# Patient Record
Sex: Female | Born: 1940 | Race: White | Hispanic: No | Marital: Married | Smoking: Never smoker
Health system: Southern US, Community
[De-identification: ages and names within clinical notes are randomized; demographics above are authoritative.]

## PROBLEM LIST (undated history)

## (undated) DIAGNOSIS — D649 Anemia, unspecified: Secondary | ICD-10-CM

## (undated) DIAGNOSIS — E785 Hyperlipidemia, unspecified: Secondary | ICD-10-CM

## (undated) DIAGNOSIS — I872 Venous insufficiency (chronic) (peripheral): Secondary | ICD-10-CM

## (undated) DIAGNOSIS — I10 Essential (primary) hypertension: Secondary | ICD-10-CM

## (undated) DIAGNOSIS — F329 Major depressive disorder, single episode, unspecified: Secondary | ICD-10-CM

## (undated) DIAGNOSIS — Z8619 Personal history of other infectious and parasitic diseases: Secondary | ICD-10-CM

## (undated) DIAGNOSIS — E039 Hypothyroidism, unspecified: Secondary | ICD-10-CM

## (undated) DIAGNOSIS — J449 Chronic obstructive pulmonary disease, unspecified: Secondary | ICD-10-CM

## (undated) DIAGNOSIS — M1990 Unspecified osteoarthritis, unspecified site: Secondary | ICD-10-CM

## (undated) DIAGNOSIS — M5430 Sciatica, unspecified side: Secondary | ICD-10-CM

## (undated) DIAGNOSIS — K219 Gastro-esophageal reflux disease without esophagitis: Secondary | ICD-10-CM

## (undated) DIAGNOSIS — G4733 Obstructive sleep apnea (adult) (pediatric): Secondary | ICD-10-CM

## (undated) DIAGNOSIS — I509 Heart failure, unspecified: Secondary | ICD-10-CM

## (undated) DIAGNOSIS — H409 Unspecified glaucoma: Secondary | ICD-10-CM

## (undated) DIAGNOSIS — E119 Type 2 diabetes mellitus without complications: Secondary | ICD-10-CM

## (undated) DIAGNOSIS — N189 Chronic kidney disease, unspecified: Secondary | ICD-10-CM

## (undated) DIAGNOSIS — J45909 Unspecified asthma, uncomplicated: Secondary | ICD-10-CM

## (undated) DIAGNOSIS — I499 Cardiac arrhythmia, unspecified: Secondary | ICD-10-CM

## (undated) DIAGNOSIS — F32A Depression, unspecified: Secondary | ICD-10-CM

## (undated) HISTORY — PX: BREAST BIOPSY: SHX20

---

## 2004-03-20 ENCOUNTER — Emergency Department: Payer: Self-pay | Admitting: Emergency Medicine

## 2006-12-31 ENCOUNTER — Emergency Department: Payer: Self-pay | Admitting: Emergency Medicine

## 2008-01-21 ENCOUNTER — Ambulatory Visit: Payer: Self-pay | Admitting: Surgery

## 2009-08-19 ENCOUNTER — Ambulatory Visit: Payer: Self-pay | Admitting: Ophthalmology

## 2011-08-01 ENCOUNTER — Ambulatory Visit: Payer: Self-pay | Admitting: Specialist

## 2013-01-16 ENCOUNTER — Ambulatory Visit: Payer: Self-pay | Admitting: Family

## 2014-02-19 ENCOUNTER — Ambulatory Visit: Payer: Self-pay | Admitting: Family

## 2014-02-24 ENCOUNTER — Ambulatory Visit: Payer: Self-pay | Admitting: Family

## 2014-06-24 DIAGNOSIS — Z8619 Personal history of other infectious and parasitic diseases: Secondary | ICD-10-CM

## 2014-06-24 HISTORY — DX: Personal history of other infectious and parasitic diseases: Z86.19

## 2014-06-30 ENCOUNTER — Ambulatory Visit: Payer: Self-pay | Admitting: Family Medicine

## 2014-07-15 ENCOUNTER — Inpatient Hospital Stay: Payer: Self-pay | Admitting: Internal Medicine

## 2014-07-18 LAB — CLOSTRIDIUM DIFFICILE(ARMC)

## 2014-07-19 LAB — STOOL CULTURE

## 2014-08-24 NOTE — Discharge Summary (Signed)
PATIENT NAME:  Laurie Ross, Laurie Ross MR#:  [id number] DATE OF BIRTH:  01-18-1941  DATE OF ADMISSION:  07/15/2014 DATE OF DISCHARGE:  07/17/2014  DISCHARGE DIAGNOSES:  1.  Clostridium difficile colitis. 2.  Intractable nausea and vomiting.   SECONDARY DIAGNOSES:   diabetes, hypertension, hyperlipidemia, history of chronic diastolic CHF, history of chronic kidney disease stage III, glaucoma, hypothyroidism, COPD, obesity, obstructive sleep apnea, hypothyroidism.   CONSULTATIONS: None.   PROCEDURES AND RADIOLOGY: None.   HISTORY AND SHORT HOSPITAL COURSE: The patient is a 74 year old female with the above-mentioned medical problems who was admitted for intractable nausea, vomiting and diarrhea.  Her diarrhea was known to be from C. difficile colitis for which she was being treated as an outpatient with oral Flagyl, but due to her nausea or vomiting she was not able to tolerate p.o. medicine and she was admitted. The patient was switched over to IV antibiotic and subsequently was switched to p.o. vancomycin as she was able to tolerate oral medications and diet. The patient has been tolerating diet. Her nausea and vomiting has been resolved and she is being discharged to rehab facility in stable condition. Her diarrhea seemed to be getting better. Her frequency is slowing down and so is her consistency.  The last consistency she reports is semiliquid to semisolid and she has had about 2 to 3 semiformed stools this morning. She is able to tolerate diet and is being discharged to rehab in stable condition.   VITAL SIGNS: On the date of discharge, her vital signs were as follows: Temperature 98.2, heart rate 86 per minute, respirations 20 per minute, blood pressure 124/64. She is saturating 91% on room air.   PERTINENT PHYSICAL EXAMINATION ON THE DATE OF DISCHARGE: CARDIOVASCULAR: S1, S2 normal. No murmurs, rubs, or gallop.  LUNGS: Clear to auscultation bilaterally. No wheezing, rales, rhonchi, crepitation.   ABDOMEN: Soft, benign.  NEUROLOGIC: Nonfocal examination.  All other physical examination remained at baseline.   DISCHARGE MEDICATIONS:   Medication Instructions  xalatan ophthalmic 0.005% solution  1 gtt drops to both eyes once a day (at bedtime)    cosopt ophthalmic solution  1 gtt drops to both eyes  2 times a day    aspirin 81 mg oral tablet ec  1 tab(s)  once a day    metformin 500 mg oral tablet  1 tab(s) orally 2 times a day    lasix 20 mg oral tablet  1  orally  every 2-3 days PRN     sertraline 100 mg oral tablet  1.5 tab(s) orally once a day (at bedtime)    lovastatin 20 mg oral tablet  1 tab(s) orally once a day (at bedtime)    hydrochlorothiazide 25 mg oral tablet  1 tab(s) orally once a day    hydrocortisone 1% cream  1 dose  2 times a day to ears and neck   nizoral 2% shampoo  1 app. twice weekly   novolin 70/30  Subcutaneously  every morning   novolin 70/30  Every evening   terconazole 0.4% vaginal cream  To belly button skin fold   vancomycin 250 mg/5 ml oral solution  10 milliliter(s) orally every 6 hours x 12 days    DISCHARGE DIET: Full liquid and advance as tolerated to a diabetic diet.   DISCHARGE INSTRUCTIONS AND FOLLOWUP:  The patient was instructed to follow up with her primary care physician Dr. Hinda Lenis in 1-2 weeks. She will need followup with [facility] in 2  to 3 weeks.   TOTAL TIME SPENT ON THIS PATIENT:  45 minutes.   Please note, the patient's blood pressure medication has been on hold including some other home medication as the patient's blood pressure has been running low to normal range, and she would not require this right now.  I have also held her Nexium considering her diarrhea which could be a side effect. She remains at high risk for readmission.   ____________________________ Ellamae SiaVipul S. Sherryll BurgerShah, MD vss:sp D: 07/17/2014 10:55:22 ET T: 07/17/2014 11:20:13 ET JOB#: [id number]  cc: Natajah Derderian S. Sherryll BurgerShah, MD, <Dictator> Dr. Hinda LenisSharon  [facility] Ellamae SiaVIPUL S [facility] MD ELECTRONICALLY SIGNED 07/18/2014 16:00

## 2014-08-24 NOTE — H&P (Signed)
PATIENT NAME:  Laurie Ross, Laurie Ross MR#:  [id number] DATE OF BIRTH:  27-Sep-1940  DATE OF ADMISSION:  07/15/2014  PRIMARY CARE PHYSICIAN: Dr. Lajuana Matte.   CHIEF COMPLAINT: Nausea, vomiting, and diarrhea.   HISTORY OF PRESENT ILLNESS: This is a 74 year old female who presents to the hospital due to nausea, vomiting, diarrhea getting worse over the past week. The patient was recently diagnosed with Clostridium difficile colitis and started on oral Flagyl about 2-3 days ago.  Despite taking the oral Flagyl, she continues to have significant nausea and vomiting and cannot keep anything down. She has been becoming incredibly weak and more dehydrated, and therefore is being admitted to the hospital for further treatment. The patient denies being on any recent antibiotics prior to being started on Flagyl recently. She denies having any recent urinary tract infection or bronchitis. She does complain of generalized abdominal pain, nausea, vomiting which is nonbloody and bilious in nature, and also diarrhea multiple times a day. She also admits to a low-grade fever of 100.6, but no other associated symptoms presently. Having failed oral Flagyl therapy, she is now being admitted for IV antibiotics and further treatment of her intractable nausea and vomiting.   REVIEW OF SYSTEMS: CONSTITUTIONAL: No documented fever. No weight gain, no weight loss.  EYES: No blurry or double vision.  ENT: No tinnitus. No postnasal drip. No redness of the oropharynx.  RESPIRATORY: No cough, no wheeze, no hemoptysis, no dyspnea.  CARDIOVASCULAR: No chest pain, no orthopnea, no palpitations, no syncope.  GASTROINTESTINAL: Positive nausea. Positive vomiting. Positive diarrhea.  Generalized abdominal pain. No melena or hematochezia.  GENITOURINARY: No dysuria or hematuria.  ENDOCRINE: No polyuria or nocturia.  No heat or cold intolerance.   HEMATOLOGY:  No anemia.  No bruising or bleeding.   INTEGUMENTARY: No rashes. No lesions.   MUSCULOSKELETAL: No arthritis, no swelling, no gout.  NEUROLOGIC: No numbness, tingling, or ataxia. No seizure activity.  PSYCHIATRIC: Positive depression. No anxiety. No ADD.   PAST MEDICAL HISTORY: Consistent with diabetes, hypertension, hyperlipidemia, history of chronic diastolic CHF, history of chronic kidney disease stage III, glaucoma, hypothyroidism, COPD, obesity, obstructive sleep apnea, hypothyroidism.   ALLERGIES: PENICILLIN WHICH CAUSES ANAPHYLAXIS.   SOCIAL HISTORY: No smoking, no alcohol abuse, no illicit drug abuse. [facility] in a [facility].   FAMILY HISTORY: Mother and father are both deceased. She cannot recall what her father died from. Mother died from complications of breast cancer.   CURRENT MEDICATIONS: Synthroid 50 mcg daily, Flonase 1 spray to each nostril daily, Nexium 40 mg daily, Enalapril 20 mg twice daily, atenolol 50 mg daily, Lasix 80 mg daily, aspirin 80 mg daily, Cosopt eye drops 1 drop to each eye b.i.d., Xalatan 0.005% ophthalmic solution to both eyes at bedtime, Tylenol Arthritis 2 tablets b.i.d. as needed, trazodone 100 mg at bedtime, Novolin 70/30 50 units in the morning and 30 units in the evening. Symbicort 160/4.5 two puffs twice daily, Boniva 150 mg monthly, calcium carbonate with vitamin D 1 tab b.i.d.,  Certrizine 5 mg at bedtime as needed, albuterol inhaler 2 puffs q. 4-6 hours as needed, albuterol nebulizer every 4 hours as needed, tramadol 50 mg q. 8 hours as needed, guaifenesin 5 mL q. 4-6 hours as needed, nystatin topical to be applied under the breasts and abdominal fold b.i.d. as needed, Zoloft 150 mg daily, Caduet 10/10 one tablet daily, oxygen 2 liters nasal cannula continuously, Aldactone 25 mg 2 tablets daily, iron sulfate 325 mg b.i.d., metronidazole 500 mg t.i.d., Zofran  4 mg q. 8 hours as needed.   PHYSICAL EXAMINATION PRESENTLY IS AS FOLLOWS: PATIENT'S VITAL SIGNS: Temperature 98.7, pulse 98, respirations 18, blood pressure 112/46, O2 sats  95% on room air.  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  HEENT EXAMINATION:  She is atraumatic, normocephalic. Extraocular muscles are intact. Pupils equal and reactive to light. Sclerae anicteric. No conjunctival injection. No pharyngeal erythema. Dry oral mucosa.  NECK: Supple. There is no jugular venous distention. No bruits, no lymphadenopathy, no thyromegaly.  HEART EXAMINATION: Regular rate and rhythm, tachycardic. No murmurs, no rubs, no clicks.  LUNGS: Clear to auscultation bilaterally. No rales or rhonchi. No wheezes. ABDOMEN: Soft, flat, nontender, nondistended. Has good bowel sounds. No hepatosplenomegaly appreciated.  EXTREMITIES: No evidence of any cyanosis, clubbing, or peripheral edema. Has +2 pedal and radial pulses bilaterally.  NEUROLOGICAL: The patient is alert, awake, and oriented x 3. No focal motor or sensory deficits appreciated bilaterally. SKIN EXAMINATION: Moist and warm with no rashes appreciated.  LYMPHATIC: There is no cervical or axillary lymphadenopathy.   LABORATORY EXAMINATION: Currently pending.   ASSESSMENT AND PLAN: This is a 74 year old female with history of chronic obstructive pulmonary disease, hypothyroidism, diabetes, glaucoma, chronic kidney disease stage III, chronic diastolic congestive heart failure, obstructive sleep apnea, hyperlipidemia, hypertension, obesity who presents to the hospital with nausea, vomiting, and diarrhea, and having failed oral Flagyl therapy for recently diagnosed Clostridium difficile colitis.   PROBLEMS: 1.  Clostridium difficile colitis. This was likely cause of patient's acute diarrhea, nausea, vomiting. The patient has failed outpatient oral Flagyl therapy. She continues to have intractable nausea and vomiting.  For now will admit the patient and start the patient on IV Flagyl.  Check stool for Clostridium difficile and comprehensive culture again just to confirm it.  Continue supportive  care, follow her clinically.  2.  Intractable nausea and vomiting.  This is secondary to  Clostridium difficile colitis. I will place the patient on IV antiemetics and place her on a clear liquid diet.  Give her some gentle IV fluids.  3.  Diabetes.  Since patient is having nausea, vomiting, is going to be a clear liquid diet. I will hold her scheduled insulin for now.  Place her on sliding scale insulin coverage. 4.  Chronic obstructive pulmonary disease, no acute exacerbation. Continue Symbicort and p.r.n. DuoNebs.  5.  Glaucoma.  Continue Xalatan and Cosopt eye drops.  6.  Hypertension. The patient's blood pressure is a little bit on the low side; therefore, I will hold her atenolol, Lasix, Enalapril for now.  7.  Hyperlipidemia. Continue atorvastatin. 8.  Continue Nexium.  9.  Depression.  Continue with Zoloft.   CODE STATUS: The patient is a DO NOT INTUBATE/DO NOT RESUSCITATE.   Time Spent on admission is 50 minutes.    ____________________________ Rolly PancakeVivek J. Cherlynn KaiserSainani, MD vjs:sp D: 07/15/2014 15:08:27 ET T: 07/15/2014 15:30:02 ET JOB#: [id number]  cc: Rolly PancakeVivek J. Cherlynn KaiserSainani, MD, <Dictator> [place] SirenVIVEK J Hamlin Devine MD ELECTRONICALLY SIGNED 07/15/2014 17:32

## 2014-09-24 ENCOUNTER — Encounter: Payer: Self-pay | Admitting: *Deleted

## 2014-09-24 ENCOUNTER — Inpatient Hospital Stay
Admission: EM | Admit: 2014-09-24 | Discharge: 2014-09-28 | DRG: 372 | Disposition: A | Discharge status: Home / Self Care | Payer: Medicare (Managed Care) | Attending: Internal Medicine | Admitting: Internal Medicine

## 2014-09-24 DIAGNOSIS — E860 Dehydration: Secondary | ICD-10-CM | POA: Diagnosis present

## 2014-09-24 DIAGNOSIS — K219 Gastro-esophageal reflux disease without esophagitis: Secondary | ICD-10-CM | POA: Diagnosis present

## 2014-09-24 DIAGNOSIS — E119 Type 2 diabetes mellitus without complications: Secondary | ICD-10-CM | POA: Diagnosis present

## 2014-09-24 DIAGNOSIS — G4733 Obstructive sleep apnea (adult) (pediatric): Secondary | ICD-10-CM | POA: Diagnosis present

## 2014-09-24 DIAGNOSIS — E875 Hyperkalemia: Secondary | ICD-10-CM

## 2014-09-24 DIAGNOSIS — I129 Hypertensive chronic kidney disease with stage 1 through stage 4 chronic kidney disease, or unspecified chronic kidney disease: Secondary | ICD-10-CM | POA: Diagnosis present

## 2014-09-24 DIAGNOSIS — A047 Enterocolitis due to Clostridium difficile: Secondary | ICD-10-CM | POA: Diagnosis not present

## 2014-09-24 DIAGNOSIS — Z79899 Other long term (current) drug therapy: Secondary | ICD-10-CM

## 2014-09-24 DIAGNOSIS — N183 Chronic kidney disease, stage 3 (moderate): Secondary | ICD-10-CM | POA: Diagnosis present

## 2014-09-24 DIAGNOSIS — N179 Acute kidney failure, unspecified: Secondary | ICD-10-CM | POA: Diagnosis present

## 2014-09-24 DIAGNOSIS — N189 Chronic kidney disease, unspecified: Secondary | ICD-10-CM

## 2014-09-24 DIAGNOSIS — R112 Nausea with vomiting, unspecified: Secondary | ICD-10-CM

## 2014-09-24 DIAGNOSIS — Z7982 Long term (current) use of aspirin: Secondary | ICD-10-CM | POA: Diagnosis not present

## 2014-09-24 DIAGNOSIS — R197 Diarrhea, unspecified: Secondary | ICD-10-CM | POA: Diagnosis present

## 2014-09-24 DIAGNOSIS — I5032 Chronic diastolic (congestive) heart failure: Secondary | ICD-10-CM | POA: Diagnosis present

## 2014-09-24 DIAGNOSIS — J449 Chronic obstructive pulmonary disease, unspecified: Secondary | ICD-10-CM | POA: Diagnosis present

## 2014-09-24 HISTORY — DX: Essential (primary) hypertension: I10

## 2014-09-24 HISTORY — DX: Personal history of other infectious and parasitic diseases: Z86.19

## 2014-09-24 HISTORY — DX: Obstructive sleep apnea (adult) (pediatric): G47.33

## 2014-09-24 HISTORY — DX: Heart failure, unspecified: I50.9

## 2014-09-24 HISTORY — DX: Chronic obstructive pulmonary disease, unspecified: J44.9

## 2014-09-24 HISTORY — DX: Gastro-esophageal reflux disease without esophagitis: K21.9

## 2014-09-24 HISTORY — DX: Type 2 diabetes mellitus without complications: E11.9

## 2014-09-24 LAB — CBC WITH DIFFERENTIAL/PLATELET
BASOS ABS: 0 10*3/uL (ref 0–0.1)
BASOS PCT: 0 %
Eosinophils Absolute: 0 10*3/uL (ref 0–0.7)
Eosinophils Relative: 0 %
HEMATOCRIT: 36.2 % (ref 35.0–47.0)
Hemoglobin: 11.8 g/dL — ABNORMAL LOW (ref 12.0–16.0)
LYMPHS ABS: 0.6 10*3/uL — AB (ref 1.0–3.6)
Lymphocytes Relative: 4 %
MCH: 31.5 pg (ref 26.0–34.0)
MCHC: 32.7 g/dL (ref 32.0–36.0)
MCV: 96.3 fL (ref 80.0–100.0)
MONO ABS: 0.5 10*3/uL (ref 0.2–0.9)
Monocytes Relative: 3 %
NEUTROS ABS: 12.5 10*3/uL — AB (ref 1.4–6.5)
Neutrophils Relative %: 93 %
PLATELETS: 160 10*3/uL (ref 150–440)
RBC: 3.76 MIL/uL — ABNORMAL LOW (ref 3.80–5.20)
RDW: 15.2 % — AB (ref 11.5–14.5)
WBC: 13.6 10*3/uL — ABNORMAL HIGH (ref 3.6–11.0)

## 2014-09-24 LAB — URINALYSIS COMPLETE WITH MICROSCOPIC (ARMC ONLY)
Bilirubin Urine: NEGATIVE
Glucose, UA: NEGATIVE mg/dL
KETONES UR: NEGATIVE mg/dL
NITRITE: POSITIVE — AB
PH: 5 (ref 5.0–8.0)
Protein, ur: NEGATIVE mg/dL
SPECIFIC GRAVITY, URINE: 1.011 (ref 1.005–1.030)

## 2014-09-24 LAB — TROPONIN I: Troponin I: <0.03 ng/mL (ref <0.031)

## 2014-09-24 LAB — MAGNESIUM: MAGNESIUM: 1.9 mg/dL (ref 1.7–2.4)

## 2014-09-24 LAB — BASIC METABOLIC PANEL
ANION GAP: 7 (ref 5–15)
BUN: 65 mg/dL — ABNORMAL HIGH (ref 6–20)
CALCIUM: 10.2 mg/dL (ref 8.9–10.3)
CHLORIDE: 114 mmol/L — AB (ref 101–111)
CO2: 16 mmol/L — ABNORMAL LOW (ref 22–32)
Creatinine, Ser: 2.12 mg/dL — ABNORMAL HIGH (ref 0.44–1.00)
GFR calc non Af Amer: 22 mL/min — ABNORMAL LOW (ref >60)
GFR, EST AFRICAN AMERICAN: 25 mL/min — AB (ref >60)
Glucose, Bld: 227 mg/dL — ABNORMAL HIGH (ref 65–99)
POTASSIUM: 6.1 mmol/L — AB (ref 3.5–5.1)
Sodium: 137 mmol/L (ref 135–145)

## 2014-09-24 LAB — LIPASE, BLOOD: Lipase: 42 U/L (ref 22–51)

## 2014-09-24 LAB — POTASSIUM: Potassium: 6.4 mmol/L — ABNORMAL HIGH (ref 3.5–5.1)

## 2014-09-24 MED ORDER — SODIUM CHLORIDE 0.9 % IV BOLUS (SEPSIS)
500.0000 mL | INTRAVENOUS | Status: AC
  Administered 2014-09-24: 500 mL via INTRAVENOUS

## 2014-09-24 MED ORDER — ONDANSETRON HCL 4 MG PO TABS: 4.0000 mg | ORAL_TABLET | Freq: Four times a day (QID) | ORAL | Status: DC | PRN

## 2014-09-24 MED ORDER — ENOXAPARIN SODIUM 30 MG/0.3ML ~~LOC~~ SOLN
30.0000 mg | SUBCUTANEOUS | Status: DC
  Administered 2014-09-24 – 2014-09-27 (×4): 30 mg via SUBCUTANEOUS
  Filled 2014-09-24 (×3): qty 0.3

## 2014-09-24 MED ORDER — SERTRALINE HCL 50 MG PO TABS
150.0000 mg | ORAL_TABLET | Freq: Every day | ORAL | Status: DC
  Administered 2014-09-25 – 2014-09-28 (×4): 150 mg via ORAL
  Filled 2014-09-24 (×4): qty 3

## 2014-09-24 MED ORDER — ACETAMINOPHEN 325 MG PO TABS: 625.0000 mg | ORAL_TABLET | Freq: Four times a day (QID) | ORAL | Status: DC | PRN

## 2014-09-24 MED ORDER — FAMOTIDINE 20 MG PO TABS
20.0000 mg | ORAL_TABLET | Freq: Two times a day (BID) | ORAL | Status: DC
  Administered 2014-09-24 – 2014-09-28 (×8): 20 mg via ORAL
  Filled 2014-09-24 (×8): qty 1

## 2014-09-24 MED ORDER — DEXTROSE 50 % IV SOLN
1.0000 | Freq: Once | INTRAVENOUS | Status: AC
  Administered 2014-09-24: 50 mL via INTRAVENOUS

## 2014-09-24 MED ORDER — SODIUM CHLORIDE 0.9 % IV SOLN
1.0000 g | Freq: Once | INTRAVENOUS | Status: AC
  Administered 2014-09-24: 1 g via INTRAVENOUS

## 2014-09-24 MED ORDER — SODIUM CHLORIDE 0.9 % IJ SOLN
3.0000 mL | Freq: Two times a day (BID) | INTRAMUSCULAR | Status: DC
  Administered 2014-09-27: 3 mL via INTRAVENOUS

## 2014-09-24 MED ORDER — LEVOTHYROXINE SODIUM 50 MCG PO TABS
50.0000 ug | ORAL_TABLET | Freq: Every day | ORAL | Status: DC
  Administered 2014-09-25 – 2014-09-28 (×4): 50 ug via ORAL
  Filled 2014-09-24 (×4): qty 1

## 2014-09-24 MED ORDER — ATORVASTATIN CALCIUM 20 MG PO TABS
10.0000 mg | ORAL_TABLET | Freq: Every day | ORAL | Status: DC
  Administered 2014-09-24 – 2014-09-27 (×4): 10 mg via ORAL
  Filled 2014-09-24: qty 1
  Filled 2014-09-24: qty 2
  Filled 2014-09-24 (×2): qty 1

## 2014-09-24 MED ORDER — ASPIRIN EC 81 MG PO TBEC
81.0000 mg | DELAYED_RELEASE_TABLET | Freq: Every day | ORAL | Status: DC
Start: 2014-09-24 — End: 2014-09-28
  Administered 2014-09-25 – 2014-09-28 (×4): 81 mg via ORAL
  Filled 2014-09-24 (×4): qty 1

## 2014-09-24 MED ORDER — DEXTROSE 50 % IV SOLN
INTRAVENOUS | Status: AC
  Filled 2014-09-24: qty 50

## 2014-09-24 MED ORDER — ACETAMINOPHEN 325 MG PO TABS: 650.0000 mg | ORAL_TABLET | Freq: Four times a day (QID) | ORAL | Status: DC | PRN

## 2014-09-24 MED ORDER — ENOXAPARIN SODIUM 100 MG/ML ~~LOC~~ SOLN
SUBCUTANEOUS | Status: AC
  Filled 2014-09-24: qty 1

## 2014-09-24 MED ORDER — FERROUS SULFATE 325 (65 FE) MG PO TABS
325.0000 mg | ORAL_TABLET | Freq: Every day | ORAL | Status: DC
  Administered 2014-09-24 – 2014-09-27 (×4): 325 mg via ORAL
  Filled 2014-09-24 (×4): qty 1

## 2014-09-24 MED ORDER — SODIUM CHLORIDE 0.9 % IV BOLUS (SEPSIS): 500.0000 mL | Freq: Once | INTRAVENOUS | Status: DC

## 2014-09-24 MED ORDER — TRAZODONE HCL 100 MG PO TABS
100.0000 mg | ORAL_TABLET | Freq: Every day | ORAL | Status: DC
  Administered 2014-09-24 – 2014-09-27 (×3): 100 mg via ORAL
  Filled 2014-09-24 (×3): qty 1

## 2014-09-24 MED ORDER — HYDROCODONE-ACETAMINOPHEN 5-325 MG PO TABS
1.0000 | ORAL_TABLET | ORAL | Status: DC | PRN
  Administered 2014-09-24: 1 via ORAL
  Filled 2014-09-24: qty 1

## 2014-09-24 MED ORDER — CALCIUM GLUCONATE 10 % IV SOLN
INTRAVENOUS | Status: AC
  Filled 2014-09-24: qty 10

## 2014-09-24 MED ORDER — METRONIDAZOLE 500 MG PO TABS
500.0000 mg | ORAL_TABLET | Freq: Three times a day (TID) | ORAL | Status: DC
  Administered 2014-09-24 – 2014-09-25 (×2): 500 mg via ORAL
  Filled 2014-09-24 (×2): qty 1

## 2014-09-24 MED ORDER — INSULIN ASPART 100 UNIT/ML ~~LOC~~ SOLN
SUBCUTANEOUS | Status: AC
  Filled 2014-09-24: qty 5

## 2014-09-24 MED ORDER — AMLODIPINE-ATORVASTATIN 5-10 MG PO TABS: 1.0000 | ORAL_TABLET | Freq: Every day | ORAL | Status: DC

## 2014-09-24 MED ORDER — SODIUM CHLORIDE 0.9 % IV SOLN
INTRAVENOUS | Status: DC
  Administered 2014-09-24 (×2): via INTRAVENOUS

## 2014-09-24 MED ORDER — ATENOLOL 50 MG PO TABS
50.0000 mg | ORAL_TABLET | Freq: Every day | ORAL | Status: DC
  Administered 2014-09-25 – 2014-09-28 (×3): 50 mg via ORAL
  Filled 2014-09-24 (×3): qty 1

## 2014-09-24 MED ORDER — AMLODIPINE BESYLATE 5 MG PO TABS
5.0000 mg | ORAL_TABLET | Freq: Every day | ORAL | Status: DC
  Administered 2014-09-24 – 2014-09-27 (×4): 5 mg via ORAL
  Filled 2014-09-24 (×4): qty 1

## 2014-09-24 MED ORDER — ACETAMINOPHEN 650 MG RE SUPP: 650.0000 mg | Freq: Four times a day (QID) | RECTAL | Status: DC | PRN

## 2014-09-24 MED ORDER — ONDANSETRON HCL 4 MG/2ML IJ SOLN
4.0000 mg | Freq: Four times a day (QID) | INTRAMUSCULAR | Status: DC | PRN
  Administered 2014-09-24: 4 mg via INTRAVENOUS
  Filled 2014-09-24: qty 2

## 2014-09-24 MED ORDER — INSULIN ASPART 100 UNIT/ML ~~LOC~~ SOLN
5.0000 [IU] | Freq: Once | SUBCUTANEOUS | Status: AC
  Administered 2014-09-24: 5 [IU] via INTRAVENOUS

## 2014-09-24 NOTE — ED Notes (Signed)
Pt here with c/o diarrhea episodes x 2 months.  Pt denies any pain.  Pt advises she has taken several abx for same per her PCP.

## 2014-09-24 NOTE — ED Notes (Addendum)
Pt contact is [name] [phone] pt's daughter.

## 2014-09-24 NOTE — H&P (Signed)
[facility] [place] [facility]   PATIENT NAME: Laurie Ross Frappier    MR#:  [id number]  DATE OF BIRTH:  04/13/1941  DATE OF ADMISSION:  09/24/2014  PRIMARY CARE PHYSICIAN: No primary care provider on file.   REQUESTING/REFERRING PHYSICIAN: Dr. York CeriseForbach  CHIEF COMPLAINT:   Chief Complaint  Patient presents with  . Diarrhea    HISTORY OF PRESENT ILLNESS:  Laurie Ross Jasek  is a 74 y.o. female with a known history of DM,HTN, h/o C diff here with on going diarrhea. Found to have ARF and Hyperkalemia in ED. Has had on and off diarrhea for past 2 months, treated multiple times with PO abx. Patient was diarrhea free for 2 weeks and then started having diarrhea 5 days back. Had episodes of vomiting yesterday and today. No abdominal pain. No blood in vomiting or stool. No other antibiotics used except Flagyl and vancomycin as prescribed by her physician. Patient's primary care physician Dr. Victory Dakiniley was available in patient's room and provided history in addition to history from patient.  PAST MEDICAL HISTORY:   Past Medical History  Diagnosis Date  . CHF (congestive heart failure)   . Diabetes mellitus without complication   . GERD (gastroesophageal reflux disease)   . Hypertension   . COPD (chronic obstructive pulmonary disease)     occasional home O2 use  . Obstructive sleep apnea   . Hx of Clostridium difficile infection March 2016    PAST SURGICAL HISTORY:  History reviewed. No pertinent past surgical history.  SOCIAL HISTORY:   History  Substance Use Topics  . Smoking status: Never Smoker   . Smokeless tobacco: Never Used  . Alcohol Use: No    FAMILY HISTORY:   Family History  Problem Relation Age of Onset  . Family history unknown: Yes    DRUG ALLERGIES:   Allergies  Allergen Reactions  . Penicillins Itching    REVIEW OF SYSTEMS:   Review of Systems  Constitutional: Positive for malaise/fatigue. Negative for fever, chills and weight  loss.  HENT: Negative for hearing loss and nosebleeds.   Eyes: Negative for blurred vision, double vision and pain.  Respiratory: Negative for cough, hemoptysis, sputum production, shortness of breath and wheezing.   Cardiovascular: Negative for chest pain, palpitations, orthopnea and leg swelling.  Gastrointestinal: Positive for diarrhea. Negative for nausea, vomiting, abdominal pain and constipation.  Genitourinary: Negative for dysuria and hematuria.  Musculoskeletal: Negative for myalgias, back pain and falls.  Skin: Negative for rash.  Neurological: Positive for weakness. Negative for dizziness, tremors, sensory change, speech change, focal weakness, seizures and headaches.  Endo/Heme/Allergies: Does not bruise/bleed easily.  Psychiatric/Behavioral: Negative for depression and memory loss. The patient is not nervous/anxious.     MEDICATIONS AT HOME:   Prior to Admission medications   Medication Sig Start Date End Date Taking? Authorizing Provider  acetaminophen (TYLENOL) 650 MG CR tablet Take 1,300 mg by mouth 2 (two) times daily.   Yes Historical Provider, MD  amLODipine-atorvastatin (CADUET) 5-10 MG per tablet Take 1 tablet by mouth at bedtime.   Yes Historical Provider, MD  aspirin EC 81 MG tablet Take 81 mg by mouth daily.   Yes Historical Provider, MD  atenolol (TENORMIN) 50 MG tablet Take 50 mg by mouth daily.   Yes Historical Provider, MD  CALCIUM-VITAMIN D PO Take 1 tablet by mouth 2 (two) times daily. osteoporosis   Yes Historical Provider, MD  enalapril (VASOTEC) 20 MG tablet Take 20 mg by mouth 2 (two)  times daily.   Yes Historical Provider, MD  ferrous sulfate 325 (65 FE) MG tablet Take 325 mg by mouth at bedtime.   Yes Historical Provider, MD  furosemide (LASIX) 80 MG tablet Take 80 mg by mouth daily.   Yes Historical Provider, MD  levothyroxine (SYNTHROID, LEVOTHROID) 50 MCG tablet Take 50 mcg by mouth daily.   Yes Historical Provider, MD  ranitidine (ZANTAC) 150 MG  tablet Take 150 mg by mouth 2 (two) times daily.   Yes Historical Provider, MD  sertraline (ZOLOFT) 100 MG tablet Take 150 mg by mouth daily.   Yes Historical Provider, MD  spironolactone (ALDACTONE) 25 MG tablet Take 50 mg by mouth daily. For fluid   Yes Historical Provider, MD  traZODone (DESYREL) 100 MG tablet Take 100 mg by mouth at bedtime.   Yes Historical Provider, MD      VITAL SIGNS:  Blood pressure 144/64, pulse 85, temperature 97.9 F (36.6 C), temperature source Oral, resp. rate 16, height  (1.549 m), weight 89.359 kg (197 lb), SpO2 97 %.  PHYSICAL EXAMINATION:  Physical Exam  GENERAL:  74 y.o.-year-old patient lying in the bed with no acute distress.  EYES: Pupils equal, round, reactive to light and accommodation. No scleral icterus. Extraocular muscles intact.  HEENT: Head atraumatic, normocephalic. Oropharynx and nasopharynx clear. No oropharyngeal erythema, moist oral mucosa  NECK:  Supple, no jugular venous distention. No thyroid enlargement, no tenderness.  LUNGS: Normal breath sounds bilaterally, no wheezing, rales, rhonchi. No use of accessory muscles of respiration.  CARDIOVASCULAR: S1, S2 normal. No murmurs, rubs, or gallops.  ABDOMEN: Soft, nontender, nondistended. Bowel sounds present. No organomegaly or mass.  EXTREMITIES: No pedal edema, cyanosis, or clubbing. + 2 pedal & radial pulses b/l.   NEUROLOGIC: Cranial nerves II through XII are intact. No focal Motor or sensory deficits appreciated b/l PSYCHIATRIC: The patient is alert and oriented x 3. Good affect.  SKIN: No obvious rash, lesion, or ulcer.   LABORATORY PANEL:   CBC  Recent Labs Lab 09/24/14 1348  WBC 13.6*  HGB 11.8*  HCT 36.2  PLT 160   ------------------------------------------------------------------------------------------------------------------  Chemistries   Recent Labs Lab 09/24/14 1348 09/24/14 1505  NA 134*  --   K 6.8* 6.4*  CL 112*  --   CO2 15*  --   GLUCOSE 239*   --   BUN 66*  --   CREATININE 2.36*  --   CALCIUM 9.8  --   MG 1.9  --   AST 15  --   ALT 14  --   ALKPHOS 55  --   BILITOT 0.6  --    ------------------------------------------------------------------------------------------------------------------  Cardiac Enzymes  Recent Labs Lab 09/24/14 1348  TROPONINI <0.03   ------------------------------------------------------------------------------------------------------------------  RADIOLOGY:  No results found.   IMPRESSION AND PLAN:   * Acute renal failure over CKD3 Likely prerenal from dehydration due to chronic diarrhea. Patient has continued to take her Lasix. Hold Lasix. We will bolus fluids and put her on continuous normal saline. This should also help her hyperkalemia. Hold spironolactone and enalapril.  * Hyperkalemia Due to acute renal failure and being on spinal lactone and enalapril. Medications held. Bicarbonate push, insulin, glucose, calcium chloride.  * Diarrhea - C diff? Patient has history of Clostridium difficile. Has been treated in the past with her last hospital admission being in March 2016. Has gone through multiple rounds of antibiotics through her primary care physician according to the patient. At this time Clostridium difficile labs  will be sent. Start on oral Flagyl while we wait for the results. She does have leukocytosis with left shift. Afebrile.  * HTN - Aldosterone and enalapril held. Continue beta blocker and amlodipine.  * DM2 Sliding scale insulin and diabetic diet  * Chronic diastolic chf No signs of fluid overload. Hold lasix Due to acute renal failure.    All the records are reviewed and case discussed with ED provider. Management plans discussed with the patient, family and they are in agreement.  CODE STATUS: FULL CODE  TOTAL TIME TAKING CARE OF THIS PATIENT: 45 minutes.    Milagros Loll R M.D on 09/24/2014 at 5:33 PM  Between 7am to 6pm - Pager - [phone]  After 6pm  go to [email or web address] - password [email or web address] [email or web address]  [facility]  Office  [phone]  CC: Primary care physician; No primary care provider on file.

## 2014-09-24 NOTE — ED Notes (Signed)
Pt here with c/o episodes of diarrhea x 2 months.  Pt advises she seen her PCP who sent her to ED.  Pt advises her PCP has been giving her abx for her diarrhea but it keeps coming back.

## 2014-09-24 NOTE — ED Provider Notes (Signed)
[facility] Emergency Department Provider Note  ____________________________________________  Time seen: Approximately 12:44 PM  I have reviewed the triage vital signs and the nursing notes.   HISTORY  Chief Complaint Diarrhea    HPI Laurie Ross is a 74 y.o. female with a history of C. difficile colitis diagnosed about 2-1/2 months ago requiring hospitalization who presents with about 5 days of recurrent vomiting and diarrhea.  Reportedly she got better after her hospitalization but over the last 5 days has had 3-4 episodes of diarrhea a day (normal for her is once a day) when at times the stool "just falls out ".  It is a little bit better today but she did have some vomiting this morning as well.  She called her primary care doctor this morning who recommended that she come to the emergency department.  She is in no acute distress and denies abdominal pain, chest pain, shortness of breath, and dysuria.  The symptoms, particularly the diarrhea, were severe several days ago, but are actually better now.  Past Medical History  Diagnosis Date  . CHF (congestive heart failure)   . Diabetes mellitus without complication   . GERD (gastroesophageal reflux disease)   . Hypertension   . COPD (chronic obstructive pulmonary disease)     occasional home O2 use  . Obstructive sleep apnea   . Hx of Clostridium difficile infection March 2016    There are no active problems to display for this patient.   History reviewed. No pertinent past surgical history.  No current outpatient prescriptions on file.  Allergies Review of patient's allergies indicates not on file.  History reviewed. No pertinent family history.  Social History History  Substance Use Topics  . Smoking status: Never Smoker   . Smokeless tobacco: Never Used  . Alcohol Use: No    Review of Systems Constitutional: No fever/chills Eyes: No visual changes. ENT: No sore  throat. Cardiovascular: Denies chest pain. Respiratory: Denies shortness of breath. Gastrointestinal: No abdominal pain.  Occasional nausea and vomiting, frequent episodes of diarrhea but less today.  No constipation. Genitourinary: Negative for dysuria. Musculoskeletal: Negative for back pain. Skin: Negative for rash. Neurological: Negative for headaches, focal weakness or numbness.  10-point ROS otherwise negative.  ____________________________________________   PHYSICAL EXAM:  VITAL SIGNS: ED Triage Vitals  Enc Vitals Group     BP 09/24/14 1224 124/40 mmHg     Pulse --      Resp 09/24/14 1224 18     Temp 09/24/14 1224 97.9 F (36.6 C)     Temp Source 09/24/14 1224 Oral     SpO2 09/24/14 1224 96 %     Weight 09/24/14 1224 197 lb (89.359 kg)     Height 09/24/14 1224  (1.549 m)     Head Cir --      Peak Flow --      Pain Score 09/24/14 1224 0     Pain Loc --      Pain Edu? --      Excl. in GC? --     Constitutional: Alert and oriented. Well appearing and in no acute distress. Eyes: Conjunctivae are normal. PERRL. EOMI. Head: Atraumatic. Nose: No congestion/rhinnorhea. Mouth/Throat: Mucous membranes are moist.  Oropharynx non-erythematous. Neck: No stridor.   Cardiovascular: Normal rate, regular rhythm. Grossly normal heart sounds.  Good peripheral circulation. Respiratory: Normal respiratory effort.  No retractions. Lungs CTAB. Gastrointestinal: Soft and nontender. No distention. No abdominal bruits. No CVA tenderness. Musculoskeletal:  No lower extremity tenderness nor edema.  No joint effusions. Neurologic:  Normal speech and language. No gross focal neurologic deficits are appreciated. Speech is normal. No gait instability. Skin:  Skin is warm, dry and intact. No rash noted. Psychiatric: Mood and affect are normal. Speech and behavior are normal.  ____________________________________________   LABS (all labs ordered are listed, but only abnormal results are  displayed)  Labs Reviewed  C DIFFICILE QUICK SCAN W PCR REFLEX ([facility] ONLY)  COMPREHENSIVE METABOLIC PANEL  LIPASE, BLOOD  CBC WITH DIFFERENTIAL/PLATELET  TROPONIN I  MAGNESIUM  URINALYSIS COMPLETEWITH MICROSCOPIC ([facility] ONLY)   ____________________________________________  EKG  ED ECG REPORT I, Kathlyn Leachman, the attending physician, personally viewed and interpreted this ECG.  Date: 09/24/2014 EKG Time: 13:51 Rate: 84 Rhythm: normal sinus rhythm QRS Axis: normal Intervals: normal ST/T Wave abnormalities: normal Conduction Disutrbances: none Narrative Interpretation: unremarkable  ____________________________________________  RADIOLOGY  Not indicated  ____________________________________________  INITIAL IMPRESSION / ASSESSMENT AND PLAN / ED COURSE  Pertinent labs & imaging results that were available during my care of the patient were reviewed by me and considered in my medical decision making (see chart for details).  Given the patient's history of recent community-acquired C. difficile colitis, I will place the patient on enteric precautions and check C. difficile test here in the ED.  We will obtain standard lab work and a catheterized urine sample.  However, the patient states herself that she feels better than she has lost several days and has normal vital signs.  She has no abdominal pain or tenderness to palpation.  If her C. difficile is negative and she is able to take by mouth, she will likely be able to follow-up as an outpatient with her PCP.  There is no indication for emergent imaging at this time.  ----------------------------------------- 2:59 PM on 09/24/2014 -----------------------------------------  Patient's potassium was 6.8, reportedly with no hemolysis.  Given how unexpected this is, and with no EKG changes, I will repeat the potassium to make sure it is not an erroneous lab value.  We are sending a new sample now.  The patient is sleeping  comfortably in no acute distress.   (Note that documentation was delayed due to multiple ED patients requiring immediate care.)   The patient's repeat potassium was still 6.4 and based on prior labs &'s that [facility], she also has acute on chronic renal failure with a creatinine of approximately 2.4 today where her baseline prior was 1.4.  Her BUN is 66.  I am treating her with 500 mL of normal saline, regular insulin 5 units IV, 1 amp of D50, and will admit for further management.  She has not yet been able to provide a stool sample.  ____________________________________________  FINAL CLINICAL IMPRESSION(S) / ED DIAGNOSES  Final diagnoses:  Non-intractable vomiting with nausea, vomiting of unspecified type  Diarrhea  Acute hyperkalemia  Acute on chronic renal failure      NEW MEDICATIONS STARTED DURING THIS VISIT:  New Prescriptions   No medications on file     Loleta Roseory Eriel Doyon, MD 09/24/14 2005

## 2014-09-25 DIAGNOSIS — A047 Enterocolitis due to Clostridium difficile: Secondary | ICD-10-CM

## 2014-09-25 LAB — CBC
HCT: 34.1 % — ABNORMAL LOW (ref 35.0–47.0)
HEMOGLOBIN: 11.2 g/dL — AB (ref 12.0–16.0)
MCH: 31.5 pg (ref 26.0–34.0)
MCHC: 32.9 g/dL (ref 32.0–36.0)
MCV: 95.9 fL (ref 80.0–100.0)
Platelets: 141 10*3/uL — ABNORMAL LOW (ref 150–440)
RBC: 3.56 MIL/uL — AB (ref 3.80–5.20)
RDW: 15.2 % — ABNORMAL HIGH (ref 11.5–14.5)
WBC: 8.9 10*3/uL (ref 3.6–11.0)

## 2014-09-25 LAB — COMPREHENSIVE METABOLIC PANEL
ALT: 14 U/L (ref 14–54)
AST: 15 U/L (ref 15–41)
Albumin: 4 g/dL (ref 3.5–5.0)
Alkaline Phosphatase: 55 U/L (ref 38–126)
Anion gap: 7 (ref 5–15)
BUN: 66 mg/dL — AB (ref 6–20)
CHLORIDE: 112 mmol/L — AB (ref 101–111)
CO2: 15 mmol/L — ABNORMAL LOW (ref 22–32)
Calcium: 9.8 mg/dL (ref 8.9–10.3)
Creatinine, Ser: 2.36 mg/dL — ABNORMAL HIGH (ref 0.44–1.00)
GFR calc Af Amer: 22 mL/min — ABNORMAL LOW (ref >60)
GFR, EST NON AFRICAN AMERICAN: 19 mL/min — AB (ref >60)
Glucose, Bld: 239 mg/dL — ABNORMAL HIGH (ref 65–99)
Potassium: 6.8 mmol/L (ref 3.5–5.1)
Sodium: 134 mmol/L — ABNORMAL LOW (ref 135–145)
Total Bilirubin: 0.6 mg/dL (ref 0.3–1.2)
Total Protein: 7.5 g/dL (ref 6.5–8.1)

## 2014-09-25 LAB — BASIC METABOLIC PANEL
ANION GAP: 5 (ref 5–15)
Anion gap: 3 — ABNORMAL LOW (ref 5–15)
BUN: 61 mg/dL — AB (ref 6–20)
BUN: 57 mg/dL — AB (ref 6–20)
CALCIUM: 9.7 mg/dL (ref 8.9–10.3)
CHLORIDE: 118 mmol/L — AB (ref 101–111)
CO2: 18 mmol/L — ABNORMAL LOW (ref 22–32)
CO2: 15 mmol/L — AB (ref 22–32)
CREATININE: 1.64 mg/dL — AB (ref 0.44–1.00)
Calcium: 9.2 mg/dL (ref 8.9–10.3)
Chloride: 118 mmol/L — ABNORMAL HIGH (ref 101–111)
Creatinine, Ser: 1.57 mg/dL — ABNORMAL HIGH (ref 0.44–1.00)
GFR calc Af Amer: 34 mL/min — ABNORMAL LOW (ref >60)
GFR calc non Af Amer: 31 mL/min — ABNORMAL LOW (ref >60)
GFR, EST AFRICAN AMERICAN: 36 mL/min — AB (ref >60)
GFR, EST NON AFRICAN AMERICAN: 30 mL/min — AB (ref >60)
GLUCOSE: 164 mg/dL — AB (ref 65–99)
Glucose, Bld: 148 mg/dL — ABNORMAL HIGH (ref 65–99)
POTASSIUM: 5.4 mmol/L — AB (ref 3.5–5.1)
Potassium: 5.7 mmol/L — ABNORMAL HIGH (ref 3.5–5.1)
SODIUM: 139 mmol/L (ref 135–145)
SODIUM: 138 mmol/L (ref 135–145)

## 2014-09-25 LAB — C DIFFICILE QUICK SCREEN W PCR REFLEX
C DIFFICLE (CDIFF) ANTIGEN: POSITIVE
C Diff toxin: NEGATIVE

## 2014-09-25 LAB — GLUCOSE, CAPILLARY
Glucose-Capillary: 153 mg/dL — ABNORMAL HIGH (ref 65–99)
Glucose-Capillary: 122 mg/dL — ABNORMAL HIGH (ref 65–99)

## 2014-09-25 MED ORDER — SODIUM BICARBONATE 8.4 % IV SOLN
INTRAVENOUS | Status: DC
  Administered 2014-09-25 – 2014-09-27 (×4): via INTRAVENOUS
  Filled 2014-09-25 (×8): qty 850

## 2014-09-25 MED ORDER — FIDAXOMICIN 200 MG PO TABS
200.0000 mg | ORAL_TABLET | Freq: Two times a day (BID) | ORAL | Status: DC
  Administered 2014-09-25 – 2014-09-28 (×7): 200 mg via ORAL
  Filled 2014-09-25 (×9): qty 1

## 2014-09-25 MED ORDER — INSULIN ASPART 100 UNIT/ML ~~LOC~~ SOLN
0.0000 [IU] | Freq: Every day | SUBCUTANEOUS | Status: DC
  Administered 2014-09-27: 5 [IU] via SUBCUTANEOUS
  Filled 2014-09-25: qty 5
  Filled 2014-09-25: qty 3

## 2014-09-25 MED ORDER — INSULIN ASPART 100 UNIT/ML ~~LOC~~ SOLN
0.0000 [IU] | Freq: Three times a day (TID) | SUBCUTANEOUS | Status: DC
  Administered 2014-09-25 – 2014-09-26 (×2): 3 [IU] via SUBCUTANEOUS
  Administered 2014-09-26 – 2014-09-27 (×2): 2 [IU] via SUBCUTANEOUS
  Administered 2014-09-27 (×2): 3 [IU] via SUBCUTANEOUS
  Administered 2014-09-28: 2 [IU] via SUBCUTANEOUS
  Administered 2014-09-28: 3 [IU] via SUBCUTANEOUS
  Filled 2014-09-25 (×2): qty 2
  Filled 2014-09-25: qty 3
  Filled 2014-09-25: qty 2
  Filled 2014-09-25 (×3): qty 3
  Filled 2014-09-25: qty 2

## 2014-09-25 NOTE — Progress Notes (Addendum)
[facility] [place] at [facility]   PATIENT NAME: Laurie Ross    MR#:  [id number]  DATE OF BIRTH:  01/02/1941  SUBJECTIVE:  CHIEF COMPLAINT:   Chief Complaint  Patient presents with  . Diarrhea   - Patient comes in with recurrent diarrhea and weakness. Appears to be very dehydrated and noted to be in renal failure with hyperkalemia. - With IV fluids, holding her medications like Lasix and spironolactone, her potassium and kidney function are improving. -Stool for C. difficile is positive again. She is started on Flagyl. Her last history of C. difficile was in March 2016 and she was treated with Flagyl and oral vancomycin at that time.  REVIEW OF SYSTEMS:  Review of Systems  Constitutional: Positive for malaise/fatigue. Negative for fever and chills.  Respiratory: Negative for cough, shortness of breath and wheezing.   Cardiovascular: Negative for chest pain and palpitations.  Gastrointestinal: Positive for diarrhea. Negative for nausea, vomiting, abdominal pain and constipation.  Genitourinary: Negative for dysuria.  Musculoskeletal: Positive for joint pain.  Neurological: Positive for weakness. Negative for dizziness, seizures and headaches.    DRUG ALLERGIES:   Allergies  Allergen Reactions  . Penicillins Itching    VITALS:  Blood pressure 128/74, pulse 73, temperature 98.4 F (36.9 C), temperature source Oral, resp. rate 20, height 5\' 1"  (1.549 m), weight 90.81 kg (200 lb 3.2 oz), SpO2 100 %.  PHYSICAL EXAMINATION:  Physical Exam  GENERAL:  74 y.o.-year-old patient lying in the bed with no acute distress.  EYES: Pupils equal, round, reactive to light and accommodation. No scleral icterus. Extraocular muscles intact. Droopy right upper eyelid-chronic per patient HEENT: Head atraumatic, normocephalic. Oropharynx and nasopharynx clear.  NECK:  Supple, no jugular venous distention. No thyroid enlargement, no tenderness.  LUNGS: Normal breath  sounds bilaterally, no wheezing, rales,rhonchi or crepitation. No use of accessory muscles of respiration. Decreased bibasilar breath sounds CARDIOVASCULAR: S1, S2 normal. No rubs, or gallops. 3/6 systolic murmur is present ABDOMEN: Soft, nontender, nondistended. Bowel sounds present. No organomegaly or mass.  EXTREMITIES: No pedal edema, cyanosis, or clubbing.  NEUROLOGIC: Cranial nerves II through XII are intact. Muscle strength 5/5 in all extremities. Sensation intact. Gait not checked.  PSYCHIATRIC: The patient is alert and oriented x 3. Sleepy as well, but arousable and oriented.  says she lives at home with her daughter. SKIN: No obvious rash, lesion, or ulcer.    LABORATORY PANEL:   CBC  Recent Labs Lab 09/25/14 0504  WBC 8.9  HGB 11.2*  HCT 34.1*  PLT 141*   ------------------------------------------------------------------------------------------------------------------  Chemistries   Recent Labs Lab 09/24/14 1348  09/25/14 0504  NA 134*  < > 139  K 6.8*  < > 5.7*  CL 112*  < > 118*  CO2 15*  < > 18*  GLUCOSE 239*  < > 148*  BUN 66*  < > 61*  CREATININE 2.36*  < > 1.64*  CALCIUM 9.8  < > 9.7  MG 1.9  --   --   AST 15  --   --   ALT 14  --   --   ALKPHOS 55  --   --   BILITOT 0.6  --   --   < > = values in this interval not displayed. ------------------------------------------------------------------------------------------------------------------  Cardiac Enzymes  Recent Labs Lab 09/24/14 1348  TROPONINI <0.03   ------------------------------------------------------------------------------------------------------------------  RADIOLOGY:  No results found.  EKG:   Orders placed or performed during the hospital encounter  of 09/24/14  . ED EKG  . ED EKG  . EKG 12-Lead  . EKG 12-Lead    ASSESSMENT AND PLAN:   74 year old female with past medical history significant for history of Clostridium difficile in March 2016, hypertension diabetes and  congestive heart failure was brought in from home secondary to weakness and diarrhea.  #1 Acute renal failure-likely prerenal, free water losses from chronic diarrhea. Hold Lasix at this time. IV fluids gently for today. Recheck labs later today and tomorrow a.m. Hold nephrotoxins. If does not improve then we'll get a renal ultrasound. -Strictly monitor urine output.  #2 Hyperkalemia -Improving, fluids changed to bicarb  Recheck labs later today Cont to hold aldactone and vasotec  #3 recurrent Clostridium difficile colitis -on flagyl, monitor for diarrhea - GI consult for recurrent c.diff - see if dificid can be used or not  #4 HTN-currently on Norvasc, atenolol- Bloor pressure better controlled.  #5 DM-started on sliding scale insulin.  #6 Chronic diastolic CHF-well compensated  #7 DVT prophylaxis- on Lovenox  Physical therapy consult tomorrow. Possible discharge in 1-2 days.  All the records are reviewed and case discussed with Care Management/Social Workerr. Management plans discussed with the patient, family and they are in agreement.  CODE STATUS: Full code  TOTAL TIME TAKING CARE OF THIS PATIENT: 38 minutes.   POSSIBLE D/C IN 1-2 DAYS, DEPENDING ON CLINICAL CONDITION.   Enid Baas M.D on 09/25/2014 at 12:36 PM  Between 7am to 6pm - Pager - [phone]  After 6pm go to [email or web address] - password [email or web address] [facility]  [facility]  Office  [phone]  CC: Primary care physician; No primary care provider on file.

## 2014-09-25 NOTE — Clinical Social Work Note (Signed)
Clinical Social Work Assessment  Patient Details  Name: Laurie Ross MRN: [id number] Date of Birth: 11/27/1940  Date of referral:  09/25/14               Reason for consult:  Facility Placement                Permission sought to share information with:  Case Manager, Family Supports Permission granted to share information::  Yes, Verbal Permission Granted  Name::        Agency::     Relationship::     Contact Information:     Housing/Transportation Living arrangements for the past 2 months:   (home with daughter Health and safety inspector([name])) Source of Information:  Patient Patient Interpreter Needed:  None Criminal Activity/Legal Involvement Pertinent to Current Situation/Hospitalization:  No - Comment as needed Significant Relationships:  Adult Children Lives with:    Do you feel safe going back to the place where you live?  Yes Need for family participation in patient care:  Yes (Comment)  Care giving concerns:   Patient reports she resides with her daughter, Laurie Ross.  Social Worker assessment / plan:  Patient reports to CSW that she and her daughter, Laurie Ross live together and that Laurie Ross assists her with her meals and medications. Patient claims she has at baseline been able to ambulate with her walker in her home and is able to get up from a seated position without assistance. CSW informed patient that we are awaiting physical therapy to see her and make their recommendations. CSW asked patient if they recommended rehab, [facility]. Patient states [facility]. CSW has prepared an FL2 in the event patient changes her mind. CSW will continue to follow.     Employment status:  Retired Health and safety inspectornsurance information:    PT Recommendations:  Not assessed at this time Information / Referral to community resources:     Patient/Family's Response to care:  Pleasant and cooperative  Patient/Family's  Understanding of and Emotional Response to Diagnosis, Current Treatment, and Prognosis:  Patient was able to say that she realizes she is much weaker than she used to be.  Emotional Assessment Appearance:  Appears stated age Attitude/Demeanor/Rapport:   (pleasant and cooperative) Affect (typically observed):  Pleasant Orientation:  Oriented to Self, Oriented to Place, Oriented to Situation, Oriented to  Time Alcohol / Substance use:  Not Applicable Psych involvement (Current and /or in the community):  No (Comment)  Discharge Needs  Concerns to be addressed:   (mobility) Readmission within the last 30 days:  No Current discharge risk:  None Barriers to Discharge:  No Barriers Identified   York SpanielMonica Macdonald Rigor, LCSW 09/25/2014, 3:35 PM

## 2014-09-25 NOTE — Progress Notes (Signed)
Pt. Has own meds at bedside. Instructed pt not to taker her own meds and we would provide them. Pt agreeable and daughter is to take medications home today.

## 2014-09-25 NOTE — Progress Notes (Signed)
Initial Nutrition Assessment  DOCUMENTATION CODES:     INTERVENTION: Meals and Snacks: Cater to patient preferences Medical Food Supplement Therapy: will recommend on follow if po intake poor.    NUTRITION DIAGNOSIS:  Inadequate oral intake related to altered GI function as evidenced by per patient/family report.  GOAL:  Patient will meet greater than or equal to 90% of their needs  MONITOR:   (Energy Intake, Electrolyte and renal Profile, Digestive System, Anthropometrics)  REASON FOR ASSESSMENT:  Malnutrition Screening Tool    ASSESSMENT:  Pt admitted with diarrhea, secondary to c.diff and acute renal failure with dehydration. PMHx: Past Medical History  Diagnosis Date  . CHF (congestive heart failure)   . Diabetes mellitus without complication   . GERD (gastroesophageal reflux disease)   . Hypertension   . COPD (chronic obstructive pulmonary disease)     occasional home O2 use  . Obstructive sleep apnea   . Hx of Clostridium difficile infection March 2016   PO Intake: pt lunch tray recently delivered. RD assisted pt with setup. Pt reports poor appetite since GI upset. RD notes pt with c.diff a couple of weeks ago as well per MD note. Pt reports still eating meals but sometimes less.  Medications: Ferrous sulfate, flagyl, sodium bicarbonate in sterile water at 4875mL/hr  Labs:  Electrolyte and Renal Profile:  Recent Labs Lab 09/24/14 1348 09/24/14 1505 09/24/14 1942 09/25/14 0504  BUN 66*  --  65* 61*  CREATININE 2.36*  --  2.12* 1.64*  NA 134*  --  137 139  K 6.8* 6.4* 6.1* 5.7*  MG 1.9  --   --   --    Glucose Profile: No results for input(s): GLUCAP in the last 72 hours. Protein Profile:   Recent Labs Lab 09/24/14 1348  ALBUMIN 4.0   Pt reports weight of 268lbs UBW and reports weight of 186lbs now. Pt unclear on timeframe. Current weight 200lbs in CHL.  Height:  Ht Readings from Last 1 Encounters:  09/24/14 5\' 1"  (1.549 m)     Weight:  Wt Readings from Last 1 Encounters:  09/25/14 200 lb 3.2 oz (90.81 kg)    Ideal Body Weight:  47.7 kg  Wt Readings from Last 10 Encounters:  09/25/14 200 lb 3.2 oz (90.81 kg)   Nutrition-Focused physical exam completed. Findings are WDL for fat depletion, muscle depletion, and edema.   BMI:  Body mass index is 37.85 kg/(m^2).  Estimated Nutritional Needs:  Kcal:  1200-1408kcals, BEE: 909kcals, TEE: (IF 1.1-1.3)(AF 1.2) using IBW of 47.7kg  Protein:  48-58g protein (1.0-1.2g/kg) using IBW of 47.7kg  Fluid:  1193-143131mL of fluid (25-9630mL/kg) using IBW of 47.7kg  Skin:  Reviewed, no issues  Diet Order:  Diet Carb Modified Fluid consistency:: Thin; Room service appropriate?: Yes  EDUCATION NEEDS:  Education needs no appropriate at this time   Intake/Output Summary (Last 24 hours) at 09/25/14 1404 Last data filed at 09/25/14 1200  Gross per 24 hour  Intake 1731.88 ml  Output    700 ml  Net 1031.88 ml    Last BM:  6/1  MODERATE Care Level  Leda QuailAllyson Elvyn Krohn, RD, LDN Pager [phone]

## 2014-09-25 NOTE — Consult Note (Signed)
Gastroenterology Consultation  Referring Provider:    Dr Nemiah Commander Primary Care Physician:  Dr Hinda Lenis ([facility]) Primary Gastroenterologist:  Dr. Servando Snare (new)       Reason for Consultation:     Recurrent c diff colitis  Date of Admission:  09/24/2014 Date of Consultation:  09/25/2014        HPI:   Laurie Ross is a 74 y.o. female admitted with recurrent c diff.  Her c PCR is positive, with negative toxin screen. She reports history of C. difficile for the past 2 months. She has been treated with both Flagyl and Cipro she believes.  Over the last week, her diarrhea returned with to numerous to count episodes diarrhea daily. She denies any rectal bleeding or melena. She has not had a colonoscopy. She was started on Flagyl. She has some abdominal cramps that are mild. She was vomiting a couple of times yesterday. She denies any vomiting today. She denies any recent antibiotics except c diff treatment.  3/24 she was discharged on Vanc  QID for 12 days. She tells me she finished her last round of antibiotics last week but cannot remember what she was taking.   Past Medical History  Diagnosis Date  . CHF (congestive heart failure)   . Diabetes mellitus without complication   . GERD (gastroesophageal reflux disease)   . Hypertension   . COPD (chronic obstructive pulmonary disease)     occasional home O2 use  . Obstructive sleep apnea   . Hx of Clostridium difficile infection March 2016    History reviewed. No pertinent past surgical history.  Prior to Admission medications   Medication Sig Start Date End Date Taking? Authorizing Provider  acetaminophen (TYLENOL) 650 MG CR tablet Take 1,300 mg by mouth 2 (two) times daily.   Yes Historical Provider, MD  amLODipine-atorvastatin (CADUET) 5-10 MG per tablet Take 1 tablet by mouth at bedtime.   Yes Historical Provider, MD  aspirin EC 81 MG tablet Take 81 mg by mouth daily.   Yes Historical Provider, MD  atenolol (TENORMIN) 50 MG tablet  Take 50 mg by mouth daily.   Yes Historical Provider, MD  CALCIUM-VITAMIN D PO Take 1 tablet by mouth 2 (two) times daily. osteoporosis   Yes Historical Provider, MD  enalapril (VASOTEC) 20 MG tablet Take 20 mg by mouth 2 (two) times daily.   Yes Historical Provider, MD  ferrous sulfate 325 (65 FE) MG tablet Take 325 mg by mouth at bedtime.   Yes Historical Provider, MD  furosemide (LASIX) 80 MG tablet Take 80 mg by mouth daily.   Yes Historical Provider, MD  levothyroxine (SYNTHROID, LEVOTHROID) 50 MCG tablet Take 50 mcg by mouth daily.   Yes Historical Provider, MD  ranitidine (ZANTAC) 150 MG tablet Take 150 mg by mouth 2 (two) times daily.   Yes Historical Provider, MD  sertraline (ZOLOFT) 100 MG tablet Take 150 mg by mouth daily.   Yes Historical Provider, MD  spironolactone (ALDACTONE) 25 MG tablet Take 50 mg by mouth daily. For fluid   Yes Historical Provider, MD  traZODone (DESYREL) 100 MG tablet Take 100 mg by mouth at bedtime.   Yes Historical Provider, MD    Family History  Problem Relation Age of Onset  . Family history unknown: Yes  There is no known family history of colorectal carcinoma , liver disease, or inflammatory bowel disease.    History  Substance Use Topics  . Smoking status: Never Smoker   . Smokeless  tobacco: Never Used  . Alcohol Use: No    Allergies as of 09/24/2014 - Review Complete 09/24/2014  Allergen Reaction Noted  . Penicillins Itching 09/24/2014    Review of Systems:    All systems reviewed and negative except where noted in HPI.   Physical Exam:  Vital signs in last 24 hours: Temp:  [98.4 F (36.9 C)] 98.4 F (36.9 C) (06/02 0903) Pulse Rate:  [73-98] 73 (06/02 0903) Resp:  [16-20] 20 (06/02 0903) BP: (110-153)/(42-120) 128/74 mmHg (06/02 0930) SpO2:  [95 %-100 %] 100 % (06/02 0903) Weight:  [90.81 kg (200 lb 3.2 oz)] 90.81 kg (200 lb 3.2 oz) (06/02 0523)   Body mass index is 37.85 kg/(m^2). General:   Alert,  Well-developed, obese,  pleasant and cooperative in NAD Head:  Normocephalic and atraumatic. Eyes:  Sclera clear, no icterus.   Conjunctiva pink. Ears:  Normal auditory acuity. Nose:  No deformity, discharge, or lesions. Mouth:  No deformity or lesions,oropharynx pink & moist. Neck:  Supple; no masses or thyromegaly. Lungs:  Respirations even and unlabored.  Clear throughout to auscultation.   No wheezes, crackles, or rhonchi. No acute distress. Heart:  Regular rate and rhythm; no murmurs, clicks, rubs, or gallops. Abdomen:  Normal bowel sounds. Obese.   No bruits.  Soft, non-tender and non-distended without masses, hepatosplenomegaly or hernias noted.  No guarding or rebound tenderness.  Exam limited given body habitus. Rectal:  Deferred. Msk:  Symmetrical without gross deformities.  Good, equal movement & strength bilaterally. Pulses:  Normal pulses noted. Extremities:  No clubbing or edema.  No cyanosis. Neurologic:  Alert and oriented x3;  grossly normal neurologically. Skin:  Pale, Intact without significant lesions or rashes.  No jaundice. Lymph Nodes:  No significant cervical adenopathy. Psych:  Alert and cooperative. Normal mood and affect.  LAB RESULTS:  Recent Labs  09/24/14 1348 09/25/14 0504  WBC 13.6* 8.9  HGB 11.8* 11.2*  HCT 36.2 34.1*  PLT 160 141*   BMET  Recent Labs  09/24/14 1348 09/24/14 1505 09/24/14 1942 09/25/14 0504  NA 134*  --  137 139  K 6.8* 6.4* 6.1* 5.7*  CL 112*  --  114* 118*  CO2 15*  --  16* 18*  GLUCOSE 239*  --  227* 148*  BUN 66*  --  65* 61*  CREATININE 2.36*  --  2.12* 1.64*  CALCIUM 9.8  --  10.2 9.7   LFT  Recent Labs  09/24/14 1348  PROT 7.5  ALBUMIN 4.0  AST 15  ALT 14  ALKPHOS 55  BILITOT 0.6  LIPASE 42     Impression / Plan:   Laurie Ross is a 74 y.o. y/o female with recurrent/refractory C. difficile colitis. She has been treated with Flagyl and vancomycin 3 times over the past 2 months.  She has never had a colonoscopy. Options  include vancomycin taper or Dificid if she is a candidate for coverage.  Plan:  Begin Dificid 200mg  BID for 10 days Handwashing & bleach precautions given Advised to tell all providers history of c diff prior to any antibiotics due to risk of recurrence Avoid dairy except yogurt for next 2 weeks May begin VSL#3 1-2 daily probiotics after treatment, or probiotic of choice DC flagyl Colonoscopy once resolved as outpatient  Thank you for involving me in the care of this patient.     LOS: 1 day  Lorenza BurtonKandice Embree Brawley, NP  09/25/2014, 2:04 PM [facility]  [address]  [place], [place] [address] Phone: [phone] Fax : [phone]

## 2014-09-26 LAB — BASIC METABOLIC PANEL
ANION GAP: 9 (ref 5–15)
BUN: 50 mg/dL — ABNORMAL HIGH (ref 6–20)
CHLORIDE: 110 mmol/L (ref 101–111)
CO2: 19 mmol/L — ABNORMAL LOW (ref 22–32)
Calcium: 9.2 mg/dL (ref 8.9–10.3)
Creatinine, Ser: 1.46 mg/dL — ABNORMAL HIGH (ref 0.44–1.00)
GFR calc Af Amer: 39 mL/min — ABNORMAL LOW (ref >60)
GFR calc non Af Amer: 34 mL/min — ABNORMAL LOW (ref >60)
Glucose, Bld: 105 mg/dL — ABNORMAL HIGH (ref 65–99)
Potassium: 4.6 mmol/L (ref 3.5–5.1)
SODIUM: 138 mmol/L (ref 135–145)

## 2014-09-26 LAB — GLUCOSE, CAPILLARY
GLUCOSE-CAPILLARY: 149 mg/dL — AB (ref 65–99)
Glucose-Capillary: 111 mg/dL — ABNORMAL HIGH (ref 65–99)
Glucose-Capillary: 189 mg/dL — ABNORMAL HIGH (ref 65–99)
Glucose-Capillary: 213 mg/dL — ABNORMAL HIGH (ref 65–99)

## 2014-09-26 LAB — CBC
HCT: 32.9 % — ABNORMAL LOW (ref 35.0–47.0)
Hemoglobin: 11.1 g/dL — ABNORMAL LOW (ref 12.0–16.0)
MCH: 32 pg (ref 26.0–34.0)
MCHC: 33.6 g/dL (ref 32.0–36.0)
MCV: 95.2 fL (ref 80.0–100.0)
Platelets: 126 10*3/uL — ABNORMAL LOW (ref 150–440)
RBC: 3.46 MIL/uL — AB (ref 3.80–5.20)
RDW: 15.4 % — AB (ref 11.5–14.5)
WBC: 6.4 10*3/uL (ref 3.6–11.0)

## 2014-09-26 NOTE — Care Management (Signed)
[facility]. Anticipating discharge tomorrow if labs OK.  [facility]

## 2014-09-26 NOTE — Progress Notes (Signed)
Nurse spoke to Hospitalist Dr Clint GuyHower during the night concerning pt's hypotension. MD reported to nurse to continue to monitor. No new orders.

## 2014-09-26 NOTE — Progress Notes (Signed)
[facility] - [place] at [facility]   PATIENT NAME: Laurie Ross    MR#:  [id number]  DATE OF BIRTH:  11-04-1940  SUBJECTIVE:  CHIEF COMPLAINT:   Chief Complaint  Patient presents with  . Diarrhea   - Patient comes in with recurrent diarrhea and weakness. Appears to be very dehydrated and noted to be in renal failure with hyperkalemia. - With IV fluids, holding her medications like Lasix and spironolactone, her potassium and kidney function are improving. -Stool for C. difficile is positive again. She is started on Flagyl. Her last history of C. difficile was in March 2016 and she was treated with Flagyl and oral vancomycin at that time.  REVIEW OF SYSTEMS:  ROS  DRUG ALLERGIES:   Allergies  Allergen Reactions  . Penicillins Itching    VITALS:  Blood pressure 138/48, pulse 80, temperature 98.3 F (36.8 C), temperature source Oral, resp. rate 17, height  (1.549 m), weight 89.041 kg (196 lb 4.8 oz), SpO2 96 %.  PHYSICAL EXAMINATION:  Physical Exam  GENERAL:  74 y.o.-year-old patient lying in the bed with no acute distress.  EYES: Pupils equal, round, reactive to light and accommodation. No scleral icterus. Extraocular muscles intact. Droopy right upper eyelid-chronic per patient HEENT: Head atraumatic, normocephalic. Oropharynx and nasopharynx clear.  NECK:  Supple, no jugular venous distention. No thyroid enlargement, no tenderness.  LUNGS: Normal breath sounds bilaterally, no wheezing, rales,rhonchi or crepitation. No use of accessory muscles of respiration. Decreased bibasilar breath sounds CARDIOVASCULAR: S1, S2 normal. No rubs, or gallops. 3/6 systolic murmur is present ABDOMEN: Soft, nontender, nondistended. Bowel sounds present. No organomegaly or mass.  EXTREMITIES: No pedal edema, cyanosis, or clubbing.  NEUROLOGIC: Cranial nerves II through XII are intact. Muscle strength 5/5 in all extremities. Sensation intact. Gait not checked.   PSYCHIATRIC: The patient is alert and oriented x 3. Sleepy as well, but arousable and oriented.  says she lives at home with her daughter. SKIN: No obvious rash, lesion, or ulcer.    LABORATORY PANEL:   CBC  Recent Labs Lab 09/26/14 0641  WBC 6.4  HGB 11.1*  HCT 32.9*  PLT 126*   ------------------------------------------------------------------------------------------------------------------  Chemistries   Recent Labs Lab 09/24/14 1348  09/26/14 0554  NA 134*  < > 138  K 6.8*  < > 4.6  CL 112*  < > 110  CO2 15*  < > 19*  GLUCOSE 239*  < > 105*  BUN 66*  < > 50*  CREATININE 2.36*  < > 1.46*  CALCIUM 9.8  < > 9.2  MG 1.9  --   --   AST 15  --   --   ALT 14  --   --   ALKPHOS 55  --   --   BILITOT 0.6  --   --   < > = values in this interval not displayed. ------------------------------------------------------------------------------------------------------------------  Cardiac Enzymes  Recent Labs Lab 09/24/14 1348  TROPONINI <0.03   ------------------------------------------------------------------------------------------------------------------  RADIOLOGY:  No results found.  EKG:   Orders placed or performed during the hospital encounter of 09/24/14  . ED EKG  . ED EKG  . EKG 12-Lead  . EKG 12-Lead    ASSESSMENT AND PLAN:   74 year old female with past medical history significant for history of Clostridium difficile in March 2016, hypertension diabetes and congestive heart failure was brought in from home secondary to weakness and diarrhea.  #1 Acute renal failure-likely prerenal, free water losses from chronic  diarrhea. Hold Lasix at this time. Received fluids yesterday. BMP from today pending. - If improving cr, will discontinue fluids. Hold nephrotoxins.  -If does not improve then we'll get a renal ultrasound. -Strictly monitor urine output.  #2 Hyperkalemia -Improving, on bicarb drip Recheck labs pending from today Cont to hold  aldactone and vasotec  #3 recurrent Clostridium difficile colitis -appreciate GI consult - still having loose stools- but improving - started on dificid 09/25/14- will need 10 day treatment  #4 HTN-currently on Norvasc, atenolol- Blood pressure better controlled.  #5 DM-started on sliding scale insulin.  #6 Chronic diastolic CHF-lasix on hold, well compensated, check labs today and discontinue fluids if improving  #7 DVT prophylaxis- on Lovenox  Physical therapy consult today. Possible discharge tomorrow if stable.   All the records are reviewed and case discussed with Care Management/Social Workerr. Management plans discussed with the patient, family and they are in agreement.  CODE STATUS: Full code  TOTAL TIME TAKING CARE OF THIS PATIENT: 38 minutes.   POSSIBLE D/C IN 1-2 DAYS, DEPENDING ON CLINICAL CONDITION.   Amine Adelson M.D on 09/26/2014 at 12:57 PM  Between 7am to 6pm - Pager - [phone]  After 6pm go to [email or web address] - password [email or web address] [facility]  [facility]  Office  [phone]  CC: Primary care physician; No primary care provider on file.

## 2014-09-26 NOTE — Progress Notes (Signed)
Clinical Child psychotherapistocial Worker (CSW) contacted [facility] and confirmed that patient is in their program. CSW made [facility] aware that per CSW's note yesterday patient would like to return home. CSW will continue to follow and assist as needed.   Jetta LoutBailey Morgan, LCSWA [phone]

## 2014-09-27 LAB — CBC
HCT: 30.7 % — ABNORMAL LOW (ref 35.0–47.0)
Hemoglobin: 10.5 g/dL — ABNORMAL LOW (ref 12.0–16.0)
MCH: 32.2 pg (ref 26.0–34.0)
MCHC: 34.2 g/dL (ref 32.0–36.0)
MCV: 94.2 fL (ref 80.0–100.0)
Platelets: 115 10*3/uL — ABNORMAL LOW (ref 150–440)
RBC: 3.26 MIL/uL — ABNORMAL LOW (ref 3.80–5.20)
RDW: 14.6 % — ABNORMAL HIGH (ref 11.5–14.5)
WBC: 6 10*3/uL (ref 3.6–11.0)

## 2014-09-27 LAB — BASIC METABOLIC PANEL
ANION GAP: 8 (ref 5–15)
BUN: 40 mg/dL — ABNORMAL HIGH (ref 6–20)
CO2: 25 mmol/L (ref 22–32)
Calcium: 8.5 mg/dL — ABNORMAL LOW (ref 8.9–10.3)
Chloride: 107 mmol/L (ref 101–111)
Creatinine, Ser: 1.18 mg/dL — ABNORMAL HIGH (ref 0.44–1.00)
GFR calc Af Amer: 51 mL/min — ABNORMAL LOW (ref >60)
GFR calc non Af Amer: 44 mL/min — ABNORMAL LOW (ref >60)
Glucose, Bld: 150 mg/dL — ABNORMAL HIGH (ref 65–99)
POTASSIUM: 4.1 mmol/L (ref 3.5–5.1)
Sodium: 140 mmol/L (ref 135–145)

## 2014-09-27 LAB — GLUCOSE, CAPILLARY
GLUCOSE-CAPILLARY: 217 mg/dL — AB (ref 65–99)
Glucose-Capillary: 158 mg/dL — ABNORMAL HIGH (ref 65–99)
Glucose-Capillary: 203 mg/dL — ABNORMAL HIGH (ref 65–99)
Glucose-Capillary: 180 mg/dL — ABNORMAL HIGH (ref 65–99)

## 2014-09-27 NOTE — Progress Notes (Signed)
Patient A/O, no noted distress. Denies pain. Administered insulin coverage, see EMar. [facility]. Staff will continue to monitor and meet needs.

## 2014-09-27 NOTE — Evaluation (Signed)
Physical Therapy Evaluation Patient Details Name: Laurie LeisureDoris L Ross MRN: [id number] DOB: 02/25/1941 Today's Date: 09/27/2014   History of Present Illness  Pt is a 74yo white female who [facility] with [facility]. Hyperkalemia of 5.4 noted at time of eval. Pt is alert and oriented and motivated to participate in PT eval.   Clinical Impression  Pt tolerating treatment session well with mild c/o dizziness, motivated and able to complete most of PT sesssion as planned. In-depth assessment of AMB and gait held at this time due to orthostatic vitals. Patient presenting with impairment balance and activity tolerance, limiting ability to perform ADL and mobility tasks at  baseline level of function. Pt has adequate social support and equipment for suitable DC to home with HHPT followup. Patient will benefit from skilled intervention to address the above impairments and limitations, in order to restore to prior level of function, improve patient safety upon discharge, and to decrease caregiver burden.      Follow Up Recommendations Home health PT    Equipment Recommendations  None recommended by PT    Recommendations for Other Services       Precautions / Restrictions Precautions Precaution Comments: Enteric ISO Restrictions Weight Bearing Restrictions: No      Mobility  Bed Mobility Overal bed mobility: Needs Assistance Bed Mobility: Supine to Sit     Supine to sit: Supervision     General bed mobility comments: Immediate onset of dizziness with movement, slow to resolve. Later noted to be orthostatic.   Transfers Overall transfer level: Needs assistance Equipment used: Rolling walker (2 wheeled) Transfers: Sit to/from Stand Sit to Stand: Min assist;From elevated surface            Ambulation/Gait Ambulation/Gait assistance: Min guard Ambulation Distance (Feet): 3 Feet (bed to chair, L side stepping) Assistive device: Rolling walker (2 wheeled) Gait  Pattern/deviations:  (will defer until later visit. )     General Gait Details: PT limited further ambulation due to orthostatic dizziness + c/o dizziness.   Stairs            Wheelchair Mobility    Modified Rankin (Stroke Patients Only)       Balance Overall balance assessment: Needs assistance Sitting-balance support: No upper extremity supported;Feet unsupported Sitting balance-Leahy Scale: Good     Standing balance support: Bilateral upper extremity supported Standing balance-Leahy Scale: Fair                               Pertinent Vitals/Pain Pain Assessment: No/denies pain    Home Living Family/patient expects to be discharged to:: Private residence Living Arrangements: Children;Other relatives Available Help at Discharge: Family Type of Home: Mobile home Home Access: Ramped entrance     Home Layout: One level Home Equipment: Bedside commode (rollator walker )      Prior Function           Comments: Dependent in cooking, groceries, and transportation within community; indep in bathing, dressing, tolletting, and limited community distance ambulation.      Hand Dominance        Extremity/Trunk Assessment   Upper Extremity Assessment: Overall WFL for tasks assessed           Lower Extremity Assessment: Overall WFL for tasks assessed (MMT screening recveals 5/5 strength in BLE and strong grip. )      Cervical / Trunk Assessment: Normal  Communication   Communication: No difficulties  Cognition  Arousal/Alertness: Awake/alert ("tired") Behavior During Therapy: WFL for tasks assessed/performed Overall Cognitive Status: Within Functional Limits for tasks assessed                      General Comments      Exercises        Assessment/Plan    PT Assessment Patient needs continued PT services  PT Diagnosis Difficulty walking;Abnormality of gait   PT Problem List Decreased knowledge of use of DME;Decreased  activity tolerance;Decreased balance  PT Treatment Interventions DME instruction;Gait training;Functional mobility training;Therapeutic activities;Therapeutic exercise   PT Goals (Current goals can be found in the Care Plan section) Acute Rehab PT Goals Patient Stated Goal: Pt expresses would like to go home.  PT Goal Formulation: With patient Time For Goal Achievement: 10/11/14 Potential to Achieve Goals: Good    Frequency Min 2X/week   Barriers to discharge        Co-evaluation               End of Session Equipment Utilized During Treatment: Gait belt Activity Tolerance: Other (comment) (Limited by vitals/dizziness) Patient left: in chair;with chair alarm set;with call bell/phone within reach Nurse Communication: Mobility status;Other (comment) (vitals, need to get to chair 1x/day)         Time: 1135-1200 PT Time Calculation (min) (ACUTE ONLY): 25 min   Charges:   PT Evaluation $Initial PT Evaluation Tier I: 1 Procedure     PT G Codes:       12:55 PM  Rosamaria Lints, PT, DPT [place] License # [id number]      Buccola,Allan C 09/27/2014, 12:53 PM

## 2014-09-27 NOTE — Progress Notes (Signed)
[facility] - [place] at [facility]   PATIENT NAME: Laurie MaeDoris Conway    MR#:  [id number]  DATE OF BIRTH:  06/05/1940  SUBJECTIVE:  CHIEF COMPLAINT:   Chief Complaint  Patient presents with  . Diarrhea   - Patient comes in with recurrent diarrhea and weakness. Appears to be very dehydrated and noted to be in renal failure with hyperkalemia at arrival - With IV fluids, holding her medications like Lasix and spironolactone, her potassium and kidney function are improving. -Stool for C. difficile is positive again. She was started on Flagyl. Her last history of C. difficile was in March 2016 and she was treated with Flagyl and oral vancomycin at that time. Gastroenterology has recommended Dificid Today reporting formed stool with minimal abdominal pain in the lower abdomen. Reporting weakness  REVIEW OF SYSTEMS:  Review of Systems  Constitutional: Negative for fever and weight loss.  HENT: Negative for tinnitus.   Eyes: Negative for blurred vision and photophobia.  Respiratory: Negative for cough and sputum production.   Cardiovascular: Negative for chest pain and palpitations.  Gastrointestinal: Negative for heartburn, nausea, vomiting, abdominal pain and diarrhea.  Genitourinary: Negative for dysuria and hematuria.  Musculoskeletal: Negative for back pain and joint pain.  Skin: Negative for itching and rash.  Neurological: Negative for dizziness, tingling, tremors and headaches.    DRUG ALLERGIES:   Allergies  Allergen Reactions  . Penicillins Itching    VITALS:  Blood pressure 108/58, pulse 74, temperature 98.4 F (36.9 C), temperature source Oral, resp. rate 17, height 5\' 1"  (1.549 m), weight 89.268 kg (196 lb 12.8 oz), SpO2 96 %.  PHYSICAL EXAMINATION:  Physical Exam  GENERAL:  74 y.o.-year-old patient lying in the bed with no acute distress.  EYES: Pupils equal, round, reactive to light and accommodation. No scleral icterus. Extraocular muscles  intact. Droopy right upper eyelid-chronic per patient HEENT: Head atraumatic, normocephalic. Oropharynx and nasopharynx clear.  NECK:  Supple, no jugular venous distention. No thyroid enlargement, no tenderness.  LUNGS: Normal breath sounds bilaterally, no wheezing, rales,rhonchi or crepitation. No use of accessory muscles of respiration. Decreased bibasilar breath sounds CARDIOVASCULAR: S1, S2 normal. No rubs, or gallops. 3/6 systolic murmur is present ABDOMEN: Soft, nontender, nondistended. Bowel sounds present. No organomegaly or mass.  EXTREMITIES: No pedal edema, cyanosis, or clubbing.  NEUROLOGIC: Cranial nerves II through XII are intact. Muscle strength 5/5 in all extremities. Sensation intact. Gait not checked.  PSYCHIATRIC: The patient is alert and oriented x 3. Sleepy as well, but arousable and oriented.  says she lives at home with her daughter. SKIN: No obvious rash, lesion, or ulcer.    LABORATORY PANEL:   CBC  Recent Labs Lab 09/27/14 0745  WBC 6.0  HGB 10.5*  HCT 30.7*  PLT 115*   ------------------------------------------------------------------------------------------------------------------  Chemistries   Recent Labs Lab 09/24/14 1348  09/27/14 0745  NA 134*  < > 140  K 6.8*  < > 4.1  CL 112*  < > 107  CO2 15*  < > 25  GLUCOSE 239*  < > 150*  BUN 66*  < > 40*  CREATININE 2.36*  < > 1.18*  CALCIUM 9.8  < > 8.5*  MG 1.9  --   --   AST 15  --   --   ALT 14  --   --   ALKPHOS 55  --   --   BILITOT 0.6  --   --   < > = values in  this interval not displayed. ------------------------------------------------------------------------------------------------------------------  Cardiac Enzymes  Recent Labs Lab 09/24/14 1348  TROPONINI <0.03   ------------------------------------------------------------------------------------------------------------------  RADIOLOGY:  No results found.  EKG:   Orders placed or performed during the hospital  encounter of 09/24/14  . ED EKG  . ED EKG  . EKG 12-Lead  . EKG 12-Lead    ASSESSMENT AND PLAN:   74 year old female with past medical history significant for history of Clostridium difficile in March 2016, hypertension diabetes and congestive heart failure was brought in from home secondary to weakness and diarrhea.  #1 Acute renal failure-likely prerenal, free water losses from chronic diarrhea. Hold Lasix at this time. Received fluids . BMP from today with creatinine at 1.18 , clinically improving -  improving cr, w discontinue fluids. Hold nephrotoxins.  -If does not improve then we'll get a renal ultrasound. -Strictly monitor urine output.  #2 Hyperkalemia -Improving, on bicarb drip Cont to hold aldactone and vasotec  #3 recurrent Clostridium difficile colitis -appreciate GI consult -  loose stools- improving - started on dificid 09/25/14- will need 10 day treatment -Probiotics, cultured yogurt is recommended but not other diary products  #4 HTN-currently on Norvasc, atenolol- Blood pressure better controlled.  #5 DM-started on sliding scale insulin.  #6 Chronic diastolic CHF-lasix on hold, well compensated, check labs today and discontinue fluids if improving  #7 DVT prophylaxis- on Lovenox  Physical therapy recommends home with home PT Possible discharge tomorrow if stable.   All the records are reviewed and case discussed with Care Management/Social Workerr. Management plans discussed with the patient, family and they are in agreement.  CODE STATUS: Full code  TOTAL TIME TAKING CARE OF THIS PATIENT: .   POSSIBLE D/C IN 1-2 DAYS, DEPENDING ON CLINICAL CONDITION.   Ramonita Lab M.D on 09/27/2014 at 2:46 PM  Between 7am to 6pm - Pager - [phone]  After 6pm go to [email or web address] - password [email or web address] [facility]  [place] [facility]  Office  [phone]  CC: Primary care physician; No primary care provider on file.

## 2014-09-28 LAB — C DIFFICILE QUICK SCREEN W PCR REFLEX
C DIFFICILE (CDIFF) TOXIN: NEGATIVE
C DIFFICLE (CDIFF) ANTIGEN: POSITIVE

## 2014-09-28 LAB — CBC
HCT: 30.3 % — ABNORMAL LOW (ref 35.0–47.0)
Hemoglobin: 10.2 g/dL — ABNORMAL LOW (ref 12.0–16.0)
MCH: 31.7 pg (ref 26.0–34.0)
MCHC: 33.8 g/dL (ref 32.0–36.0)
MCV: 93.6 fL (ref 80.0–100.0)
PLATELETS: 109 10*3/uL — AB (ref 150–440)
RBC: 3.23 MIL/uL — ABNORMAL LOW (ref 3.80–5.20)
RDW: 14.4 % (ref 11.5–14.5)
WBC: 5.9 10*3/uL (ref 3.6–11.0)

## 2014-09-28 LAB — GLUCOSE, CAPILLARY
Glucose-Capillary: 155 mg/dL — ABNORMAL HIGH (ref 65–99)
Glucose-Capillary: 210 mg/dL — ABNORMAL HIGH (ref 65–99)

## 2014-09-28 LAB — CLOSTRIDIUM DIFFICILE BY PCR: Toxigenic C. Difficile by PCR: POSITIVE — AB

## 2014-09-28 MED ORDER — HYDROCODONE-ACETAMINOPHEN 5-325 MG PO TABS: 1.0000 | ORAL_TABLET | Freq: Four times a day (QID) | ORAL | Status: DC | PRN

## 2014-09-28 MED ORDER — ACETAMINOPHEN 325 MG PO TABS: 650.0000 mg | ORAL_TABLET | Freq: Four times a day (QID) | ORAL | Status: DC | PRN

## 2014-09-28 MED ORDER — FIDAXOMICIN 200 MG PO TABS: 200.0000 mg | ORAL_TABLET | Freq: Two times a day (BID) | ORAL | Status: AC

## 2014-09-28 NOTE — Care Management Note (Signed)
Case Management Note  Patient Details  Name: Laurie Ross MRN: [id number] Date of Birth: 10/07/1940  Subjective/Objective:      [facility] to [facility] to continue[facility]resume  [facility] orders  and to assist with Dificid 200mg  Po BID starting today. Per Dr Kristin BruinsGouru   [facility] will provide Dificid starting today 09/28/14.              Expected Discharge Date:                  Expected Discharge Plan:     In-House Referral:     Discharge planning Services     Post Acute Care Choice:    Choice offered to:     DME Arranged:    DME Agency:     HH Arranged:    HH Agency:     Status of Service:     Medicare Important Message Given:  Yes Date Medicare IM Given:  09/26/14 Medicare IM give by:  Serena Colonelick Berkhead NCM Date Additional Medicare IM Given:    Additional Medicare Important Message give by:     If discussed at Long Length of Stay Meetings, dates discussed:    Additional Comments:  Lonnie Reth A, RN 09/28/2014, 12:14 PM

## 2014-09-28 NOTE — Progress Notes (Signed)
Bedpan had stool in it

## 2014-09-28 NOTE — Discharge Summary (Signed)
[facility] - [place] at [facility]   PATIENT NAME: Laurie Ross    MR#:  [id number]  DATE OF BIRTH:  10/03/1940  DATE OF ADMISSION:  09/24/2014 ADMITTING PHYSICIAN: Milagros Loll, MD  DATE OF DISCHARGE: No discharge date for patient encounter.  PRIMARY CARE PHYSICIAN: No primary care provider on file.    ADMISSION DIAGNOSIS:  Diarrhea [R19.7] Acute hyperkalemia [E87.5] Acute on chronic renal failure [N17.9, N18.9] Non-intractable vomiting with nausea, vomiting of unspecified type [R11.2]  DISCHARGE DIAGNOSIS:  Active Problems:   Diarrhea  C. difficile colitis-recurrent Acute renal insufficiency Hyperkalemia  SECONDARY DIAGNOSIS:   Past Medical History  Diagnosis Date  . CHF (congestive heart failure)   . Diabetes mellitus without complication   . GERD (gastroesophageal reflux disease)   . Hypertension   . COPD (chronic obstructive pulmonary disease)     occasional home O2 use  . Obstructive sleep apnea   . Hx of Clostridium difficile infection March 2016    HOSPITAL COURSE:  Brief history and physical Laurie Ross is a 74 y.o. female with a known history of DM,HTN, h/o C diff here with on going diarrhea. Found to have ARF and Hyperkalemia in ED. Has had on and off diarrhea for past 2 months, treated multiple times with PO abx. Patient was diarrhea free for 2 weeks and then started having diarrhea 5 days back. Had episodes of vomiting yesterday and today. No abdominal pain. No blood in vomiting or stool. No other antibiotics used except Flagyl and vancomycin as prescribed by her physician. Patient's primary care physician Dr. Victory Dakin was available in patient's room and provided history in addition to history from patient. Please review history and physical for complete details  Hospital course   #1 Acute renal failure-likely prerenal, free water losses from chronic diarrhea. Received fluids . BMP  with creatinine at 1.18 , clinically  improving  #2 Hyperkalemia -Improved ,was on bicarb drip  held aldactone and vasotec during hospital course. No potassium being back to normal we are resuming all Dr. and mass effect. Primary care physician to repeat BMP and if necessary can titrate the doses of all lactone ROS attack  #3 recurrent Clostridium difficile colitis -appreciate GI consult - loose stools- improving - started on dificid 09/25/14- will need 10 day treatment -Probiotics, cultured yogurt is recommended but not other diary products Outpatient follow-up with gastroenterology in a week after completing the antibiotic course.  #4 HTN-currently on Norvasc, atenolol- Blood pressure better controlled.  #5 DM- on sliding scale insulin during hospital course  #6 Chronic diastolic CHF-lasix on hold during hospital course in view of renal insufficiency. As the renal function is significantly improved and almost back to baseline and we are resuming Lasix and Aldactone  #7 DVT prophylaxis- provided with Lovenox  Physical therapy recommends home with home PT  The diagnosis and plan of care was discussed in detail with the patient and her [facility] physician Dr. Krystal Clark.     DISCHARGE CONDITIONS:   Satisfactory  CONSULTS OBTAINED:  Treatment Team:  Midge Minium, MD   PROCEDURES none  DRUG ALLERGIES:   Allergies  Allergen Reactions  . Penicillins Itching    DISCHARGE MEDICATIONS:   Current Discharge Medication List    START taking these medications   Details  acetaminophen (TYLENOL) 325 MG tablet Take 2 tablets (650 mg total) by mouth every 6 (six) hours as needed for mild pain (or Fever >/= 101).    fidaxomicin (DIFICID) 200 MG TABS tablet  Take 1 tablet (200 mg total) by mouth 2 (two) times daily. Qty: 15 tablet, Refills: 0    HYDROcodone-acetaminophen (NORCO/VICODIN) 5-325 MG per tablet Take 1-2 tablets by mouth every 6 (six) hours as needed for moderate pain or severe pain. Qty: 15 tablet, Refills: 0       CONTINUE these medications which have NOT CHANGED   Details  amLODipine-atorvastatin (CADUET) 5-10 MG per tablet Take 1 tablet by mouth at bedtime.    aspirin EC 81 MG tablet Take 81 mg by mouth daily.    atenolol (TENORMIN) 50 MG tablet Take 50 mg by mouth daily.    CALCIUM-VITAMIN D PO Take 1 tablet by mouth 2 (two) times daily. osteoporosis    enalapril (VASOTEC) 20 MG tablet Take 20 mg by mouth 2 (two) times daily.    ferrous sulfate 325 (65 FE) MG tablet Take 325 mg by mouth at bedtime.    furosemide (LASIX) 80 MG tablet Take 80 mg by mouth daily.    levothyroxine (SYNTHROID, LEVOTHROID) 50 MCG tablet Take 50 mcg by mouth daily.    ranitidine (ZANTAC) 150 MG tablet Take 150 mg by mouth 2 (two) times daily.    sertraline (ZOLOFT) 100 MG tablet Take 150 mg by mouth daily.    spironolactone (ALDACTONE) 25 MG tablet Take 50 mg by mouth daily. For fluid    traZODone (DESYREL) 100 MG tablet Take 100 mg by mouth at bedtime.      STOP taking these medications     acetaminophen (TYLENOL) 650 MG CR tablet          DISCHARGE INSTRUCTIONS:   Continue hand hygiene Follow-up with primary care physician at [facility] in 1-2 days, PCP to consider repeating BMP to monitor her renal function and lites in 1-2 days Follow-up with gastroenterology in a week Home health with home physical therapy DIET:  Healthy heart as tolerated  DISCHARGE CONDITION:  Satisfactory  ACTIVITY:  As tolerated, as recommended by home PT  OXYGEN:  Home Oxygen: no  Oxygen Delivery: no  DISCHARGE LOCATION:  Home to do daughter's care continue home PT  If you experience worsening of your admission symptoms, develop shortness of breath, life threatening emergency, suicidal or homicidal thoughts you must seek medical attention immediately by calling 911 or calling your MD immediately  if symptoms less severe.  You Must read complete instructions/literature along with all the possible adverse  reactions/side effects for all the Medicines you take and that have been prescribed to you. Take any new Medicines after you have completely understood and accpet all the possible adverse reactions/side effects.   Please note  You were cared for by a hospitalist during your hospital stay. If you have any questions about your discharge medications or the care you received while you were in the hospital after you are discharged, you can call the unit and asked to speak with the hospitalist on call if the hospitalist that took care of you is not available. Once you are discharged, your primary care physician will handle any further medical issues. Please note that NO REFILLS for any discharge medications will be authorized once you are discharged, as it is imperative that you return to your primary care physician (or establish a relationship with a primary care physician if you do not have one) for your aftercare needs so that they can reassess your need for medications and monitor your lab values.     Today  Chief Complaint  Patient presents with  .  Diarrhea   Reporting formed stool. Denies any abdominal pain. Tolerating by mouth. No new complaints.  ROS: CONSTITUTIONAL: Denies fevers, chills. Denies any fatigue, weakness.  EYES: Denies blurry vision, double vision, eye pain. EARS, NOSE, THROAT: Denies tinnitus, ear pain, hearing loss. RESPIRATORY: Denies cough, wheeze, shortness of breath.  CARDIOVASCULAR: Denies chest pain, palpitations, edema.  GASTROINTESTINAL: Denies nausea, vomiting, diarrhea, abdominal pain. Denies bright red blood per rectum. GENITOURINARY: Denies dysuria, hematuria. ENDOCRINE: Denies nocturia or thyroid problems. HEMATOLOGIC AND LYMPHATIC: Denies easy bruising or bleeding. SKIN: Denies rash or lesion. MUSCULOSKELETAL: Denies pain in neck, back, shoulder, knees, hips or arthritic symptoms.  NEUROLOGIC: Denies paralysis, paresthesias.  PSYCHIATRIC: Denies anxiety or  depressive symptoms.   VITAL SIGNS:  Blood pressure 125/46, pulse 86, temperature 98.2 F (36.8 C), temperature source Oral, resp. rate 17, height  (1.549 m), weight 92.67 kg (204 lb 4.8 oz), SpO2 93 %.  I/O:   Intake/Output Summary (Last 24 hours) at 09/28/14 1142 Last data filed at 09/28/14 0839  Gross per 24 hour  Intake 2090.9 ml  Output      2 ml  Net 2088.9 ml    PHYSICAL EXAMINATION:  GENERAL:  74 y.o.-year-old patient lying in the bed with no acute distress.  EYES: Pupils equal, round, reactive to light and accommodation. No scleral icterus. Extraocular muscles intact.  HEENT: Head atraumatic, normocephalic. Oropharynx and nasopharynx clear.  NECK:  Supple, no jugular venous distention. No thyroid enlargement, no tenderness.  LUNGS: Normal breath sounds bilaterally, no wheezing, rales,rhonchi or crepitation. No use of accessory muscles of respiration.  CARDIOVASCULAR: S1, S2 normal. No murmurs, rubs, or gallops.  ABDOMEN: Soft, non-tender, non-distended. Bowel sounds present. No organomegaly or mass.  EXTREMITIES: No pedal edema, cyanosis, or clubbing.  NEUROLOGIC: Cranial nerves II through XII are intact. Muscle strength 5/5 in all extremities. Sensation intact. Gait not checked.  PSYCHIATRIC: The patient is alert and oriented x 3.  SKIN: No obvious rash, lesion, or ulcer.   DATA REVIEW:   CBC  Recent Labs Lab 09/28/14 0701  WBC 5.9  HGB 10.2*  HCT 30.3*  PLT 109*    Chemistries   Recent Labs Lab 09/24/14 1348  09/27/14 0745  NA 134*  < > 140  K 6.8*  < > 4.1  CL 112*  < > 107  CO2 15*  < > 25  GLUCOSE 239*  < > 150*  BUN 66*  < > 40*  CREATININE 2.36*  < > 1.18*  CALCIUM 9.8  < > 8.5*  MG 1.9  --   --   AST 15  --   --   ALT 14  --   --   ALKPHOS 55  --   --   BILITOT 0.6  --   --   < > = values in this interval not displayed.  Cardiac Enzymes  Recent Labs Lab 09/24/14 1348  TROPONINI <0.03    Microbiology Results  Results for  orders placed or performed during the hospital encounter of 09/24/14  C difficile quick scan w PCR reflex [facility])     Status: None   Collection Time: 09/25/14 12:46 AM  Result Value Ref Range Status   C Diff antigen POSITIVE  Final   C Diff toxin NEGATIVE  Final   C Diff interpretation   Final    Positive for toxigenic C. difficile, active toxin production not detected. Patient has toxigenic C. difficile organisms present in the bowel, but toxin was not detected.  The patient may be a carrier or the level of toxin in the sample was below the limit  of detection. This information should be used in conjunction with the patient's clinical history when deciding on possible therapy.     Comment: CRITICAL RESULT CALLED TO, READ BACK BY AND VERIFIED WITH: JOSH SIMSER @ 0335 6.2.16 MPG   Clostridium Difficile by PCR (not at [facility])     Status: None   Collection Time: 09/25/14 12:46 AM  Result Value Ref Range Status   C difficile by pcr Toxigenic C.diff POSITIVE NEGATIVE Final  C difficile quick scan w PCR reflex [facility])     Status: None   Collection Time: 09/27/14  9:43 PM  Result Value Ref Range Status   C Diff antigen POSITIVE  Final   C Diff toxin NEGATIVE  Final   C Diff interpretation   Final    Positive for toxigenic C. difficile, active toxin production not detected. Patient has toxigenic C. difficile organisms present in the bowel, but toxin was not detected. The patient may be a carrier or the level of toxin in the sample was below the limit  of detection. This information should be used in conjunction with the patient's clinical history when deciding on possible therapy.   Clostridium Difficile by PCR (not at [facility])     Status: Abnormal   Collection Time: 09/27/14  9:43 PM  Result Value Ref Range Status   C difficile by pcr POSITIVE (A) NEGATIVE Final    Comment: CRITICAL RESULT CALLED TO, READ BACK BY AND VERIFIED WITH: LEIGH MILES AT 6578 ON 09/28/14 BY [facility]     RADIOLOGY:  No results  found.  EKG:   Orders placed or performed during the hospital encounter of 09/24/14  . ED EKG  . ED EKG  . EKG 12-Lead  . EKG 12-Lead      Management plans discussed with the patient, family and they are in agreement.  CODE STATUS:     Code Status Orders        Start     Ordered   09/24/14 1720  Full code   Continuous     09/24/14 1720      TOTAL TIME TAKING CARE OF THIS PATIENT: 45 minutes.    @[facility] @  on 09/28/2014 at 11:42 AM  Between 7am to 6pm - Pager - [phone]  After 6pm go to [email or web address] - password [email or web address] [facility]  [place] [facility]  Office  [phone]  CC: Primary care physician; No primary care provider on file.

## 2014-09-28 NOTE — Progress Notes (Signed)
CSW spoke with pt's dtr and pt re: transportation home today. Per Deanna ArtisKeisha with [facility], they do not have transportation over weekend. CSW offered pt a taxi ride home, as pt reports "I can't walk." Per PT, pt ambulating short distances with Min assist. Pt requesting EMS. CSW made pt aware of potential cost associated with EMS ride, as it is not medically necessary. Pt refused EMS due to cost. CSW spoke with pt's dtr, [name] [phone], who reports she and pt's granddtr will provide transportation and assist with getting pt into her home. RN updated. CSW signing off. Dellie BurnsJosie Ibeth Fahmy, MSW, [place] [phone] (weekend coverage)  '

## 2014-09-28 NOTE — Discharge Instructions (Signed)
*  take all medications as ordered. *practice good hand hygiene. *drink plenty of fluids.  *notify MD with any questions or concerns.

## 2014-09-28 NOTE — Care Management Note (Signed)
Case Management Note  Patient Details  Name: Pecola LeisureDoris L Wadhwa MRN: [id number] Date of Birth: 03/26/1941  Subjective/Objective:   [facility] [place] [facility] the 2 tablets of Dificid 200mg , previously requested by case management, to anyone except a [facility] employee. Deanna ArtisKeisha will then give the Dificid to Ms Manganelli.                     Expected Discharge Date:                  Expected Discharge Plan:     In-House Referral:     Discharge planning Services     Post Acute Care Choice:    Choice offered to:     DME Arranged:    DME Agency:     HH Arranged:    HH Agency:     Status of Service:     Medicare Important Message Given:  Yes Date Medicare IM Given:  09/26/14 Medicare IM give by:  Serena Colonelick Berkhead NCM Date Additional Medicare IM Given:    Additional Medicare Important Message give by:     If discussed at Long Length of Stay Meetings, dates discussed:    Additional Comments:  Grabiel Schmutz A, RN 09/28/2014, 2:09 PM

## 2014-09-28 NOTE — Progress Notes (Signed)
Pt rested quietly during evening, no acute distress. Enteric precautions maintained. Awaiting results of 2nd cdiff stool sent in last night. Will cont to monitor.

## 2014-09-28 NOTE — Progress Notes (Signed)
Let nurse know 

## 2014-11-18 LAB — CLOSTRIDIUM DIFFICILE BY PCR: Toxigenic C. Difficile by PCR: POSITIVE — AB

## 2014-11-24 LAB — CBC WITH DIFFERENTIAL/PLATELET
Basophil #: 0 10*3/uL (ref 0.0–0.1)
Basophil %: 0.2 %
EOS ABS: 0.3 10*3/uL (ref 0.0–0.7)
Eosinophil %: 1 %
HCT: 34.5 % — ABNORMAL LOW (ref 35.0–47.0)
HGB: 11.2 g/dL — AB (ref 12.0–16.0)
LYMPHS ABS: 1.2 10*3/uL (ref 1.0–3.6)
LYMPHS PCT: 4.9 %
MCH: 28.5 pg (ref 26.0–34.0)
MCHC: 32.3 g/dL (ref 32.0–36.0)
MCV: 88 fL (ref 80–100)
MONOS PCT: 5.5 %
Monocyte #: 1.4 x10 3/mm — ABNORMAL HIGH (ref 0.2–0.9)
NEUTROS ABS: 22.4 10*3/uL — AB (ref 1.4–6.5)
Neutrophil %: 88.4 %
PLATELETS: 150 10*3/uL (ref 150–440)
RBC: 3.91 10*6/uL (ref 3.80–5.20)
RDW: 14.9 % — ABNORMAL HIGH (ref 11.5–14.5)
WBC: 25.3 10*3/uL — AB (ref 3.6–11.0)

## 2015-11-25 ENCOUNTER — Inpatient Hospital Stay
Admission: EM | Admit: 2015-11-25 | Discharge: 2015-11-27 | DRG: 871 | Disposition: A | Discharge status: Home / Self Care | Payer: Medicare (Managed Care) | Attending: Internal Medicine | Admitting: Internal Medicine

## 2015-11-25 ENCOUNTER — Encounter: Payer: Self-pay | Admitting: Intensive Care

## 2015-11-25 ENCOUNTER — Emergency Department: Payer: Medicare (Managed Care)

## 2015-11-25 DIAGNOSIS — R778 Other specified abnormalities of plasma proteins: Secondary | ICD-10-CM

## 2015-11-25 DIAGNOSIS — F329 Major depressive disorder, single episode, unspecified: Secondary | ICD-10-CM | POA: Diagnosis present

## 2015-11-25 DIAGNOSIS — H9209 Otalgia, unspecified ear: Secondary | ICD-10-CM | POA: Diagnosis present

## 2015-11-25 DIAGNOSIS — G9341 Metabolic encephalopathy: Secondary | ICD-10-CM | POA: Diagnosis present

## 2015-11-25 DIAGNOSIS — I248 Other forms of acute ischemic heart disease: Secondary | ICD-10-CM | POA: Diagnosis present

## 2015-11-25 DIAGNOSIS — Z66 Do not resuscitate: Secondary | ICD-10-CM | POA: Diagnosis present

## 2015-11-25 DIAGNOSIS — N390 Urinary tract infection, site not specified: Secondary | ICD-10-CM | POA: Diagnosis present

## 2015-11-25 DIAGNOSIS — E039 Hypothyroidism, unspecified: Secondary | ICD-10-CM | POA: Diagnosis present

## 2015-11-25 DIAGNOSIS — G4733 Obstructive sleep apnea (adult) (pediatric): Secondary | ICD-10-CM | POA: Diagnosis present

## 2015-11-25 DIAGNOSIS — Z880 Allergy status to penicillin: Secondary | ICD-10-CM

## 2015-11-25 DIAGNOSIS — D696 Thrombocytopenia, unspecified: Secondary | ICD-10-CM | POA: Diagnosis present

## 2015-11-25 DIAGNOSIS — I110 Hypertensive heart disease with heart failure: Secondary | ICD-10-CM | POA: Diagnosis present

## 2015-11-25 DIAGNOSIS — R7989 Other specified abnormal findings of blood chemistry: Secondary | ICD-10-CM

## 2015-11-25 DIAGNOSIS — J449 Chronic obstructive pulmonary disease, unspecified: Secondary | ICD-10-CM | POA: Diagnosis present

## 2015-11-25 DIAGNOSIS — Z9981 Dependence on supplemental oxygen: Secondary | ICD-10-CM

## 2015-11-25 DIAGNOSIS — A419 Sepsis, unspecified organism: Secondary | ICD-10-CM | POA: Diagnosis present

## 2015-11-25 DIAGNOSIS — Z885 Allergy status to narcotic agent status: Secondary | ICD-10-CM

## 2015-11-25 DIAGNOSIS — E119 Type 2 diabetes mellitus without complications: Secondary | ICD-10-CM | POA: Diagnosis present

## 2015-11-25 DIAGNOSIS — A4151 Sepsis due to Escherichia coli [E. coli]: Principal | ICD-10-CM | POA: Diagnosis present

## 2015-11-25 DIAGNOSIS — E876 Hypokalemia: Secondary | ICD-10-CM | POA: Diagnosis present

## 2015-11-25 DIAGNOSIS — R531 Weakness: Secondary | ICD-10-CM

## 2015-11-25 DIAGNOSIS — N170 Acute kidney failure with tubular necrosis: Secondary | ICD-10-CM | POA: Diagnosis present

## 2015-11-25 DIAGNOSIS — K219 Gastro-esophageal reflux disease without esophagitis: Secondary | ICD-10-CM | POA: Diagnosis present

## 2015-11-25 DIAGNOSIS — Z888 Allergy status to other drugs, medicaments and biological substances status: Secondary | ICD-10-CM

## 2015-11-25 LAB — CBC WITH DIFFERENTIAL/PLATELET
BASOS ABS: 0 10*3/uL (ref 0–0.1)
BASOS PCT: 0 %
EOS PCT: 1 %
Eosinophils Absolute: 0.1 10*3/uL (ref 0–0.7)
HCT: 35.1 % (ref 35.0–47.0)
Hemoglobin: 12.1 g/dL (ref 12.0–16.0)
Lymphocytes Relative: 3 %
Lymphs Abs: 0.4 10*3/uL — ABNORMAL LOW (ref 1.0–3.6)
MCH: 31.5 pg (ref 26.0–34.0)
MCHC: 34.5 g/dL (ref 32.0–36.0)
MCV: 91.3 fL (ref 80.0–100.0)
MONO ABS: 0.1 10*3/uL — AB (ref 0.2–0.9)
Monocytes Relative: 1 %
Neutro Abs: 11.3 10*3/uL — ABNORMAL HIGH (ref 1.4–6.5)
Neutrophils Relative %: 95 %
PLATELETS: 103 10*3/uL — AB (ref 150–440)
RBC: 3.85 MIL/uL (ref 3.80–5.20)
RDW: 12.6 % (ref 11.5–14.5)
WBC: 11.8 10*3/uL — ABNORMAL HIGH (ref 3.6–11.0)

## 2015-11-25 LAB — BLOOD CULTURE ID PANEL (REFLEXED)
Acinetobacter baumannii: NOT DETECTED
CANDIDA ALBICANS: NOT DETECTED
CANDIDA GLABRATA: NOT DETECTED
CANDIDA KRUSEI: NOT DETECTED
CANDIDA PARAPSILOSIS: NOT DETECTED
CANDIDA TROPICALIS: NOT DETECTED
Carbapenem resistance: NOT DETECTED
ENTEROBACTER CLOACAE COMPLEX: NOT DETECTED
ESCHERICHIA COLI: DETECTED — AB
Enterobacteriaceae species: DETECTED — AB
Enterococcus species: NOT DETECTED
Haemophilus influenzae: NOT DETECTED
KLEBSIELLA PNEUMONIAE: NOT DETECTED
Klebsiella oxytoca: NOT DETECTED
Listeria monocytogenes: NOT DETECTED
Methicillin resistance: NOT DETECTED
Neisseria meningitidis: NOT DETECTED
PROTEUS SPECIES: NOT DETECTED
Pseudomonas aeruginosa: NOT DETECTED
SERRATIA MARCESCENS: NOT DETECTED
STREPTOCOCCUS PNEUMONIAE: NOT DETECTED
Staphylococcus aureus (BCID): NOT DETECTED
Staphylococcus species: NOT DETECTED
Streptococcus agalactiae: NOT DETECTED
Streptococcus pyogenes: NOT DETECTED
Streptococcus species: NOT DETECTED
Vancomycin resistance: NOT DETECTED

## 2015-11-25 LAB — TROPONIN I: TROPONIN I: 0.06 ng/mL — AB (ref <0.03)

## 2015-11-25 LAB — COMPREHENSIVE METABOLIC PANEL
ALT: 18 U/L (ref 14–54)
ANION GAP: 12 (ref 5–15)
AST: 29 U/L (ref 15–41)
Albumin: 3.4 g/dL — ABNORMAL LOW (ref 3.5–5.0)
Alkaline Phosphatase: 142 U/L — ABNORMAL HIGH (ref 38–126)
BUN: 25 mg/dL — AB (ref 6–20)
CALCIUM: 9.1 mg/dL (ref 8.9–10.3)
CO2: 27 mmol/L (ref 22–32)
CREATININE: 1.28 mg/dL — AB (ref 0.44–1.00)
Chloride: 99 mmol/L — ABNORMAL LOW (ref 101–111)
GFR, EST AFRICAN AMERICAN: 46 mL/min — AB (ref >60)
GFR, EST NON AFRICAN AMERICAN: 40 mL/min — AB (ref >60)
GLUCOSE: 154 mg/dL — AB (ref 65–99)
Potassium: 3.1 mmol/L — ABNORMAL LOW (ref 3.5–5.1)
SODIUM: 138 mmol/L (ref 135–145)
Total Bilirubin: 1 mg/dL (ref 0.3–1.2)
Total Protein: 6.9 g/dL (ref 6.5–8.1)

## 2015-11-25 LAB — GLUCOSE, CAPILLARY
GLUCOSE-CAPILLARY: 195 mg/dL — AB (ref 65–99)
GLUCOSE-CAPILLARY: 238 mg/dL — AB (ref 65–99)
Glucose-Capillary: 154 mg/dL — ABNORMAL HIGH (ref 65–99)
Glucose-Capillary: 294 mg/dL — ABNORMAL HIGH (ref 65–99)

## 2015-11-25 LAB — BRAIN NATRIURETIC PEPTIDE: B NATRIURETIC PEPTIDE 5: 375 pg/mL — AB (ref 0.0–100.0)

## 2015-11-25 LAB — URINALYSIS COMPLETE WITH MICROSCOPIC (ARMC ONLY)
BILIRUBIN URINE: NEGATIVE
GLUCOSE, UA: NEGATIVE mg/dL
KETONES UR: NEGATIVE mg/dL
Nitrite: POSITIVE — AB
PH: 5 (ref 5.0–8.0)
Protein, ur: 100 mg/dL — AB
Specific Gravity, Urine: 1.012 (ref 1.005–1.030)

## 2015-11-25 LAB — LACTIC ACID, PLASMA
LACTIC ACID, VENOUS: 2.5 mmol/L — AB (ref 0.5–1.9)
LACTIC ACID, VENOUS: 1.9 mmol/L (ref 0.5–1.9)

## 2015-11-25 MED ORDER — TRAZODONE HCL 100 MG PO TABS
100.0000 mg | ORAL_TABLET | Freq: Every day | ORAL | Status: DC
  Administered 2015-11-25: 100 mg via ORAL
  Filled 2015-11-25 (×3): qty 1

## 2015-11-25 MED ORDER — ASPIRIN EC 81 MG PO TBEC
81.0000 mg | DELAYED_RELEASE_TABLET | Freq: Every day | ORAL | Status: DC
  Administered 2015-11-25 – 2015-11-27 (×3): 81 mg via ORAL
  Filled 2015-11-25 (×3): qty 1

## 2015-11-25 MED ORDER — VITAMINS A & D EX OINT
1.0000 | TOPICAL_OINTMENT | Freq: Two times a day (BID) | CUTANEOUS | Status: DC
Start: 2015-11-25 — End: 2015-11-27
  Filled 2015-11-25: qty 1

## 2015-11-25 MED ORDER — ENOXAPARIN SODIUM 40 MG/0.4ML ~~LOC~~ SOLN
40.0000 mg | SUBCUTANEOUS | Status: DC
  Administered 2015-11-25 – 2015-11-26 (×2): 40 mg via SUBCUTANEOUS
  Filled 2015-11-25 (×2): qty 0.4

## 2015-11-25 MED ORDER — DEXTROSE 5 % IV SOLN
2.0000 g | Freq: Every day | INTRAVENOUS | Status: DC
  Administered 2015-11-25: 14:00:00 2 g via INTRAVENOUS
  Filled 2015-11-25 (×2): qty 2

## 2015-11-25 MED ORDER — DORZOLAMIDE HCL-TIMOLOL MAL 2-0.5 % OP SOLN
1.0000 [drp] | Freq: Two times a day (BID) | OPHTHALMIC | Status: DC
  Administered 2015-11-25 – 2015-11-27 (×5): 1 [drp] via OPHTHALMIC
  Filled 2015-11-25: qty 10

## 2015-11-25 MED ORDER — FERROUS SULFATE 325 (65 FE) MG PO TABS
325.0000 mg | ORAL_TABLET | Freq: Every day | ORAL | Status: DC
  Administered 2015-11-25 – 2015-11-26 (×2): 325 mg via ORAL
  Filled 2015-11-25 (×2): qty 1

## 2015-11-25 MED ORDER — ACETAMINOPHEN 325 MG PO TABS
650.0000 mg | ORAL_TABLET | Freq: Four times a day (QID) | ORAL | Status: DC | PRN
  Administered 2015-11-26 – 2015-11-27 (×2): 650 mg via ORAL
  Filled 2015-11-25 (×2): qty 2

## 2015-11-25 MED ORDER — POTASSIUM CHLORIDE IN NACL 20-0.9 MEQ/L-% IV SOLN
INTRAVENOUS | Status: DC
  Administered 2015-11-25 – 2015-11-26 (×3): via INTRAVENOUS
  Filled 2015-11-25 (×4): qty 1000

## 2015-11-25 MED ORDER — MOMETASONE FURO-FORMOTEROL FUM 200-5 MCG/ACT IN AERO
2.0000 | INHALATION_SPRAY | Freq: Two times a day (BID) | RESPIRATORY_TRACT | Status: DC
  Administered 2015-11-25 – 2015-11-27 (×5): 2 via RESPIRATORY_TRACT
  Filled 2015-11-25: qty 8.8

## 2015-11-25 MED ORDER — ONDANSETRON HCL 4 MG/2ML IJ SOLN: 4.0000 mg | Freq: Four times a day (QID) | INTRAMUSCULAR | Status: DC | PRN

## 2015-11-25 MED ORDER — ALBUTEROL SULFATE (2.5 MG/3ML) 0.083% IN NEBU: 2.5000 mg | INHALATION_SOLUTION | Freq: Four times a day (QID) | RESPIRATORY_TRACT | Status: DC | PRN

## 2015-11-25 MED ORDER — DOCUSATE SODIUM 100 MG PO CAPS
100.0000 mg | ORAL_CAPSULE | Freq: Every day | ORAL | Status: DC
  Administered 2015-11-26: 100 mg via ORAL
  Filled 2015-11-25 (×2): qty 1

## 2015-11-25 MED ORDER — HYDROCORTISONE 2.5 % RE CREA: 1.0000 "application " | TOPICAL_CREAM | Freq: Three times a day (TID) | RECTAL | Status: DC | PRN

## 2015-11-25 MED ORDER — POTASSIUM CHLORIDE CRYS ER 20 MEQ PO TBCR
20.0000 meq | EXTENDED_RELEASE_TABLET | Freq: Two times a day (BID) | ORAL | Status: DC
  Administered 2015-11-25 – 2015-11-27 (×5): 20 meq via ORAL
  Filled 2015-11-25 (×5): qty 1

## 2015-11-25 MED ORDER — LORATADINE 10 MG PO TABS
10.0000 mg | ORAL_TABLET | Freq: Every day | ORAL | Status: DC
  Administered 2015-11-25 – 2015-11-27 (×3): 10 mg via ORAL
  Filled 2015-11-25 (×3): qty 1

## 2015-11-25 MED ORDER — FLUTICASONE PROPIONATE 50 MCG/ACT NA SUSP
2.0000 | Freq: Every day | NASAL | Status: DC
  Administered 2015-11-25 – 2015-11-27 (×3): 2 via NASAL
  Filled 2015-11-25: qty 16

## 2015-11-25 MED ORDER — INSULIN ASPART 100 UNIT/ML ~~LOC~~ SOLN
0.0000 [IU] | Freq: Every day | SUBCUTANEOUS | Status: DC
  Administered 2015-11-25: 22:00:00 3 [IU] via SUBCUTANEOUS
  Filled 2015-11-25: qty 3

## 2015-11-25 MED ORDER — LATANOPROST 0.005 % OP SOLN
1.0000 [drp] | Freq: Every day | OPHTHALMIC | Status: DC
  Administered 2015-11-25 – 2015-11-26 (×2): 1 [drp] via OPHTHALMIC
  Filled 2015-11-25: qty 2.5

## 2015-11-25 MED ORDER — ONDANSETRON HCL 4 MG PO TABS: 4.0000 mg | ORAL_TABLET | Freq: Four times a day (QID) | ORAL | Status: DC | PRN

## 2015-11-25 MED ORDER — METOCLOPRAMIDE HCL 10 MG PO TABS
5.0000 mg | ORAL_TABLET | Freq: Three times a day (TID) | ORAL | Status: DC
  Administered 2015-11-25 – 2015-11-27 (×6): 5 mg via ORAL
  Filled 2015-11-25 (×4): qty 1
  Filled 2015-11-25: qty 2
  Filled 2015-11-25: qty 1

## 2015-11-25 MED ORDER — CALCIUM CARBONATE ANTACID 500 MG PO CHEW
1.0000 | CHEWABLE_TABLET | Freq: Two times a day (BID) | ORAL | Status: DC
  Administered 2015-11-25 – 2015-11-27 (×5): 200 mg via ORAL
  Filled 2015-11-25 (×5): qty 1

## 2015-11-25 MED ORDER — LEVOTHYROXINE SODIUM 50 MCG PO TABS
50.0000 ug | ORAL_TABLET | Freq: Every day | ORAL | Status: DC
  Administered 2015-11-25 – 2015-11-27 (×3): 50 ug via ORAL
  Filled 2015-11-25 (×3): qty 1

## 2015-11-25 MED ORDER — ACETAMINOPHEN 650 MG RE SUPP: 650.0000 mg | Freq: Four times a day (QID) | RECTAL | Status: DC | PRN

## 2015-11-25 MED ORDER — VANCOMYCIN HCL IN DEXTROSE 1-5 GM/200ML-% IV SOLN
1000.0000 mg | Freq: Once | INTRAVENOUS | Status: AC
  Administered 2015-11-25: 1000 mg via INTRAVENOUS
  Filled 2015-11-25: qty 200

## 2015-11-25 MED ORDER — SODIUM CHLORIDE 0.9 % IV SOLN
2.0000 g | Freq: Three times a day (TID) | INTRAVENOUS | Status: DC
  Administered 2015-11-26 – 2015-11-27 (×5): 2 g via INTRAVENOUS
  Filled 2015-11-25 (×8): qty 2

## 2015-11-25 MED ORDER — INSULIN ASPART 100 UNIT/ML ~~LOC~~ SOLN
0.0000 [IU] | Freq: Three times a day (TID) | SUBCUTANEOUS | Status: DC
  Administered 2015-11-26: 13:00:00 2 [IU] via SUBCUTANEOUS
  Administered 2015-11-26: 3 [IU] via SUBCUTANEOUS
  Administered 2015-11-26: 2 [IU] via SUBCUTANEOUS
  Administered 2015-11-27: 3 [IU] via SUBCUTANEOUS
  Administered 2015-11-27: 08:00:00 2 [IU] via SUBCUTANEOUS
  Filled 2015-11-25: qty 2
  Filled 2015-11-25: qty 3
  Filled 2015-11-25 (×2): qty 2

## 2015-11-25 MED ORDER — SERTRALINE HCL 50 MG PO TABS
150.0000 mg | ORAL_TABLET | Freq: Every day | ORAL | Status: DC
  Administered 2015-11-25 – 2015-11-27 (×3): 150 mg via ORAL
  Filled 2015-11-25 (×3): qty 1

## 2015-11-25 MED ORDER — SODIUM CHLORIDE 0.9 % IV BOLUS (SEPSIS)
1000.0000 mL | Freq: Once | INTRAVENOUS | Status: AC
  Administered 2015-11-25: 1000 mL via INTRAVENOUS

## 2015-11-25 MED ORDER — PIPERACILLIN-TAZOBACTAM 3.375 G IVPB 30 MIN
3.3750 g | Freq: Once | INTRAVENOUS | Status: AC
  Administered 2015-11-25: 3.375 g via INTRAVENOUS
  Filled 2015-11-25: qty 50

## 2015-11-25 NOTE — ED Provider Notes (Addendum)
[facility] Emergency Department Provider Note   ____________________________________________    I have reviewed the triage vital signs and the nursing notes.   HISTORY  Chief Complaint Weakness     HPI Laurie Ross is a 75 y.o. female who presents with complaints of weakness and "not feeling well". Reportedly patient has a history of urinary tract infections. She feels weak with a low appetite. She also complains of mild shortness of breath. She has felt chills but she is unsure if he has a fever. She denies abdominal pain. Review of records demonstrates history of CHF diabetes COPD recent admission for C. difficile but no diarrhea reported today.   Past Medical History:  Diagnosis Date  . CHF (congestive heart failure) (HCC)   . COPD (chronic obstructive pulmonary disease) (HCC)    occasional home O2 use  . Diabetes mellitus without complication (HCC)   . GERD (gastroesophageal reflux disease)   . Hx of Clostridium difficile infection March 2016  . Hypertension   . Obstructive sleep apnea     Patient Active Problem List   Diagnosis Date Noted  . Diarrhea 09/24/2014    History reviewed. No pertinent surgical history.  Prior to Admission medications   Medication Sig Start Date End Date Taking? Authorizing Provider  acetaminophen (TYLENOL) 325 MG tablet Take 2 tablets (650 mg total) by mouth every 6 (six) hours as needed for mild pain (or Fever >/= 101). 09/28/14   Ramonita Lab, MD  amLODipine-atorvastatin (CADUET) 5-10 MG per tablet Take 1 tablet by mouth at bedtime.    Historical Provider, MD  aspirin EC 81 MG tablet Take 81 mg by mouth daily.    Historical Provider, MD  atenolol (TENORMIN) 50 MG tablet Take 50 mg by mouth daily.    Historical Provider, MD  CALCIUM-VITAMIN D PO Take 1 tablet by mouth 2 (two) times daily. osteoporosis    Historical Provider, MD  enalapril (VASOTEC) 20 MG tablet Take 20 mg by mouth 2 (two) times daily.     Historical Provider, MD  ferrous sulfate 325 (65 FE) MG tablet Take 325 mg by mouth at bedtime.    Historical Provider, MD  furosemide (LASIX) 80 MG tablet Take 80 mg by mouth daily.    Historical Provider, MD  HYDROcodone-acetaminophen (NORCO/VICODIN) 5-325 MG per tablet Take 1-2 tablets by mouth every 6 (six) hours as needed for moderate pain or severe pain. 09/28/14   Ramonita Lab, MD  levothyroxine (SYNTHROID, LEVOTHROID) 50 MCG tablet Take 50 mcg by mouth daily.    Historical Provider, MD  ranitidine (ZANTAC) 150 MG tablet Take 150 mg by mouth 2 (two) times daily.    Historical Provider, MD  sertraline (ZOLOFT) 100 MG tablet Take 150 mg by mouth daily.    Historical Provider, MD  spironolactone (ALDACTONE) 25 MG tablet Take 50 mg by mouth daily. For fluid    Historical Provider, MD  traZODone (DESYREL) 100 MG tablet Take 100 mg by mouth at bedtime.    Historical Provider, MD  .   Allergies Penicillins  Family History  Problem Relation Age of Onset  . Family history unknown: Yes    Social History Social History  Substance Use Topics  . Smoking status: Never Smoker  . Smokeless tobacco: Never Used  . Alcohol use No    Review of Systems  Constitutional: Positive chills Eyes: No visual changes.  ENT: Decreased appetite Cardiovascular: Denies chest pain. Respiratory: Positive shortness of breath Gastrointestinal: No abdominal pain.  Mild nausea Genitourinary: Mild dysuria Musculoskeletal: Negative for back pain. Skin: Negative for rash. Neurological: Negative for headaches  10-point ROS otherwise negative.  ____________________________________________   PHYSICAL EXAM:  VITAL SIGNS: ED Triage Vitals [11/25/15 0746]  Enc Vitals Group     BP (!) 152/78     Pulse Rate 95     Resp (!) 24     Temp (!) 100.9 F (38.3 C)     Temp Source Oral     SpO2 93 %     Weight 183 lb 1.6 oz (83.1 kg)     Height      Head Circumference      Peak Flow      Pain Score      Pain  Loc      Pain Edu?      Excl. in GC?     Constitutional: Alert and oriented. Ill-appearing Eyes: Conjunctivae are normal.  Head: Atraumatic. Nose: No congestion/rhinnorhea. Mouth/Throat: Mucous membranes are dry   Cardiovascular: Normal rate, regular rhythm. Grossly normal heart sounds.  Good peripheral circulation. Respiratory: Mild tachypnea, bibasilar rales, no wheezes. Gastrointestinal: Soft and nontender. No distention.  No CVA tenderness. Genitourinary: No rash or erythema Musculoskeletal: No lower extremity tenderness nor edema.  Warm and well perfused Neurologic:  Normal speech and language. No gross focal neurologic deficits are appreciated.  Skin:  Skin is warm, dry and intact. No rash noted. Psychiatric: Mood and affect are normal. Speech and behavior are normal.  ____________________________________________   LABS (all labs ordered are listed, but only abnormal results are displayed)  Labs Reviewed  LACTIC ACID, PLASMA - Abnormal; Notable for the following:       Result Value   Lactic Acid, Venous 2.5 (*)    All other components within normal limits  COMPREHENSIVE METABOLIC PANEL - Abnormal; Notable for the following:    Potassium 3.1 (*)    Chloride 99 (*)    Glucose, Bld 154 (*)    BUN 25 (*)    Creatinine, Ser 1.28 (*)    Albumin 3.4 (*)    Alkaline Phosphatase 142 (*)    GFR calc non Af Amer 40 (*)    GFR calc Af Amer 46 (*)    All other components within normal limits  CBC WITH DIFFERENTIAL/PLATELET - Abnormal; Notable for the following:    WBC 11.8 (*)    Platelets 103 (*)    Neutro Abs 11.3 (*)    Lymphs Abs 0.4 (*)    Monocytes Absolute 0.1 (*)    All other components within normal limits  TROPONIN I - Abnormal; Notable for the following:    Troponin I 0.06 (*)    All other components within normal limits  BRAIN NATRIURETIC PEPTIDE - Abnormal; Notable for the following:    B Natriuretic Peptide 375.0 (*)    All other components within normal  limits  URINALYSIS COMPLETEWITH MICROSCOPIC ([facility] ONLY) - Abnormal; Notable for the following:    Color, Urine YELLOW (*)    APPearance CLEAR (*)    Hgb urine dipstick 2+ (*)    Protein, ur 100 (*)    Nitrite POSITIVE (*)    Leukocytes, UA 1+ (*)    Bacteria, UA MANY (*)    Squamous Epithelial / LPF 0-5 (*)    All other components within normal limits  GLUCOSE, CAPILLARY - Abnormal; Notable for the following:    Glucose-Capillary 154 (*)    All other components within normal limits  CULTURE,  BLOOD (ROUTINE X 2)  CULTURE, BLOOD (ROUTINE X 2)  URINE CULTURE  LACTIC ACID, PLASMA   ____________________________________________  EKG  ED ECG REPORT I, Jene Every, the attending physician, personally viewed and interpreted this ECG.  Date: 11/25/2015 EKG Time: 7:44 AM Rate: 95 Rhythm: normal sinus rhythm QRS Axis: normal Intervals: Prolonged QT ST/T Wave abnormalities: normal Conduction Disturbances: none   ____________________________________________  RADIOLOGY  Chest x-ray unremarkable ____________________________________________   PROCEDURES  Procedure(s) performed: No    Critical Care performed: No ____________________________________________   INITIAL IMPRESSION / ASSESSMENT AND PLAN / ED COURSE  Pertinent labs & imaging results that were available during my care of the patient were reviewed by me and considered in my medical decision making (see chart for details).  Patient presents with hypoxia, tachypnea, fever with a heart rate of 95. Concerning presentation for sepsis. Code sepsis called, unknown source at this time.  Clinical Course  ----------------------------------------- 9:40 AM on 11/25/2015 -----------------------------------------  Patient with urinary tract infection. Chest x-ray is unremarkable. Lactic acid is elevated, troponin is elevated. Suspect strain related to sepsis. Patient received antibiotics and will admit to the  hospitalist service for further management ____________________________________________ ----------------------------------------- 11:18 AM on 11/25/2015 ----------------------------------------- ED Sepsis - Repeat Assessment   Performed at:    11 AM  Last Vitals:    Blood pressure 108/88, pulse 85, temperature 98.7 F (37.1 C), temperature source Oral, resp. rate (!) 31, weight 83.1 kg, SpO2 96 %.  Heart:      RRR  Lungs:     Scattered wheezes  Capillary Refill:   normal  Peripheral Pulse (include location): 2+ right ulnar   Skin (include color):   pink     FINAL CLINICAL IMPRESSION(S) / ED DIAGNOSES  Final diagnoses:  UTI (lower urinary tract infection)  Sepsis, due to unspecified organism (HCC)  Generalized weakness  Elevated troponin      NEW MEDICATIONS STARTED DURING THIS VISIT:  New Prescriptions   No medications on file     Note:  This document was prepared using Dragon voice recognition software and may include unintentional dictation errors.    Jene Every, MD 11/25/15 4656    Jene Every, MD 11/25/15 [phone]

## 2015-11-25 NOTE — ED Triage Notes (Signed)
Patient arrived by EMS from home. Daughter is caregiver for patient at home. Patient has HX UTI and c/o weakness today. Patient is A&O x4. Patient was 26 on RA with EMS and upon arrival to ER room 88 on RA. Patient placed on 2L O2.

## 2015-11-25 NOTE — H&P (Signed)
[place] [facility] [place] NAME: Laurie Ross    MR#:  [id number]  DATE OF BIRTH:  Dec 07, 1940  DATE OF ADMISSION:  11/25/2015  PRIMARY CARE PHYSICIAN: No primary care provider on file.   REQUESTING/REFERRING PHYSICIAN: Dr. Lavonia Drafts  CHIEF COMPLAINT:   Chief Complaint  Patient presents with  . Weakness    HISTORY OF PRESENT ILLNESS:  Laurie Ross  is a 75 y.o. female with a known history of COPD, congestive heart failure, diabetes type 2 without consultation, GERD, history of C. difficile, hypertension, obstructive sleep apnea who presents to the hospital due to weakness, lethargy and altered mental status. As per the daughter gives most of the history patient has been having nausea vomiting and diarrhea since yesterday evening. This morning she found her on the floor next to her bed. The patient was confused and not oriented to person and therefore brought to the ER for further evaluation. In the ER patient was noted to have a low-grade fever, noted to be tachycardic, with an abnormal urinalysis and elevated lactic acid. She was noted to have sepsis secondary to UTI and hospitalist services were contacted further treatment and evaluation.  PAST MEDICAL HISTORY:   Past Medical History:  Diagnosis Date  . CHF (congestive heart failure) ([place])   . COPD (chronic obstructive pulmonary disease) ([place])    occasional home O2 use  . Diabetes mellitus without complication ([place])   . GERD (gastroesophageal reflux disease)   . Hx of Clostridium difficile infection March 2016  . Hypertension   . Obstructive sleep apnea     PAST SURGICAL HISTORY:  History reviewed. No pertinent surgical history.  SOCIAL HISTORY:   Social History  Substance Use Topics  . Smoking status: Never Smoker  . Smokeless tobacco: Never Used  . Alcohol use No    FAMILY HISTORY:   Family History  Problem Relation Age of Onset  . Family history unknown: Yes    DRUG  ALLERGIES:   Allergies  Allergen Reactions  . Penicillins Itching    REVIEW OF SYSTEMS:   Review of Systems  Constitutional: Positive for malaise/fatigue. Negative for fever and weight loss.  HENT: Negative for congestion, nosebleeds and tinnitus.   Eyes: Negative for blurred vision, double vision and redness.  Respiratory: Negative for cough, hemoptysis and shortness of breath.   Cardiovascular: Negative for chest pain, orthopnea, leg swelling and PND.  Gastrointestinal: Positive for diarrhea, nausea and vomiting. Negative for melena.  Genitourinary: Negative for dysuria, hematuria and urgency.  Musculoskeletal: Negative for falls and joint pain.  Neurological: Positive for weakness. Negative for dizziness, tingling, sensory change, focal weakness, seizures and headaches.  Endo/Heme/Allergies: Negative for polydipsia. Does not bruise/bleed easily.  Psychiatric/Behavioral: Negative for depression and memory loss. The patient is not nervous/anxious.     MEDICATIONS AT HOME:   Prior to Admission medications   Medication Sig Start Date End Date Taking? Authorizing Provider  acetaminophen (TYLENOL) 325 MG tablet Take 2 tablets (650 mg total) by mouth every 6 (six) hours as needed for mild pain (or Fever >/= 101). 09/28/14   Nicholes Mango, MD  amLODipine-atorvastatin (CADUET) 5-10 MG per tablet Take 1 tablet by mouth at bedtime.    Historical Provider, MD  aspirin EC 81 MG tablet Take 81 mg by mouth daily.    Historical Provider, MD  atenolol (TENORMIN) 50 MG tablet Take 50 mg by mouth daily.    Historical Provider, MD  CALCIUM-VITAMIN D PO Take 1  tablet by mouth 2 (two) times daily. osteoporosis    Historical Provider, MD  enalapril (VASOTEC) 20 MG tablet Take 20 mg by mouth 2 (two) times daily.    Historical Provider, MD  ferrous sulfate 325 (65 FE) MG tablet Take 325 mg by mouth at bedtime.    Historical Provider, MD  furosemide (LASIX) 80 MG tablet Take 80 mg by mouth daily.     Historical Provider, MD  HYDROcodone-acetaminophen (NORCO/VICODIN) 5-325 MG per tablet Take 1-2 tablets by mouth every 6 (six) hours as needed for moderate pain or severe pain. 09/28/14   Nicholes Mango, MD  levothyroxine (SYNTHROID, LEVOTHROID) 50 MCG tablet Take 50 mcg by mouth daily.    Historical Provider, MD  ranitidine (ZANTAC) 150 MG tablet Take 150 mg by mouth 2 (two) times daily.    Historical Provider, MD  sertraline (ZOLOFT) 100 MG tablet Take 150 mg by mouth daily.    Historical Provider, MD  spironolactone (ALDACTONE) 25 MG tablet Take 50 mg by mouth daily. For fluid    Historical Provider, MD  traZODone (DESYREL) 100 MG tablet Take 100 mg by mouth at bedtime.    Historical Provider, MD      VITAL SIGNS:  Blood pressure 115/63, pulse 80, temperature (!) 100.9 F (38.3 C), temperature source Oral, resp. rate (!) 23, weight 83.1 kg (183 lb 1.6 oz), SpO2 96 %.  PHYSICAL EXAMINATION:  Physical Exam  GENERAL:  75 y.o.-year-old patient lying in bed in no acute distress.  EYES: Pupils equal, round, reactive to light and accommodation. No scleral icterus. Extraocular muscles intact.  HEENT: Head atraumatic, normocephalic. Oropharynx and nasopharynx clear. No oropharyngeal erythema, moist oral mucosa  NECK:  Supple, no jugular venous distention. No thyroid enlargement, no tenderness.  LUNGS: Normal breath sounds bilaterally, no wheezing, rales, rhonchi. No use of accessory muscles of respiration.  CARDIOVASCULAR: S1, S2 RRR. II/VI SEM at RSB, No rubs, gallops, clicks.  ABDOMEN: Soft, nontender, nondistended. Bowel sounds present. No organomegaly or mass.  EXTREMITIES: No pedal edema, cyanosis, or clubbing. + 2 pedal & radial pulses b/l.   NEUROLOGIC: Cranial nerves II through XII are intact. No focal Motor or sensory deficits appreciated b/l PSYCHIATRIC: The patient is alert and oriented x 2.  SKIN: No obvious rash, lesion, or ulcer.   LABORATORY PANEL:   CBC  Recent Labs Lab  11/25/15 0815  WBC 11.8*  HGB 12.1  HCT 35.1  PLT 103*   ------------------------------------------------------------------------------------------------------------------  Chemistries   Recent Labs Lab 11/25/15 0815  NA 138  K 3.1*  CL 99*  CO2 27  GLUCOSE 154*  BUN 25*  CREATININE 1.28*  CALCIUM 9.1  AST 29  ALT 18  ALKPHOS 142*  BILITOT 1.0   ------------------------------------------------------------------------------------------------------------------  Cardiac Enzymes  Recent Labs Lab 11/25/15 0815  TROPONINI 0.06*   ------------------------------------------------------------------------------------------------------------------  RADIOLOGY:  Dg Chest Port 1 View  Result Date: 11/25/2015 CLINICAL DATA:  Weakness and hypertension EXAM: PORTABLE CHEST 1 VIEW COMPARISON:  None. FINDINGS: There is no edema or consolidation. Heart is borderline enlarged with pulmonary vascularity within normal limits. No adenopathy. There is atherosclerotic calcification in the aorta. There is degenerative change in the thoracic spine and in each shoulder. IMPRESSION: Borderline cardiac enlargement. No edema or consolidation. Aortic atherosclerosis. Electronically Signed   By: Lowella Grip III M.D.   On: 11/25/2015 08:35     IMPRESSION AND PLAN:   75 year old female with past medical history of COPD, CHF, hypertension, hyperlipidemia, GERD, previous history of C. difficile  colitis who presents to the hospital due to weakness, lethargy and noted to have sepsis secondary to UTI.  1. Sepsis-patient met criteria admission given her fever, tachycardia, abnormal urinalysis and elevated lactic acid. -In the ER patient was given IV vancomycin, Zosyn but I will place on just IV ceftriaxone for UTI and sepsis. -Follow blood, urine cultures and hemodynamics.  2. Urinary tract infection-cause of pt's sepsis.  - IV ceftriaxone, follow urine cultures.  3. Acute kidney injury-secondary  to sepsis/ATN. -Continue IV fluids, follow BUN/creatinine. Renal dose meds avoid nephrotoxins.  4. Hypokalemia-replace potassium orally and intravenously. -Repeat level in the morning. Check magnesium level.  5. Elevated troponin-likely Nissen with demand ischemia with underlying sepsis and UTI. No evidence of acute coronary syndrome.  6. Altered mental status-metabolic encephalopathy secondary to the underlying sepsis and UTI. -We'll follow mental status with treatment of underlying UTI and sepsis.   All the records are reviewed and case discussed with ED provider. Management plans discussed with the patient, family and they are in agreement.  CODE STATUS: DO NOT RESUSCITATE  TOTAL TIME TAKING CARE OF THIS PATIENT: 45 minutes.    Henreitta Leber M.D on 11/25/2015 at 10:05 AM  Between 7am to 6pm - Pager - [phone]  After 6pm go to [email or web address] - password [email or web address] [facility]  [facility]  Office  [phone]  CC: Primary care physician; No primary care provider on file.

## 2015-11-25 NOTE — ED Notes (Signed)
Attempted to call report. Mary RN said she would call me back once had time to look over chart

## 2015-11-25 NOTE — ED Notes (Signed)
Called Code Sepsis to Ardelle Balls, at [phone]

## 2015-11-26 LAB — BASIC METABOLIC PANEL
Anion gap: 7 (ref 5–15)
BUN: 34 mg/dL — AB (ref 6–20)
CO2: 26 mmol/L (ref 22–32)
Calcium: 8 mg/dL — ABNORMAL LOW (ref 8.9–10.3)
Chloride: 106 mmol/L (ref 101–111)
Creatinine, Ser: 1.44 mg/dL — ABNORMAL HIGH (ref 0.44–1.00)
GFR calc Af Amer: 40 mL/min — ABNORMAL LOW (ref >60)
GFR calc non Af Amer: 34 mL/min — ABNORMAL LOW (ref >60)
GLUCOSE: 155 mg/dL — AB (ref 65–99)
Potassium: 4.3 mmol/L (ref 3.5–5.1)
Sodium: 139 mmol/L (ref 135–145)

## 2015-11-26 LAB — CBC
HEMATOCRIT: 29.6 % — AB (ref 35.0–47.0)
HEMOGLOBIN: 10.2 g/dL — AB (ref 12.0–16.0)
MCH: 31.6 pg (ref 26.0–34.0)
MCHC: 34.3 g/dL (ref 32.0–36.0)
MCV: 91.9 fL (ref 80.0–100.0)
Platelets: 81 10*3/uL — ABNORMAL LOW (ref 150–440)
RBC: 3.22 MIL/uL — ABNORMAL LOW (ref 3.80–5.20)
RDW: 12.8 % (ref 11.5–14.5)
WBC: 17.6 10*3/uL — AB (ref 3.6–11.0)

## 2015-11-26 LAB — C DIFFICILE QUICK SCREEN W PCR REFLEX
C DIFFICILE (CDIFF) TOXIN: NEGATIVE
C DIFFICLE (CDIFF) ANTIGEN: NEGATIVE
C Diff interpretation: NOT DETECTED

## 2015-11-26 LAB — MAGNESIUM: Magnesium: 1.4 mg/dL — ABNORMAL LOW (ref 1.7–2.4)

## 2015-11-26 LAB — GLUCOSE, CAPILLARY
GLUCOSE-CAPILLARY: 139 mg/dL — AB (ref 65–99)
GLUCOSE-CAPILLARY: 143 mg/dL — AB (ref 65–99)
Glucose-Capillary: 183 mg/dL — ABNORMAL HIGH (ref 65–99)
Glucose-Capillary: 157 mg/dL — ABNORMAL HIGH (ref 65–99)

## 2015-11-26 MED ORDER — SODIUM CHLORIDE 0.9 % IV SOLN
INTRAVENOUS | Status: DC
  Administered 2015-11-26 – 2015-11-27 (×3): via INTRAVENOUS

## 2015-11-26 MED ORDER — MAGNESIUM SULFATE 2 GM/50ML IV SOLN
2.0000 g | Freq: Once | INTRAVENOUS | Status: AC
  Administered 2015-11-26: 13:00:00 2 g via INTRAVENOUS
  Filled 2015-11-26: qty 50

## 2015-11-26 NOTE — Progress Notes (Addendum)
Inpatient Diabetes Program Recommendations  AACE/ADA: New Consensus Statement on Inpatient Glycemic Control (2015)  Target Ranges:  Prepandial:   less than 140 mg/dL      Peak postprandial:   less than 180 mg/dL (1-2 hours)      Critically ill patients:  140 - 180 mg/dL   Lab Results  Component Value Date   GLUCAP 139 (H) 11/26/2015    Review of Glycemic Control  Results for Laurie Ross, Laurie Ross (MRN [id number]) as of 11/26/2015 12:26  Ref. Range 11/25/2015 08:01 11/25/2015 12:19 11/25/2015 16:36 11/25/2015 21:07 11/26/2015 07:36  Glucose-Capillary Latest Ref Range: 65 - 99 mg/dL 458 (H) 592 (H) 924 (H) 294 (H) 139 (H)    Diabetes history: Type 2 Outpatient Diabetes medications: Novolin 70/30 34 units qam, 15 units qpm Current orders for Inpatient glycemic control: Novolog 0-15 units tid, Novolog 0-5 units qhs  Inpatient Diabetes Program Recommendations:    Per ADA recommendations "consider performing an A1C on all patients with diabetes or hypreglycemia admitted to the hospital if not performed in the prior 3 months".  Please consider starting the patient on Lantus 15 units qday starting now and adding Novolog 3 units tid with meals. Continue Novolog correction but decrease to Novolog sensitive correction 0-9 units tid.   (Patient takes 34 units basal insulin at home and 15 units of meal time bolus insulin)  Susette Racer, RN, [place], [place], CDE Diabetes Coordinator Inpatient Diabetes Program  [phone] (Team Pager) [phone] [facility] Office) 11/26/2015 12:31 PM

## 2015-11-26 NOTE — Care Management (Signed)
Admitted to [facility] with the diagnosis of sepsis (UTI). Lives with daughter, Babette Relic, [phone]). Attends [employer] [facility] x 8 years, Monday-Wednesday-Friday. Nursing assistant from [employer] comes to her hjome Monday-Friday. Rolling Walker, wheelchair, bedside commode, and shower chair in the home. States she use to have home oxygen, but not anymore. Fell at home a couple of days ago. "I eat what I want."  [facility] about a year ago. [facility] provide prescription medications and physician care.  [facility] will transport, if needed.  Gwenette Greet RN MSN CCM [phone]

## 2015-11-26 NOTE — Care Management Important Message (Signed)
Important Message  Patient Details  Name: Laurie Ross MRN: [id number] Date of Birth: [birth year]   Medicare Important Message Given:  Yes    Gwenette Greet, RN 11/26/2015, 8:53 AM

## 2015-11-26 NOTE — Evaluation (Signed)
Physical Therapy Evaluation Patient Details Name: LAQUETA STRONACH MRN: [id number] DOB: 08-24-1940 Today's Date: 11/26/2015   History of Present Illness  Pt was admitted with weakness, malaise, SOB, and general weakness; she is currently being treated for sepsis due to UTI, and bacteremia. Her medical HX includes CHF, DM, COPD, GERD, and HTN  Clinical Impression  Physical Therapy Evaluation Patient Details Name: KASH PEZZANO MRN: [id number] DOB: 1941/03/03 Today's Date: 11/26/2015   History of Present Illness  Pt was admitted with weakness, malaise, SOB, and general weakness; she is currently being treated for sepsis due to UTI, and bacteremia. Her medical HX includes CHF, DM, COPD, GERD, and HTN  Clinical Impression  Eleah Freeh is a [age] y/o female who presents with general weakness and SOB s/p fall at home. She is min to mod assist with all functional mobility, though she is currently at a high risk for buckling with ambulation. At her baseline she is a household ambulator with RW. Today she ambulated 5' to her chair with mod assist on RA with O2 sats dropping into the mid 80's. At rest with pursed lip breathing she climbed to mid to low 90's on RA. Pt was left on RA at end of session with nurse notified. She was cooperative and able to participate in ther-ex. Pt is appropriate for skilled PT at this time to address deficits in strength, balance, coordination, endurance, gait, activity tolerance and safe use of DME. Recommend she recieve PT in SNF.    Follow Up Recommendations SNF    Equipment Recommendations  None recommended by PT    Recommendations for Other Services       Precautions / Restrictions Precautions Precautions: Fall Precaution Comments: May buckle      Mobility  Bed Mobility Overal bed mobility: Needs Assistance Bed Mobility: Supine to Sit     Supine to sit: Mod assist     General bed mobility comments: Pt is limited by strength and fatigue;  required assist to get LE off EOB and trunk support with HOB elevated   Transfers Overall transfer level: Needs assistance Equipment used: Rolling walker (2 wheeled) Transfers: Sit to/from Stand Sit to Stand: Mod assist         General transfer comment: Pt needed verbal cues for hand placement and struggled to keep from buckling initially; stablized somewhat with pregait exercise.  Ambulation/Gait Ambulation/Gait assistance: Min assist Ambulation Distance (Feet): 5 Feet Assistive device: Standard walker Gait Pattern/deviations: Decreased stride length;Shuffle;Staggering left;Staggering right;Wide base of support     General Gait Details: Pt is severely limited in strength and coordination; she takes small shuffling steps with near LOB and knee buckling with RW.  Stairs            Wheelchair Mobility    Modified Rankin (Stroke Patients Only)       Balance Overall balance assessment: Needs assistance Sitting-balance support: No upper extremity supported Sitting balance-Leahy Scale: Good Sitting balance - Comments: tolerated moderate perturbation  with no UE support   Standing balance support: Bilateral upper extremity supported Standing balance-Leahy Scale: Poor Standing balance comment: Both UEs and LEs lack the strength and coordination to provide good strength                             Pertinent Vitals/Pain Pain Assessment: No/denies pain    Home Living Family/patient expects to be discharged to:: Private residence Living Arrangements: Children   Type of Home:  Mobile home Home Access: Ramped entrance     Home Layout: One level Home Equipment: Walker - 2 wheels;Wheelchair - [employer];Shower seat      Prior Function Level of Independence: Independent with assistive device(s)         Comments: RW     Hand Dominance        Extremity/Trunk Assessment   Upper Extremity Assessment: Generalized weakness (grossly assessed  to be at least 4-/5)           Lower Extremity Assessment: Generalized weakness (Grossly assessed to be at least 4-/5)         Communication   Communication: No difficulties  Cognition Arousal/Alertness: Awake/alert Behavior During Therapy: WFL for tasks assessed/performed Overall Cognitive Status: Within Functional Limits for tasks assessed                      General Comments      Exercises Other Exercises Other Exercises: B LE: LAQ X 12 reps in sitting, hip abd X 12 in longsitting, ankle pumps with manual resistance in sitting; all with min assist, verbal, and tactile cues      Assessment/Plan    PT Assessment Patient needs continued PT services  PT Diagnosis Difficulty walking;Abnormality of gait;Generalized weakness   PT Problem List Decreased strength;Decreased range of motion;Decreased activity tolerance;Decreased balance;Decreased mobility;Decreased coordination;Decreased knowledge of use of DME;Decreased safety awareness;Cardiopulmonary status limiting activity  PT Treatment Interventions DME instruction;Gait training;Functional mobility training;Therapeutic activities;Therapeutic exercise;Balance training;Neuromuscular re-education   PT Goals (Current goals can be found in the Care Plan section) Acute Rehab PT Goals Patient Stated Goal: go home PT Goal Formulation: With patient Time For Goal Achievement: 12/10/15 Potential to Achieve Goals: Good    Frequency Min 2X/week   Barriers to discharge        Co-evaluation               End of Session Equipment Utilized During Treatment: Gait belt Activity Tolerance: Patient limited by fatigue Patient left: in chair;with call bell/phone within reach;with chair alarm set Nurse Communication: Mobility status (O2 removed)         Time: 4098-1191 PT Time Calculation (min) (ACUTE ONLY): 33 min   Charges:   PT Evaluation $PT Eval Moderate Complexity: 1 Procedure PT Treatments $Therapeutic  Exercise: 8-22 mins   PT G Codes:        Desyre Calma 2015/12/13, 5:35 PM  Cassell Smiles, SPT [phone]

## 2015-11-26 NOTE — Progress Notes (Signed)
[place] [facility] [place] NAME: Laurie Ross    MR#:  [id number]  DATE OF BIRTH:  Jan 24, 1941  SUBJECTIVE:   Pt. Here due to AMS and weakness due to UTI.  Noted to septic and now bacteremic.  Afebrile.  Hemodynamically stable. Feels better.   REVIEW OF SYSTEMS:    Review of Systems  Constitutional: Negative for chills and fever.  HENT: Negative for congestion and tinnitus.   Eyes: Negative for blurred vision and double vision.  Respiratory: Negative for cough, shortness of breath and wheezing.   Cardiovascular: Negative for chest pain, orthopnea and PND.  Gastrointestinal: Negative for abdominal pain, diarrhea, nausea and vomiting.  Genitourinary: Negative for dysuria and hematuria.  Neurological: Positive for weakness. Negative for dizziness, sensory change and focal weakness.  All other systems reviewed and are negative.   Nutrition: Heart Healthy Tolerating Diet: Yes Tolerating PT: Await Eval.    DRUG ALLERGIES:   Allergies  Allergen Reactions  . Codeine   . Glucophage [Metformin Hcl]   . Loratadine   . Penicillins Itching    Has patient had a PCN reaction causing immediate rash, facial/tongue/throat swelling, SOB or lightheadedness with hypotension: No Has patient had a PCN reaction causing severe rash involving mucus membranes or skin necrosis: No Has patient had a PCN reaction that required hospitalization No Has patient had a PCN reaction occurring within the last 10 years: No If all of the above answers are "NO", then may proceed with Cephalosporin use.     VITALS:  Blood pressure (!) 110/36, pulse 67, temperature 98.3 F (36.8 C), temperature source Oral, resp. rate 16, height 5' 1"  (1.549 m), weight 83.9 kg (185 lb), SpO2 97 %.  PHYSICAL EXAMINATION:   Physical Exam  GENERAL:  75 y.o.-year-old patient lying in the bed in no acute distress.  EYES: Pupils equal, round, reactive to light and accommodation. No scleral  icterus. Extraocular muscles intact.  HEENT: Head atraumatic, normocephalic. Oropharynx and nasopharynx clear.  NECK:  Supple, no jugular venous distention. No thyroid enlargement, no tenderness.  LUNGS: Normal breath sounds bilaterally, no wheezing, rales, rhonchi. No use of accessory muscles of respiration.  CARDIOVASCULAR: S1, S2 normal. No murmurs, rubs, or gallops.  ABDOMEN: Soft, nontender, nondistended. Bowel sounds present. No organomegaly or mass.  EXTREMITIES: No cyanosis, clubbing or edema b/l.    NEUROLOGIC: Cranial nerves II through XII are intact. No focal Motor or sensory deficits b/l.  Globally weak. PSYCHIATRIC: The patient is alert and oriented x 3.  SKIN: No obvious rash, lesion, or ulcer.    LABORATORY PANEL:   CBC  Recent Labs Lab 11/26/15 0502  WBC 17.6*  HGB 10.2*  HCT 29.6*  PLT 81*   ------------------------------------------------------------------------------------------------------------------  Chemistries   Recent Labs Lab 11/25/15 0815 11/26/15 0502  NA 138 139  K 3.1* 4.3  CL 99* 106  CO2 27 26  GLUCOSE 154* 155*  BUN 25* 34*  CREATININE 1.28* 1.44*  CALCIUM 9.1 8.0*  MG  --  1.4*  AST 29  --   ALT 18  --   ALKPHOS 142*  --   BILITOT 1.0  --    ------------------------------------------------------------------------------------------------------------------  Cardiac Enzymes  Recent Labs Lab 11/25/15 0815  TROPONINI 0.06*   ------------------------------------------------------------------------------------------------------------------  RADIOLOGY:  Dg Chest Port 1 View  Result Date: 11/25/2015 CLINICAL DATA:  Weakness and hypertension EXAM: PORTABLE CHEST 1 VIEW COMPARISON:  None. FINDINGS: There is no edema or consolidation. Heart is borderline enlarged with  pulmonary vascularity within normal limits. No adenopathy. There is atherosclerotic calcification in the aorta. There is degenerative change in the thoracic spine and in  each shoulder. IMPRESSION: Borderline cardiac enlargement. No edema or consolidation. Aortic atherosclerosis. Electronically Signed   By: Lowella Grip III M.D.   On: 11/25/2015 08:35     ASSESSMENT AND PLAN:   75 year old female with past medical history of COPD, CHF, hypertension, hyperlipidemia, GERD, previous history of C. difficile colitis who presents to the hospital due to weakness, lethargy and noted to have sepsis secondary to UTI.  1. Sepsis-patient met criteria admission given her fever, tachycardia, abnormal urinalysis and elevated lactic acid. -Continue to UTI. Urine cultures positive for Escherichia coli. Blood cultures also positive for Enterobacter and Escherichia coli. -Antibiotics changed from IV ceftriaxone to IV meropenem. Await sensitivities and cultures.  2. Urinary tract infection-cause of pt's sepsis.  -Urine cultures growing 100,000 colonies of Escherichia coli. Antibiotics changed from ceftriaxone to meropenem. Follow sensitivities.  3. Acute kidney injury-secondary to sepsis/ATN. -Continue IV fluids, follow BUN/creatinine. Renal dose meds avoid nephrotoxins.  4. Hypokalemia/hypomagnesemia-potassium level improved. -We'll give 2 g of IV magnesium sulfate today. Follow levels in the morning.  5. Elevated troponin- demand ischemia with underlying sepsis and UTI.  -Troponins trended down.  6. Altered mental status-metabolic encephalopathy secondary to the underlying sepsis and UTI. - mental status improved and will monitor.   7. Thrombocytopenia - etiology unclear. ?? Related to underlying sepsis.  - follow counts.  No acute bleeding.   8. DM - cont. SSI.   9. Hypothyroidism - cont. Synthroid.   10. Depression - cont. Zoloft.     All the records are reviewed and case discussed with Care Management/Social Workerr. Management plans discussed with the patient, family and they are in agreement.  CODE STATUS: DNR  DVT Prophylaxis: Lovenox  TOTAL  TIME TAKING CARE OF THIS PATIENT: 30 minutes.   POSSIBLE D/C IN 2-3 DAYS, DEPENDING ON CLINICAL CONDITION.   Henreitta Leber M.D on 11/26/2015 at 1:36 PM  Between 7am to 6pm - Pager - [phone]  After 6pm go to [email or web address] - password [email or web address] [facility]  [facility]  Office  [phone]  CC: Primary care physician; No primary care provider on file.

## 2015-11-27 LAB — BASIC METABOLIC PANEL
ANION GAP: 6 (ref 5–15)
BUN: 27 mg/dL — AB (ref 6–20)
CHLORIDE: 111 mmol/L (ref 101–111)
CO2: 23 mmol/L (ref 22–32)
Calcium: 8.3 mg/dL — ABNORMAL LOW (ref 8.9–10.3)
Creatinine, Ser: 1.04 mg/dL — ABNORMAL HIGH (ref 0.44–1.00)
GFR calc Af Amer: 59 mL/min — ABNORMAL LOW (ref >60)
GFR calc non Af Amer: 51 mL/min — ABNORMAL LOW (ref >60)
GLUCOSE: 140 mg/dL — AB (ref 65–99)
POTASSIUM: 4.5 mmol/L (ref 3.5–5.1)
Sodium: 140 mmol/L (ref 135–145)

## 2015-11-27 LAB — URINE CULTURE: Culture: >=100000 — AB

## 2015-11-27 LAB — CBC
HEMATOCRIT: 31.3 % — AB (ref 35.0–47.0)
HEMOGLOBIN: 10.8 g/dL — AB (ref 12.0–16.0)
MCH: 31.7 pg (ref 26.0–34.0)
MCHC: 34.5 g/dL (ref 32.0–36.0)
MCV: 91.8 fL (ref 80.0–100.0)
Platelets: 96 10*3/uL — ABNORMAL LOW (ref 150–440)
RBC: 3.4 MIL/uL — AB (ref 3.80–5.20)
RDW: 13.3 % (ref 11.5–14.5)
WBC: 14.6 10*3/uL — ABNORMAL HIGH (ref 3.6–11.0)

## 2015-11-27 LAB — GLUCOSE, CAPILLARY
GLUCOSE-CAPILLARY: 182 mg/dL — AB (ref 65–99)
Glucose-Capillary: 147 mg/dL — ABNORMAL HIGH (ref 65–99)

## 2015-11-27 MED ORDER — INSULIN ASPART 100 UNIT/ML ~~LOC~~ SOLN
3.0000 [IU] | Freq: Three times a day (TID) | SUBCUTANEOUS | Status: DC
  Administered 2015-11-27 (×2): 3 [IU] via SUBCUTANEOUS
  Filled 2015-11-27 (×2): qty 3

## 2015-11-27 MED ORDER — INSULIN GLARGINE 100 UNIT/ML ~~LOC~~ SOLN
15.0000 [IU] | Freq: Every day | SUBCUTANEOUS | Status: DC
  Administered 2015-11-27: 10:00:00 15 [IU] via SUBCUTANEOUS
  Filled 2015-11-27 (×2): qty 0.15

## 2015-11-27 MED ORDER — CEFUROXIME AXETIL 500 MG PO TABS: 500.0000 mg | ORAL_TABLET | Freq: Two times a day (BID) | ORAL | 0 refills | Status: AC

## 2015-11-27 MED ORDER — FUROSEMIDE 80 MG PO TABS: 40.0000 mg | ORAL_TABLET | Freq: Every day | ORAL | 0 refills | Status: DC

## 2015-11-27 MED ORDER — INSULIN GLARGINE 100 UNIT/ML ~~LOC~~ SOLN: 15.0000 [IU] | Freq: Every day | SUBCUTANEOUS | Status: DC

## 2015-11-27 MED ORDER — CEFUROXIME AXETIL 250 MG PO TABS: 500.0000 mg | ORAL_TABLET | Freq: Two times a day (BID) | ORAL | Status: DC

## 2015-11-27 NOTE — Discharge Summary (Addendum)
[facility] -  at [facility]   PATIENT NAME: Laurie Ross    MR#:  [id number]  DATE OF BIRTH:  07-Jul-1940  DATE OF ADMISSION:  11/25/2015 ADMITTING PHYSICIAN: Houston Siren, MD  DATE OF DISCHARGE: 11/27/2015  PRIMARY CARE PHYSICIAN: [facility] MD    ADMISSION DIAGNOSIS:  UTI (lower urinary tract infection) [N39.0] Elevated troponin [R79.89] Generalized weakness [R53.1] Sepsis, due to unspecified organism (HCC) [A41.9]  DISCHARGE DIAGNOSIS:  Active Problems:   Sepsis (HCC)   SECONDARY DIAGNOSIS:   Past Medical History:  Diagnosis Date  . CHF (congestive heart failure) (HCC)   . COPD (chronic obstructive pulmonary disease) (HCC)    occasional home O2 use  . Diabetes mellitus without complication (HCC)   . GERD (gastroesophageal reflux disease)   . Hx of Clostridium difficile infection March 2016  . Hypertension   . Obstructive sleep apnea     HOSPITAL COURSE:   75 year old female with past medical history of COPD, CHF, hypertension, hyperlipidemia, GERD, previous history of C. difficile colitis who presents to the hospital due to weakness, lethargy and noted to have sepsis secondary to UTI.  1. E coli Sepsis She presented with fever, tachycardia, abnormal urinalysis and elevated lactic acid. She was treated with IV ceftriaxone and changed to to IV meropenem due to B CID.  Urine and blood cultures are Escherichia coli which are pansensitive. She will be discharged on by mouth Ceftin. She needs to be treated for total of 2 weeks due to bacteremia.   2. Coli Urinary tract infection: Plan as stated above. 3. Acute kidney injury: This is due to sepsis with ATN. She was treated IV fluids. Creatinine has improved. Nephrotoxic agents were also discontinued.   4. Hypokalemia/hypomagnesemia: This was repleted. 5. Elevated troponin: This is due to demand ischemia and not ACS.   6. Metabolic encephalopathy, acute: This is due to sepsis and UTI  and bacteremia. Mental status is back to baseline.  7. Thrombocytopenia: This is due to sepsis.   8. DM: Patient may resume outpatient regimen with ADA diet.  9. Hypothyroidism: Continue Synthroid 10. Depression: Continue Zoloft.  11. Ear pain: Patient had no evidence of otitis media on examination.    DISCHARGE CONDITIONS AND DIET:   Stable for discharge on diabetic diet  CONSULTS OBTAINED:    DRUG ALLERGIES:   Allergies  Allergen Reactions  . Codeine   . Glucophage [Metformin Hcl]   . Loratadine   . Penicillins Itching    Has patient had a PCN reaction causing immediate rash, facial/tongue/throat swelling, SOB or lightheadedness with hypotension: No Has patient had a PCN reaction causing severe rash involving mucus membranes or skin necrosis: No Has patient had a PCN reaction that required hospitalization No Has patient had a PCN reaction occurring within the last 10 years: No If all of the above answers are "NO", then may proceed with Cephalosporin use.     DISCHARGE MEDICATIONS:   Current Discharge Medication List    START taking these medications   Details  cefUROXime (CEFTIN) 500 MG tablet Take 1 tablet (500 mg total) by mouth 2 (two) times daily with a meal. Qty: 24 tablet, Refills: 0      CONTINUE these medications which have CHANGED   Details  furosemide (LASIX) 80 MG tablet Take 0.5 tablets (40 mg total) by mouth daily. Qty: 30 tablet, Refills: 0      CONTINUE these medications which have NOT CHANGED   Details  acetaminophen (TYLENOL) 325 MG  tablet Take 2 tablets (650 mg total) by mouth every 6 (six) hours as needed for mild pain (or Fever >/= 101).    albuterol (PROVENTIL) (2.5 MG/3ML) 0.083% nebulizer solution Take 2.5 mg by nebulization every 6 (six) hours as needed for wheezing or shortness of breath.    amLODipine-atorvastatin (CADUET) 5-10 MG per tablet Take 1 tablet by mouth at bedtime.    aspirin EC 81 MG tablet Take 81 mg by mouth  daily.    atenolol (TENORMIN) 50 MG tablet Take 50 mg by mouth daily.    budesonide-formoterol (SYMBICORT) 160-4.5 MCG/ACT inhaler Inhale 2 puffs into the lungs 2 (two) times daily.    calcium carbonate (TUMS - DOSED IN MG ELEMENTAL CALCIUM) 500 MG chewable tablet Chew 1 tablet by mouth 2 (two) times daily.    CALCIUM-VITAMIN D PO Take 1 tablet by mouth 2 (two) times daily. osteoporosis    cetirizine (ZYRTEC) 10 MG tablet Take 5 mg by mouth daily.     clotrimazole (LOTRIMIN) 1 % cream Apply 1 application topically 2 (two) times daily. Between toes    docusate sodium (COLACE) 100 MG capsule Take 100 mg by mouth daily.    dorzolamide-timolol (COSOPT) 22.3-6.8 MG/ML ophthalmic solution Place 1 drop into both eyes 2 (two) times daily.    enalapril (VASOTEC) 20 MG tablet Take 20 mg by mouth daily.     ferrous sulfate 325 (65 FE) MG tablet Take 325 mg by mouth at bedtime.    fluticasone (FLONASE) 50 MCG/ACT nasal spray Place 2 sprays into both nostrils daily.    glucose 4 GM chewable tablet Chew 1 tablet by mouth as needed for low blood sugar.    hydrocortisone (ANUSOL-HC) 2.5 % rectal cream Place 1 application rectally 3 (three) times daily as needed for hemorrhoids or itching.    hydrocortisone cream 1 % Apply 1 application topically 3 (three) times daily as needed for itching.    insulin NPH-regular Human (NOVOLIN 70/30) (70-30) 100 UNIT/ML injection Inject 34 Units into the skin 2 (two) times daily with a meal. 34 units in the am. 15 units in the pm.    latanoprost (XALATAN) 0.005 % ophthalmic solution Place 1 drop into both eyes at bedtime.    levothyroxine (SYNTHROID, LEVOTHROID) 50 MCG tablet Take 50 mcg by mouth daily.    loperamide (IMODIUM) 2 MG capsule Take 2 mg by mouth as needed for diarrhea or loose stools.    magnesium hydroxide (MILK OF MAGNESIA) 400 MG/5ML suspension Take 15 mLs by mouth 4 (four) times daily.    metoCLOPramide (REGLAN) 5 MG tablet Take 5 mg by mouth  3 (three) times daily before meals.    psyllium (HYDROCIL/METAMUCIL) 95 % PACK Take 1 packet by mouth 2 (two) times daily.    ranitidine (ZANTAC) 150 MG tablet Take 150 mg by mouth 2 (two) times daily.    sertraline (ZOLOFT) 100 MG tablet Take 150 mg by mouth daily.    spironolactone (ALDACTONE) 25 MG tablet Take 50 mg by mouth daily. For fluid    traMADol (ULTRAM) 50 MG tablet Take 25 mg by mouth every 6 (six) hours as needed.    traZODone (DESYREL) 100 MG tablet Take 100 mg by mouth at bedtime.    trolamine salicylate (ASPERCREME) 10 % cream Apply 1 application topically as needed for muscle pain.    Vitamins A & D (VITAMIN A & D) ointment Apply 1 application topically every 12 (twelve) hours.  Today   CHIEF COMPLAINT:   Doing well this morning. Patient was complaining of ear pain. No tinnitus or discharge.   VITAL SIGNS:  Blood pressure (!) 136/49, pulse 78, temperature 97.8 F (36.6 C), temperature source Oral, resp. rate 18, height  (1.549 m), weight 83.9 kg (185 lb), SpO2 93 %.   REVIEW OF SYSTEMS:  Review of Systems  Constitutional: Negative.  Negative for chills, fever and malaise/fatigue.  HENT: Positive for ear pain. Negative for ear discharge, hearing loss, nosebleeds and sore throat.   Eyes: Negative.  Negative for blurred vision and pain.  Respiratory: Negative.  Negative for cough, hemoptysis, shortness of breath and wheezing.   Cardiovascular: Negative.  Negative for chest pain, palpitations and leg swelling.  Gastrointestinal: Negative.  Negative for abdominal pain, blood in stool, diarrhea, nausea and vomiting.  Genitourinary: Negative.  Negative for dysuria.  Musculoskeletal: Negative.  Negative for back pain.  Skin: Negative.   Neurological: Negative for dizziness, tremors, speech change, focal weakness, seizures and headaches.  Endo/Heme/Allergies: Negative.  Does not bruise/bleed easily.  Psychiatric/Behavioral: Negative.   Negative for depression, hallucinations and suicidal ideas.     PHYSICAL EXAMINATION:  GENERAL:  75 y.o.-year-old patient lying in the bed with no acute distress.  NECK:  Supple, no jugular venous distention. No thyroid enlargement, no tenderness.  EAR good Light reflex. Tympanic membrane intact. No evidence of Titus media. LUNGS: Normal breath sounds bilaterally, no wheezing, rales,rhonchi  No use of accessory muscles of respiration.  CARDIOVASCULAR: S1, S2 normal. No murmurs, rubs, or gallops.  ABDOMEN: Soft, non-tender, non-distended. Bowel sounds present. No organomegaly or mass.  EXTREMITIES: No pedal edema, cyanosis, or clubbing.  PSYCHIATRIC: The patient is alert and oriented x 3.  SKIN: No obvious rash, lesion, or ulcer.   DATA REVIEW:   CBC  Recent Labs Lab 11/27/15 0506  WBC 14.6*  HGB 10.8*  HCT 31.3*  PLT 96*    Chemistries   Recent Labs Lab 11/25/15 0815 11/26/15 0502 11/27/15 0506  NA 138 139 140  K 3.1* 4.3 4.5  CL 99* 106 111  CO2 GLUCOSE 154* 155* 140*  BUN 25* 34* 27*  CREATININE 1.28* 1.44* 1.04*  CALCIUM 9.1 8.0* 8.3*  MG  --  1.4*  --   AST 29  --   --   ALT 18  --   --   ALKPHOS 142*  --   --   BILITOT 1.0  --   --     Cardiac Enzymes  Recent Labs Lab 11/25/15 0815  TROPONINI 0.06*    Microbiology Results  @  RADIOLOGY:  No results found.    Management plans discussed with the patient and she is in agreement. Stable for discharge home with [facility], she did not want to be discharges [facility] facility which is what physical therapy recommended.  Patient should follow up with PCP 1 week  CODE STATUS:     Code Status Orders        Start     Ordered   11/25/15 1201  Do not attempt resuscitation (DNR)  Continuous    Question Answer Comment  In the event of cardiac or respiratory ARREST Do not call a "code blue"   In the event of cardiac or respiratory ARREST Do not perform Intubation, CPR,  defibrillation or ACLS   In the event of cardiac or respiratory ARREST Use medication by any route, position, wound care, and other measures to relive pain  and suffering. May use oxygen, suction and manual treatment of airway obstruction as needed for comfort.      11/25/15 1200    Code Status History    Date Active Date Inactive Code Status Order ID Comments User Context   09/24/2014  5:20 PM 09/28/2014  7:15 PM Full Code [id number]  Milagros Loll, MD ED      TOTAL TIME TAKING CARE OF THIS PATIENT: 38  minutes.    Note: This dictation was prepared with Dragon dictation along with smaller phrase technology. Any transcriptional errors that result from this process are unintentional.  Emin Foree M.D on 11/27/2015 at 1:54 PM  Between 7am to 6pm - Pager - [phone] After 6pm go to [email or web address] - Social research officer, government  [facility]  Office  [phone]  CC: Primary care physician; [facility]

## 2015-11-27 NOTE — Progress Notes (Signed)
PT Cancellation Note  Patient Details Name: SANTITA RIPPEON MRN: [id number] DOB: 1940-05-13   Cancelled Treatment:    Reason Eval/Treat Not Completed: Other (comment) Treatment attempted, pt currently with dietitian with multiple family members in the room. Will re-attempt at later time.   Dewey Viens 11/27/2015, 10:23 AM  Cassell Smiles, SPT [phone]

## 2015-11-27 NOTE — Progress Notes (Signed)
Pharmacy Antibiotic Note  Laurie Ross is a 75 y.o. female admitted on 11/25/2015 with sepsis and UTI.  Patient is currently ordered Meropenem 2g IV q8h awaiting sensitivity results of E.coli.  Plan: Culture results were discussed with Dr. Juliene Pina. One set of blood cultures is growing E.coli in both aerobic and anaerobic bottles. Urine culture is growing E.coli that is pan sensitive. Will discontinue Meropenem and transition patient to Cefuroxime 500mg  PO BID.  Height: 5\' 1"  (154.9 cm) Weight: 185 lb (83.9 kg) IBW/kg (Calculated) : 47.8  Temp (24hrs), Avg:98 F (36.7 C), Min:97.8 F (36.6 C), Max:98.1 F (36.7 C)   Recent Labs Lab 11/25/15 0815 11/25/15 1206 11/26/15 0502 11/27/15 0506  WBC 11.8*  --  17.6* 14.6*  CREATININE 1.28*  --  1.44* 1.04*  LATICACIDVEN 2.5* 1.9  --   --     Estimated Creatinine Clearance: 45.2 mL/min (by C-G formula based on SCr of 1.04 mg/dL).    Allergies  Allergen Reactions  . Codeine   . Glucophage [Metformin Hcl]   . Loratadine   . Penicillins Itching    Has patient had a PCN reaction causing immediate rash, facial/tongue/throat swelling, SOB or lightheadedness with hypotension: No Has patient had a PCN reaction causing severe rash involving mucus membranes or skin necrosis: No Has patient had a PCN reaction that required hospitalization No Has patient had a PCN reaction occurring within the last 10 years: No If all of the above answers are "NO", then may proceed with Cephalosporin use.     Microbiology results: 8/2 BCx: E.coli in one set 8/2 UCx: E.coli (pan sensitive)   Thank you for allowing pharmacy to be a part of this patient's care.  Clovia Cuff, PharmD, BCPS 11/27/2015 1:43 PM

## 2015-11-27 NOTE — Progress Notes (Addendum)
Initial Nutrition Assessment     INTERVENTION:  -Pt would benefit from liberalizing diet to regular if unable to meet nutritional goals -Pt reports can't tolerate oral nutrition supplements as causing diarrhea -Encouraged high protein, well balanced diet     NUTRITION DIAGNOSIS:   Inadequate oral intake related to poor appetite as evidenced by percent weight loss.    GOAL:   Patient will meet greater than or equal to 90% of their needs    MONITOR:   PO intake, Weight trends  REASON FOR ASSESSMENT:   Malnutrition Screening Tool    ASSESSMENT:      Pt admitted with AMS, weakness, UTI, septic and bactermia  Past Medical History:  Diagnosis Date  . CHF (congestive heart failure) (HCC)   . COPD (chronic obstructive pulmonary disease) (HCC)    occasional home O2 use  . Diabetes mellitus without complication (HCC)   . GERD (gastroesophageal reflux disease)   . Hx of Clostridium difficile infection March 2016  . Hypertension   . Obstructive sleep apnea    Pt reports appetite has been decreased for a long time (ie weeks, months). Unable to get clear picture of intake from pt or family. Reports that she took few bites of breakfast this am and nothing for dinner last night. Family reports pt is picky eater  Medications reviewed: colace, fe sulfate, aspart, lantus, reglan, NS at 135ml/hr Labs reviewed: BUN 27, creatinine 1.04, calcium 8.3, glucose 140, WBC 14.6  Nutrition-Focused physical exam completed. Findings are WDL for fat depletion, muscle depletion, and edema.   Diet Order:  Diet Heart Room service appropriate? Yes; Fluid consistency: Thin  Skin:  Reviewed, no issues  Last BM:  8/4  Height:   Ht Readings from Last 1 Encounters:  11/25/15 5\' 1"  (1.549 m)    Weight: Noted wt loss of 9% in the last 2 months per wt encounters   Wt Readings from Last 1 Encounters:  11/25/15 185 lb (83.9 kg)    Ideal Body Weight:     BMI:  Body mass index is 34.96  kg/m.  Estimated Nutritional Needs:   Kcal:  9628-3662 kcals/d  Protein:  100-126 g/d  Fluid:  >/= 1267 ml/d  EDUCATION NEEDS:   No education needs identified at this time  Emric Kowalewski B. Freida Busman, RD, LDN [phone] (pager) Weekend/On-Call pager [phone])

## 2015-11-27 NOTE — Progress Notes (Signed)
CSW contacted [facility] to inform them that patient is discharging today. Received working phone number for Rosey Bath [phone]. CSW left voicemail. Will continue to follow and assist.  Woodroe Mode, MSW, LCSW, LCAS-A Clinical Social Worker [phone]

## 2015-11-27 NOTE — Progress Notes (Signed)
CSW contacted [place] [facility] to find patient's PASRR. Patient's PASRR # is listed under the a different SSN. CSW will confirm patient's SSN during assessment to determine accuracy.   Woodroe Mode, MSW, LCSW, LCAS-A Clinical Social Worker [phone]

## 2015-11-27 NOTE — NC FL2 (Signed)
[place] MEDICAID FL2 LEVEL OF CARE SCREENING TOOL     IDENTIFICATION  Patient Name: Laurie Ross Birthdate: [birth year] Sex: female Admission Date (Current Location): 11/25/2015  [place] and [place] Number:  Chiropodist and Address:  [facility], [address], [place], [place] [address]      Provider Number: [id number]  Attending Physician Name and Address:  Adrian Saran, MD  Relative Name and Phone Number:       Current Level of Care: Hospital Recommended Level of Care: Skilled Nursing Facility Prior Approval Number:    Date Approved/Denied:   PASRR Number:  [id number])  Discharge Plan: SNF    Current Diagnoses: Patient Active Problem List   Diagnosis Date Noted  . Sepsis (HCC) 11/25/2015  . Diarrhea 09/24/2014    Orientation RESPIRATION BLADDER Height & Weight     Self, Time, Situation, Place  Normal Continent Weight: 185 lb (83.9 kg) Height:  5\' 1"  (154.9 cm)  BEHAVIORAL SYMPTOMS/MOOD NEUROLOGICAL BOWEL NUTRITION STATUS   (None)  (NOne) Incontinent Diet (Heart )  AMBULATORY STATUS COMMUNICATION OF NEEDS Skin   Extensive Assist Verbally Normal                       Personal Care Assistance Level of Assistance  Bathing, Feeding, Dressing Bathing Assistance: Limited assistance Feeding assistance: Independent Dressing Assistance: Limited assistance     Functional Limitations Info  Sight, Hearing, Speech Sight Info: Adequate Hearing Info: Adequate Speech Info: Adequate    SPECIAL CARE FACTORS FREQUENCY  PT (By licensed PT), OT (By licensed OT)     PT Frequency:  (5) OT Frequency:  (5)            Contractures      Additional Factors Info  Code Status, Allergies, Insulin Sliding Scale Code Status Info:  (DNR) Allergies Info:  (Codeine, Glucophage Metformin Hcl, Loratadine, Penicillins)   Insulin Sliding Scale Info: insulin glargine (LANTUS) injection 15 Units 15 Units, Subcutaneous, Daily  ;  insulin aspart (novoLOG) injection 3 Units 3 Units, Subcutaneous, 3 times daily with meals; insulin aspart (novoLOG) injection 0-15 Units 0-15 Units, Subcutaneous, 3 times daily with meals; insulin aspart (novoLOG) injection 0-5 Units 0-5 Units, Subcutaneous, Daily at bedtime       Current Medications (11/27/2015):  This is the current hospital active medication list Current Facility-Administered Medications  Medication Dose Route Frequency Provider Last Rate Last Dose  . 0.9 %  sodium chloride infusion   Intravenous Continuous Houston Siren, MD 100 mL/hr at 11/26/15 2333    . acetaminophen (TYLENOL) tablet 650 mg  650 mg Oral Q6H PRN Houston Siren, MD   650 mg at 11/26/15 2156   Or  . acetaminophen (TYLENOL) suppository 650 mg  650 mg Rectal Q6H PRN Houston Siren, MD      . albuterol (PROVENTIL) (2.5 MG/3ML) 0.083% nebulizer solution 2.5 mg  2.5 mg Nebulization Q6H PRN Houston Siren, MD      . aspirin EC tablet 81 mg  81 mg Oral Daily Houston Siren, MD   81 mg at 11/27/15 8676  . calcium carbonate (TUMS - dosed in mg elemental calcium) chewable tablet 200 mg of elemental calcium  1 tablet Oral BID Houston Siren, MD   200 mg of elemental calcium at 11/27/15 0821  . docusate sodium (COLACE) capsule 100 mg  100 mg Oral Daily Houston Siren, MD   100 mg at 11/26/15 7209  .  dorzolamide-timolol (COSOPT) 22.3-6.8 MG/ML ophthalmic solution 1 drop  1 drop Both Eyes BID Houston Siren, MD   1 drop at 11/27/15 0821  . enoxaparin (LOVENOX) injection 40 mg  40 mg Subcutaneous Q24H Houston Siren, MD   40 mg at 11/26/15 2147  . ferrous sulfate tablet 325 mg  325 mg Oral QHS Houston Siren, MD   325 mg at 11/26/15 2147  . fluticasone (FLONASE) 50 MCG/ACT nasal spray 2 spray  2 spray Each Nare Daily Houston Siren, MD   2 spray at 11/27/15 0820  . hydrocortisone (ANUSOL-HC) 2.5 % rectal cream 1 application  1 application Rectal TID PRN Houston Siren, MD      . insulin aspart (novoLOG) injection  0-15 Units  0-15 Units Subcutaneous TID WC Wyatt Haste, MD   2 Units at 11/27/15 [phone]  . insulin aspart (novoLOG) injection 0-5 Units  0-5 Units Subcutaneous QHS Wyatt Haste, MD   3 Units at 11/25/15 2136  . insulin aspart (novoLOG) injection 3 Units  3 Units Subcutaneous TID WC Adrian Saran, MD   3 Units at 11/27/15 0820  . insulin glargine (LANTUS) injection 15 Units  15 Units Subcutaneous Daily Sital Mody, MD      . latanoprost (XALATAN) 0.005 % ophthalmic solution 1 drop  1 drop Both Eyes QHS Houston Siren, MD   1 drop at 11/26/15 2200  . levothyroxine (SYNTHROID, LEVOTHROID) tablet 50 mcg  50 mcg Oral Daily Houston Siren, MD   50 mcg at 11/27/15 9811  . loratadine (CLARITIN) tablet 10 mg  10 mg Oral Daily Houston Siren, MD   10 mg at 11/27/15 9147  . meropenem (MERREM) 2 g in sodium chloride 0.9 % 100 mL IVPB  2 g Intravenous Q8H Oralia Manis, MD   2 g at 11/27/15 0815  . metoCLOPramide (REGLAN) tablet 5 mg  5 mg Oral TID AC Houston Siren, MD   5 mg at 11/27/15 8295  . mometasone-formoterol (DULERA) 200-5 MCG/ACT inhaler 2 puff  2 puff Inhalation BID Houston Siren, MD   2 puff at 11/27/15 0820  . ondansetron (ZOFRAN) tablet 4 mg  4 mg Oral Q6H PRN Houston Siren, MD       Or  . ondansetron (ZOFRAN) injection 4 mg  4 mg Intravenous Q6H PRN Houston Siren, MD      . potassium chloride SA (K-DUR,KLOR-CON) CR tablet 20 mEq  20 mEq Oral BID Houston Siren, MD   20 mEq at 11/27/15 6213  . sertraline (ZOLOFT) tablet 150 mg  150 mg Oral Daily Houston Siren, MD   150 mg at 11/27/15 0865  . traZODone (DESYREL) tablet 100 mg  100 mg Oral QHS Houston Siren, MD   100 mg at 11/25/15 2135  . vitamin A & D ointment 1 application  1 application Topical Q12H Houston Siren, MD         Discharge Medications: Please see discharge summary for a list of discharge medications.  Relevant Imaging Results:  Relevant Lab Results:   Additional Information  (SSN [id number])  Verta Ellen  Capone Schwinn, LCSW

## 2015-11-27 NOTE — Clinical Social Work Note (Signed)
Clinical Social Work Assessment  Patient Details  Name: Laurie Ross MRN: [id number] Date of Birth: 1941-02-18  Date of referral:  11/27/15               Reason for consult:  Discharge Planning                Permission sought to share information with:  Family Supports Permission granted to share information::  Yes, Verbal Permission Granted  Name::        Agency::     Relationship::   ([place]aughters )  Contact Information:     Housing/Transportation Living arrangements for the past 2 months:  Single Family Home Source of Information:  Patient Patient Interpreter Needed:  None Criminal Activity/Legal Involvement Pertinent to Current Situation/Hospitalization:  No - Comment as needed Significant Relationships:  Adult Children, Other Family Members, Friend Lives with:  Self Do you feel safe going back to the place where you live?  Yes Need for family participation in patient care:  Yes (Comment) ([place]aughters )  Care giving concerns:  PT recommends that patient would benefit from SNF services. Patient is also a [facility] patient.    Social Worker assessment / plan:  CSW met with patient at bedside. Introduced herself and her role. CSW informed patient that PT recommended that she'd benefit from SNF placement. Patient reports that she did not want to go to a SNF. Stated that she goes to [facility] days a week and is willing to go for 5 to receive PT. Stated she does not want to leave home to go to a SNF. CSW informed patient that she'd discharge today. Patient is in agreement. Granted CSW verbal permission to speak to her CSW at [facility] Jenny Reichmann) to coordinate her going to [facility] 5 days a week. Also, granted CSW verbal permission to speak to her daughters to arrange transport home. CSW attempted to contact patient's daughters. Helene Kelp # does not work. CSW left voicemail for [name]. Awaiting phone call back. Per MD Mody patient is medically stable to discharge today. CSW will  continue to follow and assist.   Employment status:  Retired Insurance underwriter information:  Other (Comment Required) ([facility]) PT Recommendations:  [place] / Referral to community resources:  [place]  Patient/Family's Response to care:  Patient is in agreement to return home and go to [facility] 5 days a week for PT.   Patient/Family's Understanding of and Emotional Response to Diagnosis, Current Treatment, and Prognosis:  Patient reports she understands her  Diagnosis, Current Treatment, and Prognosis. Reported that she appreciate's CSW's assistance and feels that she will do well at home.   Emotional Assessment Appearance:  Appears stated age Attitude/Demeanor/Rapport:   (None) Affect (typically observed):  Accepting, Calm, Pleasant Orientation:  Oriented to Self, Oriented to Place, Oriented to  Time, Oriented to Situation Alcohol / Substance use:  Not Applicable Psych involvement (Current and /or in the community):  No (Comment)  Discharge Needs  Concerns to be addressed:  Discharge Planning Concerns Readmission within the last 30 days:  No Current discharge risk:  None Barriers to Discharge:  No Barriers Identified   [place], LCSW 11/27/2015, 2:32 PM

## 2015-11-27 NOTE — Progress Notes (Signed)
Physical Therapy Treatment Patient Details Name: Laurie Ross MRN: [id number] DOB: 03/28/1941 Today's Date: 11/27/2015    History of Present Illness Pt was admitted with weakness, malaise, SOB, and general weakness; she is currently being treated for sepsis due to UTI, and bacteremia. Her medical HX includes CHF, DM, COPD, GERD, and HTN    PT Comments    Pt is progressing towards goals with increase endurance and activity tolerance. She was able to ambulate 25' with min assist and RW; standing balance is still impaired and pt is a high fall risk due to impaired balance, strength, and endurance; she is no longer buckling. Pt was cooperative with ther-ex. Pt is appropriate for skilled PT at this time to address deficits.   Follow Up Recommendations  SNF     Equipment Recommendations  None recommended by PT    Recommendations for Other Services       Precautions / Restrictions Precautions Precautions: Fall Restrictions Weight Bearing Restrictions: No    Mobility  Bed Mobility Overal bed mobility: Needs Assistance Bed Mobility: Supine to Sit     Supine to sit: Min assist     General bed mobility comments: Required assist to get LE off EOB and trunk support with HOB elevated, improved from last visit  Transfers Overall transfer level: Needs assistance Equipment used: Rolling walker (2 wheeled) Transfers: Sit to/from Stand Sit to Stand: Min assist         General transfer comment: Pt needed verbal cues for hand placement   Ambulation/Gait Ambulation/Gait assistance: Min assist Ambulation Distance (Feet): 25 Feet Assistive device: Rolling walker (2 wheeled) Gait Pattern/deviations: Step-through pattern;Decreased stride length;Shuffle   Gait velocity interpretation: <1.8 ft/sec, indicative of risk for recurrent falls General Gait Details: Pt's ambulation is improved, but still limited by fatigue/general weakness. She is still a fall risk w/RW   Social research officer, government Rank[employer] (Stroke Patients Only)       Balance Overall balance assessment: Needs assistance Sitting-balance support: No upper extremity supported Sitting balance-Leahy Scale: Good Sitting balance - Comments: tolerated moderate perturbation  with no UE support   Standing balance support: Bilateral upper extremity supported Standing balance-Leahy Scale: Poor Standing balance comment: impaired due to strength and coordination deficits                    Cognition Arousal/Alertness: Awake/alert Behavior During Therapy: WFL for tasks assessed/performed Overall Cognitive Status: Within Functional Limits for tasks assessed                      Exercises General Exercises - Lower Extremity Ankle Circles/Pumps: 15 reps;Both Long Arc Quad: 15 reps;Both Hip Flexion/Marching: 15 reps;Both    General Comments        Pertinent Vitals/Pain Pain Assessment: No/denies pain    Home Living                      Prior Function            PT Goals (current goals can now be found in the care plan section) Acute Rehab PT Goals Patient Stated Goal: go home PT Goal Formulation: With patient Time For Goal Achievement: 12/10/15 Potential to Achieve Goals: Good Progress towards PT goals: Progressing toward goals    Frequency  Min 2X/week    PT Plan Current plan remains appropriate    Co-evaluation  End of Session Equipment Utilized During Treatment: Gait belt Activity Tolerance: Patient limited by fatigue Patient left: in chair;with call bell/phone within reach;with chair alarm set     Time: 9622-2979 PT Time Calculation (min) (ACUTE ONLY): 25 min  Charges:                       G Codes:      Myley Bahner 12/08/15, 4:35 PM  Cassell Smiles, SPT [phone]

## 2015-11-27 NOTE — Progress Notes (Signed)
Tried  to called daughter at phone in contacts to let her know patient is coming home other said wrong number.  Per patient her daughter next door will let her in .

## 2015-11-29 LAB — CULTURE, BLOOD (ROUTINE X 2)

## 2015-11-30 LAB — CULTURE, BLOOD (ROUTINE X 2): CULTURE: NO GROWTH

## 2016-03-15 NOTE — Discharge Instructions (Signed)
Cataract Surgery, Care After °Refer to this sheet in the next few weeks. These instructions provide you with information about caring for yourself after your procedure. Your health care provider may also give you more specific instructions. Your treatment has been planned according to current medical practices, but problems sometimes occur. Call your health care provider if you have any problems or questions after your procedure. °What can I expect after the procedure? °After the procedure, it is common to have: °· Itching. °· Discomfort. °· Fluid discharge. °· Sensitivity to light and to touch. °· Bruising. °Follow these instructions at home: °Eye Care  °· Check your eye every day for signs of infection. Watch for: °¨ Redness, swelling, or pain. °¨ Fluid, blood, or pus. °¨ Warmth. °¨ Bad smell. °Activity  °· Avoid strenuous activities, such as playing contact sports, for as long as told by your health care provider. °· Do not drive or operate heavy machinery until your health care provider approves. °· Do not bend or lift heavy objects . Bending increases pressure in the eye. You can walk, climb stairs, and do light household chores. °· Ask your health care provider when you can return to work. If you work in a dusty environment, you may be advised to wear protective eyewear for a period of time. °General instructions  °· Take or apply over-the-counter and prescription medicines only as told by your health care provider. This includes eye drops. °· Do not touch or rub your eyes. °· If you were given a protective shield, wear it as told by your health care provider. If you were not given a protective shield, wear sunglasses as told by your health care provider to protect your eyes. °· Keep the area around your eye clean and dry. Avoid swimming or allowing water to hit you directly in the face while showering until told by your health care provider. Keep soap and shampoo out of your eyes. °· Do not put a contact lens  into the affected eye or eyes until your health care provider approves. °· Keep all follow-up visits as told by your health care provider. This is important. °Contact a health care provider if: ° °· You have increased bruising around your eye. °· You have pain that is not helped with medicine. °· You have a fever. °· You have redness, swelling, or pain in your eye. °· You have fluid, blood, or pus coming from your incision. °· Your vision gets worse. °Get help right away if: °· You have sudden vision loss. °This information is not intended to replace advice given to you by your health care provider. Make sure you discuss any questions you have with your health care provider. °Document Released: 10/29/2004 Document Revised: 08/20/2015 Document Reviewed: 02/19/2015 °Elsevier Interactive Patient Education © 2017 [employer] ° ° ° ° °General Anesthesia, Adult, Care After °These instructions provide you with information about caring for yourself after your procedure. Your health care provider may also give you more specific instructions. Your treatment has been planned according to current medical practices, but problems sometimes occur. Call your health care provider if you have any problems or questions after your procedure. °What can I expect after the procedure? °After the procedure, it is common to have: °· Vomiting. °· A sore throat. °· Mental slowness. °It is common to feel: °· Nauseous. °· Cold or shivery. °· Sleepy. °· Tired. °· Sore or achy, even in parts of your body where you did not have surgery. °Follow these instructions at   home: °For at least 24 hours after the procedure:  °· Do not: °¨ Participate in activities where you could fall or become injured. °¨ Drive. °¨ Use heavy machinery. °¨ Drink alcohol. °¨ Take sleeping pills or medicines that cause drowsiness. °¨ Make important decisions or sign legal documents. °¨ Take care of children on your own. °· Rest. °Eating and drinking  °· If you vomit, drink  water, juice, or soup when you can drink without vomiting. °· Drink enough fluid to keep your urine clear or pale yellow. °· Make sure you have little or no nausea before eating solid foods. °· Follow the diet recommended by your health care provider. °General instructions  °· Have a responsible adult stay with you until you are awake and alert. °· Return to your normal activities as told by your health care provider. Ask your health care provider what activities are safe for you. °· Take over-the-counter and prescription medicines only as told by your health care provider. °· If you smoke, do not smoke without supervision. °· Keep all follow-up visits as told by your health care provider. This is important. °Contact a health care provider if: °· You continue to have nausea or vomiting at home, and medicines are not helpful. °· You cannot drink fluids or start eating again. °· You cannot urinate after 8-12 hours. °· You develop a skin rash. °· You have fever. °· You have increasing redness at the site of your procedure. °Get help right away if: °· You have difficulty breathing. °· You have chest pain. °· You have unexpected bleeding. °· You feel that you are having a life-threatening or urgent problem. °This information is not intended to replace advice given to you by your health care provider. Make sure you discuss any questions you have with your health care provider. °Document Released: 07/18/2000 Document Revised: 09/14/2015 Document Reviewed: 03/26/2015 °Elsevier Interactive Patient Education © 2017 Elsevier Inc. ° °

## 2016-03-16 ENCOUNTER — Encounter: Payer: Self-pay | Admitting: *Deleted

## 2016-03-23 ENCOUNTER — Ambulatory Visit: Payer: Medicare (Managed Care) | Admitting: Anesthesiology

## 2016-03-23 ENCOUNTER — Encounter
Admission: RE | Disposition: A | Discharge status: Home / Self Care | Payer: Self-pay | Source: Ambulatory Visit | Attending: Ophthalmology

## 2016-03-23 ENCOUNTER — Ambulatory Visit
Admission: RE | Admit: 2016-03-23 | Discharge: 2016-03-23 | Disposition: A | Discharge status: Home / Self Care | Payer: Medicare (Managed Care) | Source: Ambulatory Visit | Attending: Ophthalmology | Admitting: Ophthalmology

## 2016-03-23 DIAGNOSIS — H5703 Miosis: Secondary | ICD-10-CM | POA: Diagnosis not present

## 2016-03-23 DIAGNOSIS — I110 Hypertensive heart disease with heart failure: Secondary | ICD-10-CM | POA: Insufficient documentation

## 2016-03-23 DIAGNOSIS — Z7951 Long term (current) use of inhaled steroids: Secondary | ICD-10-CM | POA: Diagnosis not present

## 2016-03-23 DIAGNOSIS — I509 Heart failure, unspecified: Secondary | ICD-10-CM | POA: Diagnosis not present

## 2016-03-23 DIAGNOSIS — K219 Gastro-esophageal reflux disease without esophagitis: Secondary | ICD-10-CM | POA: Diagnosis not present

## 2016-03-23 DIAGNOSIS — I252 Old myocardial infarction: Secondary | ICD-10-CM | POA: Insufficient documentation

## 2016-03-23 DIAGNOSIS — E7800 Pure hypercholesterolemia, unspecified: Secondary | ICD-10-CM | POA: Insufficient documentation

## 2016-03-23 DIAGNOSIS — E1140 Type 2 diabetes mellitus with diabetic neuropathy, unspecified: Secondary | ICD-10-CM | POA: Diagnosis not present

## 2016-03-23 DIAGNOSIS — Z794 Long term (current) use of insulin: Secondary | ICD-10-CM | POA: Diagnosis not present

## 2016-03-23 DIAGNOSIS — Z79899 Other long term (current) drug therapy: Secondary | ICD-10-CM | POA: Diagnosis not present

## 2016-03-23 DIAGNOSIS — Z7982 Long term (current) use of aspirin: Secondary | ICD-10-CM | POA: Diagnosis not present

## 2016-03-23 DIAGNOSIS — D649 Anemia, unspecified: Secondary | ICD-10-CM | POA: Insufficient documentation

## 2016-03-23 DIAGNOSIS — F329 Major depressive disorder, single episode, unspecified: Secondary | ICD-10-CM | POA: Diagnosis not present

## 2016-03-23 DIAGNOSIS — H2511 Age-related nuclear cataract, right eye: Secondary | ICD-10-CM | POA: Insufficient documentation

## 2016-03-23 DIAGNOSIS — J449 Chronic obstructive pulmonary disease, unspecified: Secondary | ICD-10-CM | POA: Insufficient documentation

## 2016-03-23 HISTORY — DX: Hypothyroidism, unspecified: E03.9

## 2016-03-23 HISTORY — PX: CATARACT EXTRACTION W/PHACO: SHX586

## 2016-03-23 HISTORY — DX: Sciatica, unspecified side: M54.30

## 2016-03-23 HISTORY — DX: Venous insufficiency (chronic) (peripheral): I87.2

## 2016-03-23 HISTORY — DX: Depression, unspecified: F32.A

## 2016-03-23 HISTORY — DX: Anemia, unspecified: D64.9

## 2016-03-23 HISTORY — DX: Unspecified asthma, uncomplicated: J45.909

## 2016-03-23 HISTORY — DX: Unspecified osteoarthritis, unspecified site: M19.90

## 2016-03-23 HISTORY — DX: Hyperlipidemia, unspecified: E78.5

## 2016-03-23 HISTORY — DX: Unspecified glaucoma: H40.9

## 2016-03-23 HISTORY — DX: Cardiac arrhythmia, unspecified: I49.9

## 2016-03-23 HISTORY — DX: Major depressive disorder, single episode, unspecified: F32.9

## 2016-03-23 HISTORY — DX: Chronic kidney disease, unspecified: N18.9

## 2016-03-23 LAB — GLUCOSE, CAPILLARY
GLUCOSE-CAPILLARY: 100 mg/dL — AB (ref 65–99)
GLUCOSE-CAPILLARY: 130 mg/dL — AB (ref 65–99)

## 2016-03-23 SURGERY — PHACOEMULSIFICATION, CATARACT, WITH IOL INSERTION
Anesthesia: Monitor Anesthesia Care | Laterality: Right | Wound class: Clean

## 2016-03-23 MED ORDER — EPINEPHRINE PF 1 MG/ML IJ SOLN
INTRAOCULAR | Status: DC | PRN
  Administered 2016-03-23: 70 mL via OPHTHALMIC

## 2016-03-23 MED ORDER — MOXIFLOXACIN HCL 0.5 % OP SOLN
OPHTHALMIC | Status: DC | PRN
  Administered 2016-03-23: .2 mL via OPHTHALMIC

## 2016-03-23 MED ORDER — BRIMONIDINE TARTRATE 0.2 % OP SOLN
OPHTHALMIC | Status: DC | PRN
  Administered 2016-03-23: 1 [drp] via OPHTHALMIC

## 2016-03-23 MED ORDER — TIMOLOL MALEATE 0.5 % OP SOLN
OPHTHALMIC | Status: DC | PRN
  Administered 2016-03-23: 1 [drp] via OPHTHALMIC

## 2016-03-23 MED ORDER — FENTANYL CITRATE (PF) 100 MCG/2ML IJ SOLN
INTRAMUSCULAR | Status: DC | PRN
  Administered 2016-03-23: 50 ug via INTRAVENOUS

## 2016-03-23 MED ORDER — NA HYALUR & NA CHOND-NA HYALUR 0.4-0.35 ML IO KIT
PACK | INTRAOCULAR | Status: DC | PRN
  Administered 2016-03-23: 1 mL via INTRAOCULAR

## 2016-03-23 MED ORDER — LIDOCAINE HCL (PF) 4 % IJ SOLN
INTRAOCULAR | Status: DC | PRN
  Administered 2016-03-23: 1 mL via OPHTHALMIC

## 2016-03-23 MED ORDER — MIDAZOLAM HCL 2 MG/2ML IJ SOLN
INTRAMUSCULAR | Status: DC | PRN
Start: 2016-03-23 — End: 2016-03-23
  Administered 2016-03-23: 1.5 mg via INTRAVENOUS

## 2016-03-23 MED ORDER — MOXIFLOXACIN HCL 0.5 % OP SOLN
1.0000 [drp] | OPHTHALMIC | Status: DC | PRN
  Administered 2016-03-23 (×3): 1 [drp] via OPHTHALMIC

## 2016-03-23 MED ORDER — ARMC OPHTHALMIC DILATING DROPS
1.0000 "application " | OPHTHALMIC | Status: DC | PRN
  Administered 2016-03-23 (×3): 1 via OPHTHALMIC

## 2016-03-23 SURGICAL SUPPLY — 26 items
CANNULA ANT/CHMB 27GA (MISCELLANEOUS) ×3 IMPLANT
CARTRIDGE ABBOTT (MISCELLANEOUS) IMPLANT
GLOVE SURG LX 7.5 STRW (GLOVE) ×2
GLOVE SURG LX STRL 7.5 STRW (GLOVE) ×1 IMPLANT
GLOVE SURG TRIUMPH 8.0 PF LTX (GLOVE) ×3 IMPLANT
GOWN STRL REUS W/ TWL LRG LVL3 (GOWN DISPOSABLE) ×2 IMPLANT
GOWN STRL REUS W/TWL LRG LVL3 (GOWN DISPOSABLE) ×4
LENS IOL ACRSF IQ ULTRA 19.0 (Intraocular Lens) ×1 IMPLANT
LENS IOL ACRYSOF IQ 19.0 (Intraocular Lens) ×3 IMPLANT
MARKER SKIN DUAL TIP RULER LAB (MISCELLANEOUS) ×3 IMPLANT
NDL RETROBULBAR .5 NSTRL (NEEDLE) IMPLANT
NEEDLE FILTER BLUNT 18X 1/2SAF (NEEDLE) ×2
NEEDLE FILTER BLUNT 18X1 1/2 (NEEDLE) ×1 IMPLANT
PACK CATARACT BRASINGTON (MISCELLANEOUS) ×3 IMPLANT
PACK EYE AFTER SURG (MISCELLANEOUS) ×3 IMPLANT
PACK OPTHALMIC (MISCELLANEOUS) ×3 IMPLANT
RING MALYGIN 7.0 (MISCELLANEOUS) ×3 IMPLANT
SUT ETHILON 10-0 CS-B-6CS-B-6 (SUTURE)
SUT VICRYL  9 0 (SUTURE)
SUT VICRYL 9 0 (SUTURE) IMPLANT
SUTURE EHLN 10-0 CS-B-6CS-B-6 (SUTURE) IMPLANT
SYR 3ML LL SCALE MARK (SYRINGE) ×3 IMPLANT
SYR 5ML LL (SYRINGE) ×3 IMPLANT
SYR TB 1ML LUER SLIP (SYRINGE) ×3 IMPLANT
WATER STERILE IRR 250ML POUR (IV SOLUTION) ×3 IMPLANT
WIPE NON LINTING 3.25X3.25 (MISCELLANEOUS) ×3 IMPLANT

## 2016-03-23 NOTE — Anesthesia Procedure Notes (Signed)
Procedure Name: MAC Performed by: Keri Veale Pre-anesthesia Checklist: Patient identified, Emergency Drugs available, Suction available, Timeout performed and Patient being monitored Patient Re-evaluated:Patient Re-evaluated prior to inductionOxygen Delivery Method: Nasal cannula Placement Confirmation: positive ETCO2     

## 2016-03-23 NOTE — Anesthesia Preprocedure Evaluation (Signed)
Anesthesia Evaluation  Patient identified by MRN, date of birth, ID band Patient awake    Reviewed: Allergy & Precautions, H&P , NPO status , Patient's Chart, lab work & pertinent test results, reviewed documented beta blocker date and time   Airway Mallampati: II  TM Distance: >3 FB Neck ROM: full    Dental no notable dental hx.    Pulmonary sleep apnea , COPD (occasional home O2),    Pulmonary exam normal breath sounds clear to auscultation       Cardiovascular Exercise Tolerance: Good hypertension, +CHF  negative cardio ROS  + Valvular Problems/Murmurs (severe TR)  Rhythm:regular Rate:Normal     Neuro/Psych negative neurological ROS  negative psych ROS   GI/Hepatic Neg liver ROS, GERD  ,  Endo/Other  diabetesHypothyroidism   Renal/GU negative Renal ROS  negative genitourinary   Musculoskeletal   Abdominal   Peds  Hematology  (+) anemia ,   Anesthesia Other Findings   Reproductive/Obstetrics negative OB ROS                             Anesthesia Physical Anesthesia Plan  ASA: III  Anesthesia Plan: MAC   Post-op Pain Management:    Induction:   Airway Management Planned:   Additional Equipment:   Intra-op Plan:   Post-operative Plan:   Informed Consent: I have reviewed the patients History and Physical, chart, labs and discussed the procedure including the risks, benefits and alternatives for the proposed anesthesia with the patient or authorized representative who has indicated his/her understanding and acceptance.   Dental Advisory Given  Plan Discussed with: CRNA  Anesthesia Plan Comments:         Anesthesia Quick Evaluation

## 2016-03-23 NOTE — Transfer of Care (Signed)
Immediate Anesthesia Transfer of Care Note  Patient: Laurie Ross  Procedure(s) Performed: Procedure(s) with comments: CATARACT EXTRACTION PHACO AND INTRAOCULAR LENS PLACEMENT (IOC) (Right) - DIABETIC - insulin and oral meds PLEASE LEAVE PT 2ND  Patient Location: PACU  Anesthesia Type: MAC  Level of Consciousness: awake, alert  and patient cooperative  Airway and Oxygen Therapy: Patient Spontanous Breathing and Patient connected to supplemental oxygen  Post-op Assessment: Post-op Vital signs reviewed, Patient's Cardiovascular Status Stable, Respiratory Function Stable, Patent Airway and No signs of Nausea or vomiting  Post-op Vital Signs: Reviewed and stable  Complications: No apparent anesthesia complications

## 2016-03-23 NOTE — Op Note (Signed)
OPERATIVE NOTE  Laurie Ross [id number] 03/23/2016   PREOPERATIVE DIAGNOSIS:    Nuclear Sclerotic Cataract Right eye with miotic pupil.        H25.11  POSTOPERATIVE DIAGNOSIS: Nuclear Sclerotic Cataract Right eye with miotic pupil.          PROCEDURE:  Phacoemusification with posterior chamber intraocular lens placement of the right eye which required pupil stretching with the Malyugin pupil expansion device.  LENS:   Implant Name Type Inv. Item Serial No. Manufacturer Lot No. LRB No. Used  LENS IOL ACRYSOF IQ 19.0 - [id number] Intraocular Lens LENS IOL ACRYSOF IQ 19.0 [id number] ALCON   Right 1       ULTRASOUND TIME: 24 % of 1 minutes 49+ seconds, CDE 26.5  SURGEON:  Deirdre Evenerhadwick R. Awais Cobarrubias, MD   ANESTHESIA:  Topical with tetracaine drops augmented with 1% preservative-free intracameral lidocaine.   COMPLICATIONS:  None.   DESCRIPTION OF PROCEDURE:  The patient was identified in the holding room and transported to the operating room and placed in the supine position under the operating microscope. Theright eye was identified as the operative eye and it was prepped and draped in the usual sterile ophthalmic fashion.   A 1 millimeter clear-corneal paracentesis was made at the 12:00 position.  0.5 ml of preservative-free 1% lidocaine was injected into the anterior chamber. The anterior chamber was filled with Viscoat viscoelastic.  A 2.4 millimeter keratome was used to make a near-clear corneal incision at the 9:00 position. A Malyugin pupil expander was then placed through the main incision and into the anterior chamber of the eye.  The edge of the iris was secured on the lip of the pupil expander and it was released, thereby expanding the pupil to approximately 8 millimeters for completion of the cataract surgery.  Additional Viscoat was placed in the anterior chamber.  A cystotome and capsulorrhexis forceps were used to make a curvilinear capsulorrhexis.   Balanced salt solution  was used to hydrodissect and hydrodelineate the lens nucleus.   Phacoemulsification was used in stop and chop fashion to remove the lens, nucleus and epinucleus.  The remaining cortex was aspirated using the irrigation aspiration handpiece.  Additional Provisc was placed into the eye to distend the capsular bag for lens placement.  A lens was then injected into the capsular bag.  The pupil expanding ring was removed using a Kuglen hook and insertion device. The remaining viscoelastic was aspirated from the capsular bag and the anterior chamber.  The anterior chamber was filled with balanced salt solution to inflate to a physiologic pressure.  Wounds were hydrated with balanced salt solution.  The anterior chamber was inflated to a physiologic pressure with balanced salt solution.  No wound leaks were noted.Vigamox 0.2 ml of a 1mg  per ml solution was injected into the anterior chamber for a dose of 0.2 mg of intracameral antibiotic at the completion of the case. Timolol and Brimonidine drops were applied to the eye.  The patient was taken to the recovery room in stable condition without complications of anesthesia or surgery.  [name] 03/23/2016, 9:50 AM

## 2016-03-23 NOTE — Anesthesia Postprocedure Evaluation (Signed)
Anesthesia Post Note  Patient: Laurie Ross  Procedure(s) Performed: Procedure(s) (LRB): CATARACT EXTRACTION PHACO AND INTRAOCULAR LENS PLACEMENT (IOC) (Right)  Patient location during evaluation: PACU Anesthesia Type: MAC Level of consciousness: awake and alert Pain management: pain level controlled Vital Signs Assessment: post-procedure vital signs reviewed and stable Respiratory status: spontaneous breathing, nonlabored ventilation, respiratory function stable and patient connected to nasal cannula oxygen Cardiovascular status: stable and blood pressure returned to baseline Anesthetic complications: no    Scarlette Sliceachel B Tewana Bohlen

## 2016-03-23 NOTE — H&P (Signed)
The History and Physical notes are on paper, have been signed, and are to be scanned. The patient remains stable and unchanged from the H&P.   Previous H&P reviewed, patient examined, and there are no changes.  Laurie Ross 03/23/2016 8:56 AM

## 2016-03-24 ENCOUNTER — Encounter: Payer: Self-pay | Admitting: Ophthalmology

## 2018-10-21 ENCOUNTER — Emergency Department: Payer: Medicare (Managed Care)

## 2018-10-21 ENCOUNTER — Inpatient Hospital Stay
Admission: EM | Admit: 2018-10-21 | Discharge: 2018-10-23 | DRG: 640 | Disposition: A | Discharge status: Home Health | Payer: Medicare (Managed Care) | Attending: Internal Medicine | Admitting: Internal Medicine

## 2018-10-21 ENCOUNTER — Other Ambulatory Visit: Payer: Self-pay

## 2018-10-21 DIAGNOSIS — N3090 Cystitis, unspecified without hematuria: Secondary | ICD-10-CM | POA: Diagnosis present

## 2018-10-21 DIAGNOSIS — N183 Chronic kidney disease, stage 3 (moderate): Secondary | ICD-10-CM | POA: Diagnosis present

## 2018-10-21 DIAGNOSIS — J449 Chronic obstructive pulmonary disease, unspecified: Secondary | ICD-10-CM | POA: Diagnosis present

## 2018-10-21 DIAGNOSIS — Z888 Allergy status to other drugs, medicaments and biological substances status: Secondary | ICD-10-CM | POA: Diagnosis not present

## 2018-10-21 DIAGNOSIS — Z7951 Long term (current) use of inhaled steroids: Secondary | ICD-10-CM

## 2018-10-21 DIAGNOSIS — G4733 Obstructive sleep apnea (adult) (pediatric): Secondary | ICD-10-CM | POA: Diagnosis present

## 2018-10-21 DIAGNOSIS — Z79890 Hormone replacement therapy (postmenopausal): Secondary | ICD-10-CM

## 2018-10-21 DIAGNOSIS — Z9981 Dependence on supplemental oxygen: Secondary | ICD-10-CM

## 2018-10-21 DIAGNOSIS — N179 Acute kidney failure, unspecified: Secondary | ICD-10-CM | POA: Diagnosis present

## 2018-10-21 DIAGNOSIS — D631 Anemia in chronic kidney disease: Secondary | ICD-10-CM | POA: Diagnosis present

## 2018-10-21 DIAGNOSIS — Z20828 Contact with and (suspected) exposure to other viral communicable diseases: Secondary | ICD-10-CM | POA: Diagnosis present

## 2018-10-21 DIAGNOSIS — H409 Unspecified glaucoma: Secondary | ICD-10-CM | POA: Diagnosis present

## 2018-10-21 DIAGNOSIS — E875 Hyperkalemia: Secondary | ICD-10-CM

## 2018-10-21 DIAGNOSIS — E039 Hypothyroidism, unspecified: Secondary | ICD-10-CM | POA: Diagnosis present

## 2018-10-21 DIAGNOSIS — R4182 Altered mental status, unspecified: Secondary | ICD-10-CM

## 2018-10-21 DIAGNOSIS — K219 Gastro-esophageal reflux disease without esophagitis: Secondary | ICD-10-CM | POA: Diagnosis present

## 2018-10-21 DIAGNOSIS — I5032 Chronic diastolic (congestive) heart failure: Secondary | ICD-10-CM | POA: Diagnosis present

## 2018-10-21 DIAGNOSIS — G9341 Metabolic encephalopathy: Secondary | ICD-10-CM | POA: Diagnosis present

## 2018-10-21 DIAGNOSIS — E785 Hyperlipidemia, unspecified: Secondary | ICD-10-CM | POA: Diagnosis present

## 2018-10-21 DIAGNOSIS — Z79899 Other long term (current) drug therapy: Secondary | ICD-10-CM

## 2018-10-21 DIAGNOSIS — Z803 Family history of malignant neoplasm of breast: Secondary | ICD-10-CM

## 2018-10-21 DIAGNOSIS — E1122 Type 2 diabetes mellitus with diabetic chronic kidney disease: Secondary | ICD-10-CM | POA: Diagnosis present

## 2018-10-21 DIAGNOSIS — Z885 Allergy status to narcotic agent status: Secondary | ICD-10-CM | POA: Diagnosis not present

## 2018-10-21 DIAGNOSIS — F329 Major depressive disorder, single episode, unspecified: Secondary | ICD-10-CM | POA: Diagnosis present

## 2018-10-21 DIAGNOSIS — E876 Hypokalemia: Secondary | ICD-10-CM | POA: Diagnosis present

## 2018-10-21 DIAGNOSIS — I130 Hypertensive heart and chronic kidney disease with heart failure and stage 1 through stage 4 chronic kidney disease, or unspecified chronic kidney disease: Secondary | ICD-10-CM | POA: Diagnosis present

## 2018-10-21 DIAGNOSIS — Z880 Allergy status to penicillin: Secondary | ICD-10-CM

## 2018-10-21 DIAGNOSIS — Z794 Long term (current) use of insulin: Secondary | ICD-10-CM

## 2018-10-21 DIAGNOSIS — Z7982 Long term (current) use of aspirin: Secondary | ICD-10-CM

## 2018-10-21 DIAGNOSIS — E860 Dehydration: Secondary | ICD-10-CM | POA: Diagnosis present

## 2018-10-21 DIAGNOSIS — Z79891 Long term (current) use of opiate analgesic: Secondary | ICD-10-CM

## 2018-10-21 DIAGNOSIS — Z8249 Family history of ischemic heart disease and other diseases of the circulatory system: Secondary | ICD-10-CM

## 2018-10-21 LAB — COMPREHENSIVE METABOLIC PANEL
ALT: 17 U/L (ref 0–44)
AST: 22 U/L (ref 15–41)
Albumin: 3.7 g/dL (ref 3.5–5.0)
Alkaline Phosphatase: 55 U/L (ref 38–126)
Anion gap: 5 (ref 5–15)
BUN: 50 mg/dL — ABNORMAL HIGH (ref 8–23)
CO2: 17 mmol/L — ABNORMAL LOW (ref 22–32)
Calcium: 9.7 mg/dL (ref 8.9–10.3)
Chloride: 114 mmol/L — ABNORMAL HIGH (ref 98–111)
Creatinine, Ser: 1.63 mg/dL — ABNORMAL HIGH (ref 0.44–1.00)
GFR calc Af Amer: 34 mL/min — ABNORMAL LOW (ref >60)
GFR calc non Af Amer: 30 mL/min — ABNORMAL LOW (ref >60)
Glucose, Bld: 157 mg/dL — ABNORMAL HIGH (ref 70–99)
Potassium: 6.5 mmol/L (ref 3.5–5.1)
Sodium: 136 mmol/L (ref 135–145)
Total Bilirubin: 0.6 mg/dL (ref 0.3–1.2)
Total Protein: 6.9 g/dL (ref 6.5–8.1)

## 2018-10-21 LAB — URINALYSIS, ROUTINE W REFLEX MICROSCOPIC
Bilirubin Urine: NEGATIVE
Glucose, UA: NEGATIVE mg/dL
Ketones, ur: NEGATIVE mg/dL
Nitrite: NEGATIVE
Protein, ur: NEGATIVE mg/dL
Specific Gravity, Urine: 1.016 (ref 1.005–1.030)
WBC, UA: >50 WBC/hpf — ABNORMAL HIGH (ref 0–5)
pH: 5 (ref 5.0–8.0)

## 2018-10-21 LAB — SARS CORONAVIRUS 2 BY RT PCR (HOSPITAL ORDER, PERFORMED IN ~~LOC~~ HOSPITAL LAB): SARS Coronavirus 2: NEGATIVE

## 2018-10-21 LAB — CBC WITH DIFFERENTIAL/PLATELET
Abs Immature Granulocytes: 0.03 10*3/uL (ref 0.00–0.07)
Basophils Absolute: 0 10*3/uL (ref 0.0–0.1)
Basophils Relative: 1 %
Eosinophils Absolute: 0.2 10*3/uL (ref 0.0–0.5)
Eosinophils Relative: 2 %
HCT: 35.4 % — ABNORMAL LOW (ref 36.0–46.0)
Hemoglobin: 11.6 g/dL — ABNORMAL LOW (ref 12.0–15.0)
Immature Granulocytes: 0 %
Lymphocytes Relative: 16 %
Lymphs Abs: 1.3 10*3/uL (ref 0.7–4.0)
MCH: 33 pg (ref 26.0–34.0)
MCHC: 32.8 g/dL (ref 30.0–36.0)
MCV: 100.6 fL — ABNORMAL HIGH (ref 80.0–100.0)
Monocytes Absolute: 0.4 10*3/uL (ref 0.1–1.0)
Monocytes Relative: 6 %
Neutro Abs: 5.7 10*3/uL (ref 1.7–7.7)
Neutrophils Relative %: 75 %
Platelets: 143 10*3/uL — ABNORMAL LOW (ref 150–400)
RBC: 3.52 MIL/uL — ABNORMAL LOW (ref 3.87–5.11)
RDW: 14.9 % (ref 11.5–15.5)
WBC: 7.7 10*3/uL (ref 4.0–10.5)
nRBC: 0 % (ref 0.0–0.2)

## 2018-10-21 LAB — BLOOD GAS, VENOUS
Acid-base deficit: 10.9 mmol/L — ABNORMAL HIGH (ref 0.0–2.0)
Bicarbonate: 15.9 mmol/L — ABNORMAL LOW (ref 20.0–28.0)
O2 Saturation: 54.3 %
Patient temperature: 37
pCO2, Ven: 38 mmHg — ABNORMAL LOW (ref 44.0–60.0)
pH, Ven: 7.23 — ABNORMAL LOW (ref 7.250–7.430)
pO2, Ven: 35 mmHg (ref 32.0–45.0)

## 2018-10-21 MED ORDER — INSULIN ASPART 100 UNIT/ML ~~LOC~~ SOLN
6.0000 [IU] | Freq: Once | SUBCUTANEOUS | Status: AC
  Administered 2018-10-21: 15:00:00 6 [IU] via INTRAVENOUS
  Filled 2018-10-21: qty 1

## 2018-10-21 MED ORDER — SODIUM CHLORIDE 0.9 % IV SOLN
1.0000 g | Freq: Once | INTRAVENOUS | Status: AC
  Administered 2018-10-21: 1 g via INTRAVENOUS
  Filled 2018-10-21: qty 10

## 2018-10-21 MED ORDER — ALBUTEROL SULFATE (2.5 MG/3ML) 0.083% IN NEBU
2.5000 mg | INHALATION_SOLUTION | Freq: Once | RESPIRATORY_TRACT | Status: AC
  Administered 2018-10-21: 16:00:00 2.5 mg via RESPIRATORY_TRACT
  Filled 2018-10-21: qty 3

## 2018-10-21 MED ORDER — ONDANSETRON HCL 4 MG/2ML IJ SOLN: 4.0000 mg | Freq: Four times a day (QID) | INTRAMUSCULAR | Status: DC | PRN

## 2018-10-21 MED ORDER — BISACODYL 5 MG PO TBEC: 5.0000 mg | DELAYED_RELEASE_TABLET | Freq: Every day | ORAL | Status: DC | PRN

## 2018-10-21 MED ORDER — SODIUM ZIRCONIUM CYCLOSILICATE 10 G PO PACK
10.0000 g | PACK | Freq: Once | ORAL | Status: AC
  Administered 2018-10-21: 10 g via ORAL
  Filled 2018-10-21: qty 1

## 2018-10-21 MED ORDER — SODIUM CHLORIDE 0.9 % IV SOLN
1.0000 g | INTRAVENOUS | Status: DC
  Administered 2018-10-22: 1 g via INTRAVENOUS
  Filled 2018-10-21: qty 10
  Filled 2018-10-21: qty 1

## 2018-10-21 MED ORDER — DEXTROSE 50 % IV SOLN
1.0000 | Freq: Once | INTRAVENOUS | Status: AC
  Administered 2018-10-21: 50 mL via INTRAVENOUS
  Filled 2018-10-21: qty 50

## 2018-10-21 MED ORDER — SODIUM CHLORIDE 0.9 % IV SOLN
INTRAVENOUS | Status: DC
  Administered 2018-10-21 – 2018-10-22 (×2): via INTRAVENOUS

## 2018-10-21 MED ORDER — SENNOSIDES-DOCUSATE SODIUM 8.6-50 MG PO TABS
1.0000 | ORAL_TABLET | Freq: Every evening | ORAL | Status: DC | PRN
  Filled 2018-10-21: qty 1

## 2018-10-21 MED ORDER — ACETAMINOPHEN 650 MG RE SUPP: 650.0000 mg | Freq: Four times a day (QID) | RECTAL | Status: DC | PRN

## 2018-10-21 MED ORDER — ONDANSETRON HCL 4 MG PO TABS: 4.0000 mg | ORAL_TABLET | Freq: Four times a day (QID) | ORAL | Status: DC | PRN

## 2018-10-21 MED ORDER — HEPARIN SODIUM (PORCINE) 5000 UNIT/ML IJ SOLN
5000.0000 [IU] | Freq: Three times a day (TID) | INTRAMUSCULAR | Status: DC
  Administered 2018-10-21 – 2018-10-23 (×5): 5000 [IU] via SUBCUTANEOUS
  Filled 2018-10-21 (×5): qty 1

## 2018-10-21 MED ORDER — ACETAMINOPHEN 325 MG PO TABS
650.0000 mg | ORAL_TABLET | Freq: Four times a day (QID) | ORAL | Status: DC | PRN
  Filled 2018-10-21: qty 2

## 2018-10-21 MED ORDER — ALBUTEROL SULFATE (2.5 MG/3ML) 0.083% IN NEBU: 2.5000 mg | INHALATION_SOLUTION | RESPIRATORY_TRACT | Status: DC | PRN

## 2018-10-21 MED ORDER — SODIUM BICARBONATE 8.4 % IV SOLN
50.0000 meq | Freq: Once | INTRAVENOUS | Status: AC
  Administered 2018-10-21: 15:00:00 50 meq via INTRAVENOUS
  Filled 2018-10-21: qty 50

## 2018-10-21 MED ORDER — CALCIUM GLUCONATE 10 % IV SOLN
1.0000 g | Freq: Once | INTRAVENOUS | Status: AC
  Administered 2018-10-21: 15:00:00 1 g via INTRAVENOUS
  Filled 2018-10-21: qty 10

## 2018-10-21 NOTE — ED Notes (Signed)
Report given to Lisa, RN.

## 2018-10-21 NOTE — ED Notes (Signed)
Per [facility] doctor who saw pt on Friday, all symptoms such as leaning to the right, tremor and AMS are new symptoms that she did not present with on Friday

## 2018-10-21 NOTE — ED Notes (Signed)
ED TO INPATIENT HANDOFF REPORT  ED Nurse Name and Phone #: Shanda BumpsJessica [phone]   S Name/Age/Gender Pecola Leisureoris L Westergaard 78 y.o. female Room/Bed: ED17A/ED17A  Code Status   Code Status: Prior  Home/SNF/Other Home Patient oriented to: self, place, time and situation Is this baseline? Yes   Triage Complete: Triage complete  Chief Complaint AMS  Triage Note Pt arrives via EMS for altered mental status as reported by her family. Family reports she has been weak on one side (negative stroke screen by EMS), and experiencing hallucinations. Family states her symptoms have been present for 2 days. Pt answers my questions appropriately and denies seeing or hearing things that are not there. Pt reports she lives with her daughter Babette Relicammy [phone]   Allergies Allergies  Allergen Reactions  . Codeine   . Glucophage [Metformin Hcl]   . Loratadine   . Penicillins Itching    Has patient had a PCN reaction causing immediate rash, facial/tongue/throat swelling, SOB or lightheadedness with hypotension: No Has patient had a PCN reaction causing severe rash involving mucus membranes or skin necrosis: No Has patient had a PCN reaction that required hospitalization No Has patient had a PCN reaction occurring within the last 10 years: No If all of the above answers are "NO", then may proceed with Cephalosporin use.     Level of Care/Admitting Diagnosis ED Disposition    ED Disposition Condition Comment   Admit  [facility]ospital [facility] [[id number]]  Level of Care: Med-Surg [16]  Covid Evaluation: Confirmed COVID Negative  Diagnosis: Hypokalemia [[id number]][[id number]]  Admitting Physician: Shaune PollackCHEN, QING [[id number]]  Attending Physician: Shaune PollackCHEN, QING [[id number]][[id number]]  Estimated length of stay: past midnight tomorrow  Certification:: I certify this patient will need inpatient services for at least 2 midnights  PT Class (Do Not Modify): Inpatient [101]  PT Acc Code (Do Not Modify): Private [1]        B Medical/Surgery History Past Medical History:  Diagnosis Date  . Anemia   . Arthritis   . Asthma   . CHF (congestive heart failure) (HCC)   . Chronic kidney disease    stage III  . COPD (chronic obstructive pulmonary disease) (HCC)    occasional home O2 use  . Depression   . Diabetes mellitus without complication (HCC)   . Dysrhythmia    bradycardia  . GERD (gastroesophageal reflux disease)   . Glaucoma   . Hx of Clostridium difficile infection March 2016  . Hyperlipidemia   . Hypertension   . Hypothyroidism   . Obstructive sleep apnea   . Sciatica   . Venous insufficiency    Past Surgical History:  Procedure Laterality Date  . BREAST BIOPSY    . CATARACT EXTRACTION W/PHACO Right 03/23/2016   Procedure: CATARACT EXTRACTION PHACO AND INTRAOCULAR LENS PLACEMENT (IOC);  Surgeon: Lockie Molahadwick Brasington, MD;  [facility];  Service: Ophthalmology;  Laterality: Right;  DIABETIC - insulin and oral meds PLEASE LEAVE PT 2ND     A IV Location/Drains/Wounds Patient Lines/Drains/Airways Status   Active Line/Drains/Airways    Name:   Placement date:   Placement time:   Site:   Days:   Peripheral IV 09/26/14 Left Hand   09/26/14    0225    Hand   1486   Peripheral IV 10/21/18 Left Antecubital   10/21/18    1313    Antecubital   less than 1   Incision (Closed) 03/23/16 Eye Right   03/23/16    [id number]  942          Intake/Output Last 24 hours No intake or output data in the 24 hours ending 10/21/18 1605  Labs/Imaging Results for orders placed or performed during the hospital encounter of 10/21/18 (from the past 48 hour(s))  CBC with Differential/Platelet     Status: Abnormal   Collection Time: 10/21/18  1:46 PM  Result Value Ref Range   WBC 7.7 4.0 - 10.5 K/uL   RBC 3.52 (L) 3.87 - 5.11 MIL/uL   Hemoglobin 11.6 (L) 12.0 - 15.0 g/dL   HCT 35.4 (L) 36.0 - 46.0 %   MCV 100.6 (H) 80.0 - 100.0 fL   MCH 33.0 26.0 - 34.0 pg   MCHC 32.8 30.0 - 36.0 g/dL    RDW 14.9 11.5 - 15.5 %   Platelets 143 (L) 150 - 400 K/uL   nRBC 0.0 0.0 - 0.2 %   Neutrophils Relative % 75 %   Neutro Abs 5.7 1.7 - 7.7 K/uL   Lymphocytes Relative 16 %   Lymphs Abs 1.3 0.7 - 4.0 K/uL   Monocytes Relative 6 %   Monocytes Absolute 0.4 0.1 - 1.0 K/uL   Eosinophils Relative 2 %   Eosinophils Absolute 0.2 0.0 - 0.5 K/uL   Basophils Relative 1 %   Basophils Absolute 0.0 0.0 - 0.1 K/uL   Immature Granulocytes 0 %   Abs Immature Granulocytes 0.03 0.00 - 0.07 K/uL    Comment: Performed at [facility], [address] [place], [place] [address]  Comprehensive metabolic panel     Status: Abnormal   Collection Time: 10/21/18  1:46 PM  Result Value Ref Range   Sodium 136 135 - 145 mmol/L   Potassium 6.5 (HH) 3.5 - 5.1 mmol/L    Comment: CRITICAL RESULT CALLED TO, READ BACK BY AND VERIFIED WITH Josua Ferrebee AT 1414 10/21/2018.PMF   Chloride 114 (H) 98 - 111 mmol/L   CO2 17 (L) 22 - 32 mmol/L   Glucose, Bld 157 (H) 70 - 99 mg/dL   BUN 50 (H) 8 - 23 mg/dL   Creatinine, Ser 1.63 (H) 0.44 - 1.00 mg/dL   Calcium 9.7 8.9 - 10.3 mg/dL   Total Protein 6.9 6.5 - 8.1 g/dL   Albumin 3.7 3.5 - 5.0 g/dL   AST 22 15 - 41 U/L   ALT 17 0 - 44 U/L   Alkaline Phosphatase 55 38 - 126 U/L   Total Bilirubin 0.6 0.3 - 1.2 mg/dL   GFR calc non Af Amer 30 (L) >60 mL/min   GFR calc Af Amer 34 (L) >60 mL/min   Anion gap 5 5 - 15    Comment: Performed at [facility], [address] [place], [place] [address]  Blood gas, venous     Status: Abnormal   Collection Time: 10/21/18  2:16 PM  Result Value Ref Range   pH, Ven 7.23 (L) 7.250 - 7.430   pCO2, Ven 38 (L) 44.0 - 60.0 mmHg   pO2, Ven 35.0 32.0 - 45.0 mmHg   Bicarbonate 15.9 (L) 20.0 - 28.0 mmol/L   Acid-base deficit 10.9 (H) 0.0 - 2.0 mmol/L   O2 Saturation 54.3 %   Patient temperature 37.0    Collection site VEIN    Sample type VEIN     Comment: Performed at [facility], [address]  [place], [place] [address]  Urinalysis, Routine w reflex microscopic     Status: Abnormal   Collection Time: 10/21/18  2:17 PM  Result Value Ref Range   Color, Urine YELLOW (A) YELLOW   APPearance TURBID (A) CLEAR   Specific Gravity, Urine 1.016 1.005 - 1.030   pH 5.0 5.0 - 8.0   Glucose, UA NEGATIVE NEGATIVE mg/dL   Hgb urine dipstick SMALL (A) NEGATIVE   Bilirubin Urine NEGATIVE NEGATIVE   Ketones, ur NEGATIVE NEGATIVE mg/dL   Protein, ur NEGATIVE NEGATIVE mg/dL   Nitrite NEGATIVE NEGATIVE   Leukocytes,Ua LARGE (A) NEGATIVE   RBC / HPF 6-10 0 - 5 RBC/hpf   WBC, UA >50 (H) 0 - 5 WBC/hpf   Bacteria, UA MANY (A) NONE SEEN   Squamous Epithelial / LPF 0-5 0 - 5   WBC Clumps PRESENT    Mucus PRESENT    Hyaline Casts, UA PRESENT     Comment: Performed at [facility], [address] [place], [place] [address]  SARS Coronavirus 2 (CEPHEID- Performed in [facility] hospital lab), Hosp Order     Status: None   Collection Time: 10/21/18  2:53 PM   Specimen: Nasopharyngeal Swab  Result Value Ref Range   SARS Coronavirus 2 NEGATIVE NEGATIVE    Comment: (NOTE) If result is NEGATIVE SARS-CoV-2 target nucleic acids are NOT DETECTED. The SARS-CoV-2 RNA is generally detectable in upper and lower  respiratory specimens during the acute phase of infection. The lowest  concentration of SARS-CoV-2 viral copies this assay can detect is 250  copies / mL. A negative result does not preclude SARS-CoV-2 infection  and should not be used as the sole basis for treatment or other  patient management decisions.  A negative result may occur with  improper specimen collection / handling, submission of specimen other  than nasopharyngeal swab, presence of viral mutation(s) within the  areas targeted by this assay, and inadequate number of viral copies  (<250 copies / mL). A negative result must be combined with clinical  observations, patient history, and epidemiological information. If result is  POSITIVE SARS-CoV-2 target nucleic acids are DETECTED. The SARS-CoV-2 RNA is generally detectable in upper and lower  respiratory specimens dur ing the acute phase of infection.  Positive  results are indicative of active infection with SARS-CoV-2.  Clinical  correlation with patient history and other diagnostic information is  necessary to determine patient infection status.  Positive results do  not rule out bacterial infection or co-infection with other viruses. If result is PRESUMPTIVE POSTIVE SARS-CoV-2 nucleic acids MAY BE PRESENT.   A presumptive positive result was obtained on the submitted specimen  and confirmed on repeat testing.  While 2019 novel coronavirus  (SARS-CoV-2) nucleic acids may be present in the submitted sample  additional confirmatory testing may be necessary for epidemiological  and / or clinical management purposes  to differentiate between  SARS-CoV-2 and other Sarbecovirus currently known to infect humans.  If clinically indicated additional testing with an alternate test  methodology [phone]([id number]) is advised. The SARS-CoV-2 RNA is generally  detectable in upper and lower respiratory sp ecimens during the acute  phase of infection. The expected result is Negative. Fact Sheet for Patients:  BoilerBrush.com.cyhttps://www.fda.gov/media/136312/download Fact Sheet for Healthcare Providers: https://pope.com/https://www.fda.gov/media/136313/download This test is not yet approved or cleared by the [place] FDA and has been authorized for detection and/or diagnosis of SARS-CoV-2 by FDA under an Emergency Use Authorization (EUA).  This EUA will remain in effect (meaning this test can be used) for the duration of the COVID-19 declaration under Section 564(b)(1) of the Act, 21 U.S.C. section  360bbb-3(b)(1), unless the authorization is terminated or revoked sooner. Performed at [facility], [address] [place], [place] [address]    Ct Head Wo Contrast  Result Date:  10/21/2018 CLINICAL DATA:  Altered mental status, unilateral weakness EXAM: CT HEAD WITHOUT CONTRAST TECHNIQUE: Contiguous axial images were obtained from the base of the skull through the vertex without intravenous contrast. COMPARISON:  02/19/2014 FINDINGS: Brain: Periventricular white matter hypodensity. There is more focal hypodensity of the right corona radiata (series 2, image 18), which appears new compared to CT dated 02/19/2014, although is age indeterminate. Vascular: No hyperdense vessel or unexpected calcification. Skull: Normal. Negative for fracture or focal lesion. Sinuses/Orbits: No acute finding. Other: None. IMPRESSION: Small-vessel white matter disease. There is more focal hypodensity of the right corona radiata (series 2, image 18), which appears new compared to CT dated 02/19/2014, although is age indeterminate. Correlate with clinical findings (i.e. Left-sided weakness) and consider MRI to further evaluate for diffusion restricting infarction if suspected. Electronically Signed   By: Lauralyn PrimesAlex  Bibbey M.D.   On: 10/21/2018 14:33   Dg Chest Portable 1 View  Result Date: 10/21/2018 CLINICAL DATA:  Altered mental status.  Hypertension. EXAM: PORTABLE CHEST 1 VIEW COMPARISON:  November 25, 2015 FINDINGS: There is mild scarring in the left mid lung. There is no edema or consolidation. Heart is upper normal in size with pulmonary vascularity normal. No adenopathy. There is aortic atherosclerosis. There is calcification in each carotid artery. No bone lesions. IMPRESSION: Mild scarring left mid lung. No edema or consolidation. Stable cardiac silhouette. There is bilateral carotid artery calcification. Aortic Atherosclerosis (ICD10-I70.0). Electronically Signed   By: Bretta BangWilliam  Woodruff III M.D.   On: 10/21/2018 15:11    Pending Labs [employer]ed Labs (From admission, onward)    Start     Ordered   Signed and Armed forces training and education officerHeld  Basic metabolic panel  Tomorrow morning,   R     Signed and Held   Signed and Held  CBC   Tomorrow morning,   R     Signed and Held          Vitals/Pain Today's Vitals   10/21/18 1330 10/21/18 1513 10/21/18 1515 10/21/18 1530  BP: (!) 171/82 (!) 161/54 (!) 173/94 (!) 149/84  Pulse: 85 100 99 96  Resp: 17 (!) 22 13 14   Temp:      TempSrc:      SpO2: 97% 100% 100% 100%  Weight:      Height:      PainSc:        Isolation Precautions Airborne and Contact precautions  Medications Medications  albuterol (PROVENTIL) (2.5 MG/3ML) 0.083% nebulizer solution 2.5 mg (has no administration in time range)  calcium gluconate inj 10% (1 g) URGENT USE ONLY! (1 g Intravenous Given 10/21/18 1440)  insulin aspart (novoLOG) injection 6 Units (6 Units Intravenous Given 10/21/18 1436)  dextrose 50 % solution 50 mL (50 mLs Intravenous Given 10/21/18 1435)  sodium bicarbonate injection 50 mEq (50 mEq Intravenous Given 10/21/18 1440)  sodium zirconium cyclosilicate (LOKELMA) packet 10 g (10 g Oral Given 10/21/18 1534)  cefTRIAXone (ROCEPHIN) 1 g in sodium chloride 0.9 % 100 mL IVPB (1 g Intravenous New Bag/Given 10/21/18 1534)    Mobility non-ambulatory High fall risk   Focused Assessments 1   R Recommendations: See Admitting Provider Note  Report given to:   Additional Notes:

## 2018-10-21 NOTE — ED Notes (Signed)
Daughter [name] notified of pt's status and plan of care

## 2018-10-21 NOTE — Progress Notes (Signed)
The patient has arrived on the floor and I, Edrick Oh, RN, has assumed care of this patient.  It was reported to me that the patient is Covid negative and therefore all precautions are discontinued,

## 2018-10-21 NOTE — H&P (Signed)
[facility] NAME: Elayne Gruver    MR#:  [id number]  DATE OF BIRTH:  09-Dec-1940  DATE OF ADMISSION:  10/21/2018  PRIMARY CARE PHYSICIAN: [facility]   REQUESTING/REFERRING PHYSICIAN: Dr. Quentin Cornwall.  CHIEF COMPLAINT:   Chief Complaint  Patient presents with  . Altered Mental Status   Altered mental status. HISTORY OF PRESENT ILLNESS:  Abbiegail Landgren  is a 78 y.o. female with a known history of anemia, CHF, CKD stage III, COPD, hypertension, diabetes, hypothyroidism, OSA and etc.  The patient is sent to ED due to altered mental status.  Her daughter reports that she was normal 2 days ago but developed generalized weakness and confusion for the past 2 days.  She is found UTI, acute renal failure due to dehydration and high potassium at 6.5 in the ED.  She is treated with calcium gluconate, D50 with insulin, bicarb and Lokelma. PAST MEDICAL HISTORY:   Past Medical History:  Diagnosis Date  . Anemia   . Arthritis   . Asthma   . CHF (congestive heart failure) ([place])   . Chronic kidney disease    stage III  . COPD (chronic obstructive pulmonary disease) ([place])    occasional home O2 use  . Depression   . Diabetes mellitus without complication ([place])   . Dysrhythmia    bradycardia  . GERD (gastroesophageal reflux disease)   . Glaucoma   . Hx of Clostridium difficile infection March 2016  . Hyperlipidemia   . Hypertension   . Hypothyroidism   . Obstructive sleep apnea   . Sciatica   . Venous insufficiency     PAST SURGICAL HISTORY:   Past Surgical History:  Procedure Laterality Date  . BREAST BIOPSY    . CATARACT EXTRACTION W/PHACO Right 03/23/2016   Procedure: CATARACT EXTRACTION PHACO AND INTRAOCULAR LENS PLACEMENT (IOC);  Surgeon: Leandrew Koyanagi, MD;  Location: [facility];  Service: Ophthalmology;  Laterality: Right;  DIABETIC - insulin and oral meds PLEASE LEAVE PT 2ND    SOCIAL  HISTORY:   Social History   Tobacco Use  . Smoking status: Never Smoker  . Smokeless tobacco: Never Used  Substance Use Topics  . Alcohol use: No    FAMILY HISTORY:   Family History  Problem Relation Age of Onset  . Breast cancer Mother   . Heart disease Father     DRUG ALLERGIES:   Allergies  Allergen Reactions  . Codeine   . Glucophage [Metformin Hcl]   . Loratadine   . Penicillins Itching    Has patient had a PCN reaction causing immediate rash, facial/tongue/throat swelling, SOB or lightheadedness with hypotension: No Has patient had a PCN reaction causing severe rash involving mucus membranes or skin necrosis: No Has patient had a PCN reaction that required hospitalization No Has patient had a PCN reaction occurring within the last 10 years: No If all of the above answers are "NO", then may proceed with Cephalosporin use.     REVIEW OF SYSTEMS:   Review of Systems  Unable to perform ROS: Mental status change    MEDICATIONS AT HOME:   Prior to Admission medications   Medication Sig Start Date End Date Taking? Authorizing Provider  acetaminophen (TYLENOL) 325 MG tablet Take 2 tablets (650 mg total) by mouth every 6 (six) hours as needed for mild pain (or Fever >/= 101). Patient taking differently: Take 650 mg by mouth 2 (two) times daily.  09/28/14  Ramonita LabGouru, Aruna, MD  albuterol (PROVENTIL) (2.5 MG/3ML) 0.083% nebulizer solution Take 2.5 mg by nebulization every 6 (six) hours as needed for wheezing or shortness of breath.    [provider]  amLODipine (NORVASC) 5 MG tablet Take 5 mg by mouth daily.    [provider]  amLODipine-atorvastatin (CADUET) 5-10 MG per tablet Take 1 tablet by mouth at bedtime.    [provider]  aspirin EC 81 MG tablet Take 81 mg by mouth daily.    [provider]  atenolol (TENORMIN) 50 MG tablet Take 50 mg by mouth daily.    [provider]  budesonide-formoterol (SYMBICORT) 160-4.5 MCG/ACT  inhaler Inhale 2 puffs into the lungs 2 (two) times daily.    [provider]  calcium carbonate (TUMS - DOSED IN MG ELEMENTAL CALCIUM) 500 MG chewable tablet Chew 1 tablet by mouth 2 (two) times daily.    [provider]  CALCIUM-VITAMIN D PO Take 1 tablet by mouth 2 (two) times daily. osteoporosis    [provider]  cetirizine (ZYRTEC) 10 MG tablet Take 5 mg by mouth daily.     [provider]  clotrimazole (LOTRIMIN) 1 % cream Apply 1 application topically 2 (two) times daily. Between toes    [provider]  docusate sodium (COLACE) 100 MG capsule Take 100 mg by mouth daily.    [provider]  dorzolamide-timolol (COSOPT) 22.3-6.8 MG/ML ophthalmic solution Place 1 drop into both eyes 2 (two) times daily.    [provider]  enalapril (VASOTEC) 20 MG tablet Take 20 mg by mouth daily.     [provider]  ferrous sulfate 325 (65 FE) MG tablet Take 325 mg by mouth at bedtime.    [provider]  fluticasone (FLONASE) 50 MCG/ACT nasal spray Place 2 sprays into both nostrils daily.    [provider]  furosemide (LASIX) 80 MG tablet Take 0.5 tablets (40 mg total) by mouth daily. 11/27/15   Adrian SaranMody, Sital, MD  glucose 4 GM chewable tablet Chew 1 tablet by mouth as needed for low blood sugar.    [provider]  hydrocortisone (ANUSOL-HC) 2.5 % rectal cream Place 1 application rectally 3 (three) times daily as needed for hemorrhoids or itching.    [provider]  hydrocortisone cream 1 % Apply 1 application topically 3 (three) times daily as needed for itching.    [provider]  insulin glargine (LANTUS) 100 UNIT/ML injection Inject 10 Units into the skin at bedtime.    [provider]  insulin NPH-regular Human (NOVOLIN 70/30) (70-30) 100 UNIT/ML injection Inject 34 Units into the skin 2 (two) times daily with a meal. 30 units in the am. 12 units in the pm.    [provider]  latanoprost (XALATAN) 0.005 % ophthalmic solution Place 1 drop into both eyes at bedtime.    [provider]  levothyroxine (SYNTHROID, LEVOTHROID) 50 MCG tablet Take 50 mcg by mouth daily.    [provider]  loperamide (IMODIUM) 2 MG capsule Take 2 mg by mouth as needed for diarrhea or loose stools.    [provider]  magnesium hydroxide (MILK OF MAGNESIA) 400 MG/5ML suspension Take 15 mLs by mouth 4 (four) times daily.    [provider]  metoCLOPramide (REGLAN) 5 MG tablet Take 5 mg by mouth 3 (three) times daily before meals.    [provider]  OXYGEN Inhale 2 L into the lungs as needed.  [provider]  psyllium (HYDROCIL/METAMUCIL) 95 % PACK Take 1 packet by mouth 2 (two) times daily.    [provider]  ranitidine (ZANTAC) 150 MG tablet Take 150 mg by mouth 2 (two) times daily.    [provider]  sertraline (ZOLOFT) 100 MG tablet Take 150 mg by mouth daily.    [provider]  spironolactone (ALDACTONE) 25 MG tablet Take 50 mg by mouth daily. For fluid    [provider]  traMADol (ULTRAM) 50 MG tablet Take 25 mg by mouth every 6 (six) hours as needed.    [provider]  traZODone (DESYREL) 100 MG tablet Take 100 mg by mouth at bedtime.    [provider]  trolamine salicylate (ASPERCREME) 10 % cream Apply 1 application topically as needed for muscle pain.    [provider]  Vitamins A & D (VITAMIN A & D) ointment Apply 1 application topically every 12 (twelve) hours.    [provider]      VITAL SIGNS:  Blood pressure (!) 173/94, pulse 99, temperature 98.3 F (36.8 C), temperature source Oral, resp. rate 13, height 5\' 1"  (1.549 m), weight 80.7 kg, SpO2 100 %.  PHYSICAL EXAMINATION:  Physical Exam  GENERAL:  78 y.o.-year-old patient lying in the bed with no acute distress.  EYES: Pupils equal, round, reactive to light and  accommodation. No scleral icterus. Extraocular muscles intact.  HEENT: Head atraumatic, normocephalic. NECK:  Supple, no jugular venous distention. No thyroid enlargement, no tenderness.  LUNGS: Normal breath sounds bilaterally, no wheezing, rales,rhonchi or crepitation. No use of accessory muscles of respiration.  CARDIOVASCULAR: S1, S2 normal. No murmurs, rubs, or gallops.  ABDOMEN: Soft, nontender, nondistended. Bowel sounds present. No organomegaly or mass.  EXTREMITIES: No pedal edema, cyanosis, or clubbing.  NEUROLOGIC: Unable to exam. PSYCHIATRIC: The patient is confused. SKIN: No obvious rash, lesion, or ulcer.   LABORATORY PANEL:   CBC Recent Labs  Lab 10/21/18 1346  WBC 7.7  HGB 11.6*  HCT 35.4*  PLT 143*   ------------------------------------------------------------------------------------------------------------------  Chemistries  Recent Labs  Lab 10/21/18 1346  NA 136  K 6.5*  CL 114*  CO2 17*  GLUCOSE 157*  BUN 50*  CREATININE 1.63*  CALCIUM 9.7  AST 22  ALT 17  ALKPHOS 55  BILITOT 0.6   ------------------------------------------------------------------------------------------------------------------  Cardiac Enzymes No results for input(s): TROPONINI in the last 168 hours. ------------------------------------------------------------------------------------------------------------------  RADIOLOGY:  Ct Head Wo Contrast  Result Date: 10/21/2018 CLINICAL DATA:  Altered mental status, unilateral weakness EXAM: CT HEAD WITHOUT CONTRAST TECHNIQUE: Contiguous axial images were obtained from the base of the skull through the vertex without intravenous contrast. COMPARISON:  02/19/2014 FINDINGS: Brain: Periventricular white matter hypodensity. There is more focal hypodensity of the right corona radiata (series 2, image 18), which appears new compared to CT dated 02/19/2014, although is age indeterminate. Vascular: No hyperdense vessel or unexpected  calcification. Skull: Normal. Negative for fracture or focal lesion. Sinuses/Orbits: No acute finding. Other: None. IMPRESSION: Small-vessel white matter disease. There is more focal hypodensity of the right corona radiata (series 2, image 18), which appears new compared to CT dated 02/19/2014, although is age indeterminate. Correlate with clinical findings (i.e. Left-sided weakness) and consider MRI to further evaluate for diffusion restricting infarction if suspected. Electronically Signed   By: Lauralyn PrimesAlex  Bibbey M.D.   On: 10/21/2018 14:33   Dg Chest Portable 1 View  Result Date: 10/21/2018 CLINICAL DATA:  Altered mental status.  Hypertension. EXAM: PORTABLE  CHEST 1 VIEW COMPARISON:  November 25, 2015 FINDINGS: There is mild scarring in the left mid lung. There is no edema or consolidation. Heart is upper normal in size with pulmonary vascularity normal. No adenopathy. There is aortic atherosclerosis. There is calcification in each carotid artery. No bone lesions. IMPRESSION: Mild scarring left mid lung. No edema or consolidation. Stable cardiac silhouette. There is bilateral carotid artery calcification. Aortic Atherosclerosis (ICD10-I70.0). Electronically Signed   By: Bretta BangWilliam  Woodruff III M.D.   On: 10/21/2018 15:11      IMPRESSION AND PLAN:   Hyperkalemia. The patient will be admitted to medical floor. She is treated with calcium gluconate, bicarb IV, Lokelma, D50 and insulin. Follow-up potassium level.  Acute renal failure on CKD stage III.  IV fluid support and follow-up BMP.  Follow-up nephrology consult. old Lasix, spironolactone and Vasotec. UTI.  Continue Rocephin, follow-up urine culture. COPD.  Stable. Hypertension.  Continue home hypertension medication. Chronic CHF.  Unknown type.  Stable.  Hold Lasix, spironolactone and Vasotec. Diabetes.  Start sliding scale.  I called the patient's daughter, but nobody answered the phone.   All the records are reviewed and case discussed with ED  provider. Management plans discussed with the patient, family and they are in agreement.  CODE STATUS: Full code for now.  TOTAL TIME TAKING CARE OF THIS PATIENT: 43 minutes.    Shaune PollackQing Jameka Ivie M.D on 10/21/2018 at 3:38 PM  Between 7am to 6pm - Pager - [phone]  After 6pm go to [email or web address] - Scientist, research (life sciences)password [email or web address] [email or web address]  [employer] [facility]  Office  [phone]  CC: Primary care physician; [facility]   Note: This dictation was prepared with Dragon dictation along with smaller Lobbyistphrase technology. Any transcriptional errors that result from this process are unin

## 2018-10-21 NOTE — ED Triage Notes (Signed)
Pt arrives via EMS for altered mental status as reported by her family. Family reports she has been weak on one side (negative stroke screen by EMS), and experiencing hallucinations. Family states her symptoms have been present for 2 days. Pt answers my questions appropriately and denies seeing or hearing things that are not there. Pt reports she lives with her daughter [name] [phone]

## 2018-10-21 NOTE — ED Provider Notes (Addendum)
[facility] Emergency Department Provider Note    First MD Initiated Contact with Patient 10/21/18 1313     (approximate)  I have reviewed the triage vital signs and the nursing notes.   HISTORY  Chief Complaint Altered Mental Status  Level V Caveat:  AMS  HPI Laurie Ross is a 78 y.o. female since past medical history as listed below  presents the ER for evaluation of altered mental status and confusion.  Patient unable to provide much additional history.  She is awake but alert and disoriented.  No reported fevers.  Daughter reports that last time she was seen normal was 2 days ago and since then developed weakness.  Has had some decreased oral intake.  No new medication changes.  States that 1 time that she did fall down onto her right side did not hit her head.  Also concerned because she was seeing her mother and like she was having hallucinations.   Past Medical History:  Diagnosis Date   Anemia    Arthritis    Asthma    CHF (congestive heart failure) (HCC)    Chronic kidney disease    stage III   COPD (chronic obstructive pulmonary disease) (HCC)    occasional home O2 use   Depression    Diabetes mellitus without complication (HCC)    Dysrhythmia    bradycardia   GERD (gastroesophageal reflux disease)    Glaucoma    Hx of Clostridium difficile infection March 2016   Hyperlipidemia    Hypertension    Hypothyroidism    Obstructive sleep apnea    Sciatica    Venous insufficiency    Family History  Problem Relation Age of Onset   Breast cancer Mother    Heart disease Father    Past Surgical History:  Procedure Laterality Date   BREAST BIOPSY     CATARACT EXTRACTION W/PHACO Right 03/23/2016   Procedure: CATARACT EXTRACTION PHACO AND INTRAOCULAR LENS PLACEMENT (IOC);  Surgeon: Lockie Molahadwick Brasington, MD;  [facility];  Service: Ophthalmology;  Laterality: Right;  DIABETIC - insulin and oral  meds PLEASE LEAVE PT 2ND   Patient Active Problem List   Diagnosis Date Noted   Sepsis (HCC) 11/25/2015   Diarrhea 09/24/2014      Prior to Admission medications   Medication Sig Start Date End Date Taking? Authorizing Provider  acetaminophen (TYLENOL) 325 MG tablet Take 2 tablets (650 mg total) by mouth every 6 (six) hours as needed for mild pain (or Fever >/= 101). Patient taking differently: Take 650 mg by mouth 2 (two) times daily.  09/28/14   Gouru, Deanna ArtisAruna, MD  albuterol (PROVENTIL) (2.5 MG/3ML) 0.083% nebulizer solution Take 2.5 mg by nebulization every 6 (six) hours as needed for wheezing or shortness of breath.    [provider]  amLODipine (NORVASC) 5 MG tablet Take 5 mg by mouth daily.    [provider]  amLODipine-atorvastatin (CADUET) 5-10 MG per tablet Take 1 tablet by mouth at bedtime.    [provider]  aspirin EC 81 MG tablet Take 81 mg by mouth daily.    [provider]  atenolol (TENORMIN) 50 MG tablet Take 50 mg by mouth daily.    [provider]  budesonide-formoterol (SYMBICORT) 160-4.5 MCG/ACT inhaler Inhale 2 puffs into the lungs 2 (two) times daily.    [provider]  calcium carbonate (TUMS - DOSED IN MG ELEMENTAL CALCIUM) 500 MG chewable tablet Chew 1 tablet by mouth  2 (two) times daily.    [provider]  CALCIUM-VITAMIN D PO Take 1 tablet by mouth 2 (two) times daily. osteoporosis    [provider]  cetirizine (ZYRTEC) 10 MG tablet Take 5 mg by mouth daily.     [provider]  clotrimazole (LOTRIMIN) 1 % cream Apply 1 application topically 2 (two) times daily. Between toes    [provider]  docusate sodium (COLACE) 100 MG capsule Take 100 mg by mouth daily.    [provider]  dorzolamide-timolol (COSOPT) 22.3-6.8 MG/ML ophthalmic solution Place 1 drop into both eyes 2 (two) times daily.    [provider]  enalapril (VASOTEC) 20 MG tablet Take 20  mg by mouth daily.     [provider]  ferrous sulfate 325 (65 FE) MG tablet Take 325 mg by mouth at bedtime.    [provider]  fluticasone (FLONASE) 50 MCG/ACT nasal spray Place 2 sprays into both nostrils daily.    [provider]  furosemide (LASIX) 80 MG tablet Take 0.5 tablets (40 mg total) by mouth daily. 11/27/15   Adrian Saran, MD  glucose 4 GM chewable tablet Chew 1 tablet by mouth as needed for low blood sugar.    [provider]  hydrocortisone (ANUSOL-HC) 2.5 % rectal cream Place 1 application rectally 3 (three) times daily as needed for hemorrhoids or itching.    [provider]  hydrocortisone cream 1 % Apply 1 application topically 3 (three) times daily as needed for itching.    [provider]  insulin glargine (LANTUS) 100 UNIT/ML injection Inject 10 Units into the skin at bedtime.    [provider]  insulin NPH-regular Human (NOVOLIN 70/30) (70-30) 100 UNIT/ML injection Inject 34 Units into the skin 2 (two) times daily with a meal. 30 units in the am. 12 units in the pm.    [provider]  latanoprost (XALATAN) 0.005 % ophthalmic solution Place 1 drop into both eyes at bedtime.    [provider]  levothyroxine (SYNTHROID, LEVOTHROID) 50 MCG tablet Take 50 mcg by mouth daily.    [provider]  loperamide (IMODIUM) 2 MG capsule Take 2 mg by mouth as needed for diarrhea or loose stools.    [provider]  magnesium hydroxide (MILK OF MAGNESIA) 400 MG/5ML suspension Take 15 mLs by mouth 4 (four) times daily.    [provider]  metoCLOPramide (REGLAN) 5 MG tablet Take 5 mg by mouth 3 (three) times daily before meals.    [provider]  OXYGEN Inhale 2 L into the lungs as needed.    [provider]  psyllium (HYDROCIL/METAMUCIL) 95 % PACK Take 1 packet by mouth 2 (two) times daily.    [provider]  ranitidine (ZANTAC) 150 MG tablet Take 150 mg  by mouth 2 (two) times daily.    [provider]  sertraline (ZOLOFT) 100 MG tablet Take 150 mg by mouth daily.    [provider]  spironolactone (ALDACTONE) 25 MG tablet Take 50 mg by mouth daily. For fluid    [provider]  traMADol (ULTRAM) 50 MG tablet Take 25 mg by mouth every 6 (six) hours as needed.    [provider]  traZODone (DESYREL) 100 MG tablet Take 100 mg by mouth at bedtime.    [provider]  trolamine salicylate (ASPERCREME) 10 % cream Apply 1 application topically as needed for muscle pain.    [provider]  Vitamins A & D (VITAMIN A & D) ointment Apply 1 application topically every 12 (twelve) hours.    [provider]    Allergies Codeine, Glucophage [metformin hcl], Loratadine, and Penicillins    Social History Social History   Tobacco Use   Smoking status: Never Smoker   Smokeless tobacco: Never Used  Substance Use Topics   Alcohol use: No   Drug use: No    Review of Systems Patient denies headaches, rhinorrhea, blurry vision, numbness, shortness of breath, chest pain, edema, cough, abdominal pain, nausea, vomiting, diarrhea, dysuria, fevers, rashes or hallucinations unless otherwise stated above in HPI. ____________________________________________   PHYSICAL EXAM:  VITAL SIGNS: Vitals:   10/21/18 1515 10/21/18 1530  BP: (!) 173/94 (!) 149/84  Pulse: 99 96  Resp: 13 14  Temp:    SpO2: 100% 100%    Constitutional: Alert but disoriented x 3 Eyes: Conjunctivae are normal.  Head: Atraumatic. Nose: No congestion/rhinnorhea. Mouth/Throat: Mucous membranes are moist.   Neck: No stridor. Painless ROM.  Cardiovascular: Normal rate, regular rhythm. Grossly normal heart sounds.  Good peripheral circulation. Respiratory: Normal respiratory effort.  No retractions. Lungs CTAB. Gastrointestinal: Soft and nontender. No distention. No abdominal bruits. No CVA tenderness. Genitourinary:  deferred Musculoskeletal: No lower extremity tenderness nor edema.  No joint effusions. Neurologic:  No lateralizing weakness. No gross focal neurologic deficits are appreciated. No facial droop Skin:  Skin is warm, dry and intact. No rash noted. Psychiatric: Mood and affect are normal. Speech and behavior are normal.  ____________________________________________   LABS (all labs ordered are listed, but only abnormal results are displayed)  Results for orders placed or performed during the hospital encounter of 10/21/18 (from the past 24 hour(s))  CBC with Differential/Platelet     Status: Abnormal   Collection Time: 10/21/18  1:46 PM  Result Value Ref Range   WBC 7.7 4.0 - 10.5 K/uL   RBC 3.52 (L) 3.87 - 5.11 MIL/uL   Hemoglobin 11.6 (L) 12.0 - 15.0 g/dL   HCT 16.135.4 (L) 09.636.0 - 04.546.0 %   MCV 100.6 (H) 80.0 - 100.0 fL   MCH 33.0 26.0 - 34.0 pg   MCHC 32.8 30.0 - 36.0 g/dL   RDW 40.914.9 81.111.5 - 91.415.5 %   Platelets 143 (L) 150 - 400 K/uL   nRBC 0.0 0.0 - 0.2 %   Neutrophils Relative % 75 %   Neutro Abs 5.7 1.7 - 7.7 K/uL   Lymphocytes Relative 16 %   Lymphs Abs 1.3 0.7 - 4.0 K/uL   Monocytes Relative 6 %   Monocytes Absolute 0.4 0.1 - 1.0 K/uL   Eosinophils Relative 2 %   Eosinophils Absolute 0.2 0.0 - 0.5 K/uL   Basophils Relative 1 %   Basophils Absolute 0.0 0.0 - 0.1 K/uL   Immature Granulocytes 0 %   Abs Immature Granulocytes 0.03 0.00 - 0.07 K/uL  Comprehensive metabolic panel     Status: Abnormal   Collection Time: 10/21/18  1:46 PM  Result Value Ref Range   Sodium 136 135 - 145 mmol/L   Potassium 6.5 (HH) 3.5 - 5.1 mmol/L   Chloride 114 (H) 98 - 111 mmol/L   CO2 17 (L) 22 - 32 mmol/L   Glucose, Bld 157 (H) 70 - 99 mg/dL   BUN 50 (H) 8 - 23 mg/dL   Creatinine, Ser 7.821.63 (H) 0.44 - 1.00 mg/dL   Calcium 9.7 8.9 - 95.610.3 mg/dL   Total Protein 6.9 6.5 - 8.1 g/dL  Albumin 3.7 3.5 - 5.0 g/dL   AST 22 15 - 41 U/L   ALT 17 0 - 44 U/L   Alkaline Phosphatase 55 38 - 126 U/L    Total Bilirubin 0.6 0.3 - 1.2 mg/dL   GFR calc non Af Amer 30 (L) >60 mL/min   GFR calc Af Amer 34 (L) >60 mL/min   Anion gap 5 5 - 15  Blood gas, venous     Status: Abnormal   Collection Time: 10/21/18  2:16 PM  Result Value Ref Range   pH, Ven 7.23 (L) 7.250 - 7.430   pCO2, Ven 38 (L) 44.0 - 60.0 mmHg   pO2, Ven 35.0 32.0 - 45.0 mmHg   Bicarbonate 15.9 (L) 20.0 - 28.0 mmol/L   Acid-base deficit 10.9 (H) 0.0 - 2.0 mmol/L   O2 Saturation 54.3 %   Patient temperature 37.0    Collection site VEIN    Sample type VEIN   Urinalysis, Routine w reflex microscopic     Status: Abnormal   Collection Time: 10/21/18  2:17 PM  Result Value Ref Range   Color, Urine YELLOW (A) YELLOW   APPearance TURBID (A) CLEAR   Specific Gravity, Urine 1.016 1.005 - 1.030   pH 5.0 5.0 - 8.0   Glucose, UA NEGATIVE NEGATIVE mg/dL   Hgb urine dipstick SMALL (A) NEGATIVE   Bilirubin Urine NEGATIVE NEGATIVE   Ketones, ur NEGATIVE NEGATIVE mg/dL   Protein, ur NEGATIVE NEGATIVE mg/dL   Nitrite NEGATIVE NEGATIVE   Leukocytes,Ua LARGE (A) NEGATIVE   RBC / HPF 6-10 0 - 5 RBC/hpf   WBC, UA >50 (H) 0 - 5 WBC/hpf   Bacteria, UA MANY (A) NONE SEEN   Squamous Epithelial / LPF 0-5 0 - 5   WBC Clumps PRESENT    Mucus PRESENT    Hyaline Casts, UA PRESENT   SARS Coronavirus 2 (CEPHEID- Performed in [facility] lab), Hosp Order     Status: None   Collection Time: 10/21/18  2:53 PM   Specimen: Nasopharyngeal Swab  Result Value Ref Range   SARS Coronavirus 2 NEGATIVE NEGATIVE   ____________________________________________  EKG My review and personal interpretation at Time: 13:04   Indication: ams  Rate: 85  Rhythm: sinus Axis: likely lead reversal Other: probable limb lead reversal, peaked t waves, no stemi ____________________________________________  RADIOLOGY  I personally reviewed all radiographic images ordered to evaluate for the above acute complaints and reviewed radiology reports and findings.   These findings were personally discussed with the patient.  Please see medical record for radiology report.  ____________________________________________   PROCEDURES  Procedure(s) performed:  .Critical Care Performed by: Willy Eddyobinson, Breton Berns, MD Authorized by: Willy Eddyobinson, Yoandri Congrove, MD   Critical care provider statement:    Critical care time (minutes):  35   Critical care time was exclusive of:  Separately billable procedures and treating other patients   Critical care was necessary to treat or prevent imminent or life-threatening deterioration of the following conditions:  Renal failure   Critical care was time spent personally by me on the following activities:  Development of treatment plan with patient or surrogate, discussions with consultants, evaluation of patient's response to treatment, examination of patient, obtaining history from patient or surrogate, ordering and performing treatments and interventions, ordering and review of laboratory studies, ordering and review of radiographic studies, pulse oximetry, re-evaluation of patient's condition and review of old charts      Critical Care performed: yes ____________________________________________   INITIAL IMPRESSION /  ASSESSMENT AND PLAN / ED COURSE  Pertinent labs & imaging results that were available during my care of the patient were reviewed by me and considered in my medical decision making (see chart for details).   DDX: Dehydration, sepsis, pna, uti, hypoglycemia, cva, drug effect, withdrawal, encephalitis   Laurie Ross is a 78 y.o. who presents to the ED with symptoms as described above.  Patient unable to provide much additional history.  She is well-appearing.  Blood will be sent for the by differential.  The patient will be placed on continuous pulse oximetry and telemetry for monitoring.  Laboratory evaluation will be sent to evaluate for the above complaints.     Clinical Course as of Oct 21 1555  Sun Oct 21, 2018  1443 Patient had critically elevated potassium with peak T waves on EKG changes   [PR]    Clinical Course User Index [PR] Merlyn Lot, MD  Case discussed in consultation with Dr. Candiss Norse of nephrology.  The patient was evaluated in Emergency Department today for the symptoms described in the history of present illness. He/she was evaluated in the context of the global COVID-19 pandemic, which necessitated consideration that the patient might be at risk for infection with the SARS-CoV-2 virus that causes COVID-19. Institutional protocols and algorithms that pertain to the evaluation of patients at risk for COVID-19 are in a state of rapid change based on information released by regulatory bodies including the CDC and federal and state organizations. These policies and algorithms were followed during the patient's care in the ED.  As part of my medical decision making, I reviewed the following data within the [place] notes reviewed and incorporated, Labs reviewed, notes from prior ED visits and [place] Controlled Substance Database   ____________________________________________   FINAL CLINICAL IMPRESSION(S) / ED DIAGNOSES  Final diagnoses:  Hyperkalemia  Altered mental status, unspecified altered mental status type  Cystitis      NEW MEDICATIONS STARTED DURING THIS VISIT:  New Prescriptions   No medications on file     Note:  This document was prepared using Dragon voice recognition software and may include unintentional dictation errors.    Merlyn Lot, MD 10/21/18 1454    Merlyn Lot, MD 10/21/18 1531    Merlyn Lot, MD 10/21/18 [phone]

## 2018-10-21 NOTE — ED Notes (Signed)
Pt otf for imaging 

## 2018-10-21 NOTE — ED Notes (Signed)
Lavender and light green top tubes sent to lab 

## 2018-10-22 LAB — CBC
HCT: 32.3 % — ABNORMAL LOW (ref 36.0–46.0)
Hemoglobin: 10.7 g/dL — ABNORMAL LOW (ref 12.0–15.0)
MCH: 33 pg (ref 26.0–34.0)
MCHC: 33.1 g/dL (ref 30.0–36.0)
MCV: 99.7 fL (ref 80.0–100.0)
Platelets: 120 10*3/uL — ABNORMAL LOW (ref 150–400)
RBC: 3.24 MIL/uL — ABNORMAL LOW (ref 3.87–5.11)
RDW: 14.7 % (ref 11.5–15.5)
WBC: 8.1 10*3/uL (ref 4.0–10.5)
nRBC: 0 % (ref 0.0–0.2)

## 2018-10-22 LAB — BASIC METABOLIC PANEL
Anion gap: 7 (ref 5–15)
BUN: 38 mg/dL — ABNORMAL HIGH (ref 8–23)
CO2: 17 mmol/L — ABNORMAL LOW (ref 22–32)
Calcium: 9.2 mg/dL (ref 8.9–10.3)
Chloride: 116 mmol/L — ABNORMAL HIGH (ref 98–111)
Creatinine, Ser: 1.16 mg/dL — ABNORMAL HIGH (ref 0.44–1.00)
GFR calc Af Amer: 52 mL/min — ABNORMAL LOW (ref >60)
GFR calc non Af Amer: 45 mL/min — ABNORMAL LOW (ref >60)
Glucose, Bld: 115 mg/dL — ABNORMAL HIGH (ref 70–99)
Potassium: 5 mmol/L (ref 3.5–5.1)
Sodium: 140 mmol/L (ref 135–145)

## 2018-10-22 LAB — GLUCOSE, CAPILLARY
Glucose-Capillary: 130 mg/dL — ABNORMAL HIGH (ref 70–99)
Glucose-Capillary: 140 mg/dL — ABNORMAL HIGH (ref 70–99)
Glucose-Capillary: 133 mg/dL — ABNORMAL HIGH (ref 70–99)
Glucose-Capillary: 165 mg/dL — ABNORMAL HIGH (ref 70–99)

## 2018-10-22 MED ORDER — ASPIRIN EC 81 MG PO TBEC
81.0000 mg | DELAYED_RELEASE_TABLET | Freq: Every day | ORAL | Status: DC
  Administered 2018-10-22 – 2018-10-23 (×2): 81 mg via ORAL
  Filled 2018-10-22 (×2): qty 1

## 2018-10-22 MED ORDER — LEVOTHYROXINE SODIUM 50 MCG PO TABS
50.0000 ug | ORAL_TABLET | Freq: Every day | ORAL | Status: DC
  Administered 2018-10-22 – 2018-10-23 (×2): 50 ug via ORAL
  Filled 2018-10-22 (×2): qty 1

## 2018-10-22 MED ORDER — LATANOPROST 0.005 % OP SOLN
1.0000 [drp] | Freq: Every day | OPHTHALMIC | Status: DC
  Administered 2018-10-22: 23:00:00 1 [drp] via OPHTHALMIC
  Filled 2018-10-22: qty 2.5

## 2018-10-22 MED ORDER — TRAZODONE HCL 50 MG PO TABS
100.0000 mg | ORAL_TABLET | Freq: Every day | ORAL | Status: DC
  Administered 2018-10-22: 100 mg via ORAL
  Filled 2018-10-22: qty 2

## 2018-10-22 MED ORDER — DORZOLAMIDE HCL-TIMOLOL MAL 2-0.5 % OP SOLN
1.0000 [drp] | Freq: Two times a day (BID) | OPHTHALMIC | Status: DC
  Administered 2018-10-22 – 2018-10-23 (×3): 1 [drp] via OPHTHALMIC
  Filled 2018-10-22: qty 10

## 2018-10-22 MED ORDER — PREDNISOLONE ACETATE 1 % OP SUSP
1.0000 [drp] | Freq: Two times a day (BID) | OPHTHALMIC | Status: DC
  Administered 2018-10-22 – 2018-10-23 (×3): 1 [drp] via OPHTHALMIC
  Filled 2018-10-22: qty 1

## 2018-10-22 MED ORDER — SERTRALINE HCL 50 MG PO TABS
100.0000 mg | ORAL_TABLET | Freq: Every day | ORAL | Status: DC
  Administered 2018-10-22: 100 mg via ORAL
  Filled 2018-10-22: qty 2

## 2018-10-22 MED ORDER — FLUTICASONE PROPIONATE 50 MCG/ACT NA SUSP
2.0000 | Freq: Every day | NASAL | Status: DC
  Administered 2018-10-22 – 2018-10-23 (×2): 2 via NASAL
  Filled 2018-10-22: qty 16

## 2018-10-22 MED ORDER — INSULIN ASPART 100 UNIT/ML ~~LOC~~ SOLN
0.0000 [IU] | Freq: Three times a day (TID) | SUBCUTANEOUS | Status: DC
  Administered 2018-10-22 (×3): 1 [IU] via SUBCUTANEOUS
  Administered 2018-10-23: 3 [IU] via SUBCUTANEOUS
  Administered 2018-10-23: 12:00:00 2 [IU] via SUBCUTANEOUS
  Filled 2018-10-22 (×5): qty 1

## 2018-10-22 MED ORDER — BRIMONIDINE TARTRATE 0.2 % OP SOLN
1.0000 [drp] | Freq: Three times a day (TID) | OPHTHALMIC | Status: DC
  Administered 2018-10-22 – 2018-10-23 (×4): 1 [drp] via OPHTHALMIC
  Filled 2018-10-22: qty 5

## 2018-10-22 MED ORDER — SERTRALINE HCL 50 MG PO TABS
50.0000 mg | ORAL_TABLET | Freq: Every day | ORAL | Status: DC
  Administered 2018-10-22: 23:00:00 50 mg via ORAL
  Filled 2018-10-22: qty 1

## 2018-10-22 MED ORDER — INSULIN ASPART 100 UNIT/ML ~~LOC~~ SOLN: 0.0000 [IU] | Freq: Every day | SUBCUTANEOUS | Status: DC

## 2018-10-22 MED ORDER — BUDESONIDE 0.25 MG/2ML IN SUSP
0.2500 mg | Freq: Two times a day (BID) | RESPIRATORY_TRACT | Status: DC
  Administered 2018-10-22 – 2018-10-23 (×3): 0.25 mg via RESPIRATORY_TRACT
  Filled 2018-10-22 (×3): qty 2

## 2018-10-22 NOTE — Progress Notes (Addendum)
[employer] - [place] [facility]   PATIENT NAME: Laurie Ross    MR#:  [id number]  DATE OF BIRTH:  08/23/1940  SUBJECTIVE:  CHIEF COMPLAINT:   Chief Complaint  Patient presents with  . Altered Mental Status   -Patient came with altered mental status, still pretty confused.  Noted to have acute renal failure and UTI.  REVIEW OF SYSTEMS:  Review of Systems  Constitutional: Positive for malaise/fatigue. Negative for chills and fever.  HENT: Positive for hearing loss. Negative for ear discharge, ear pain and nosebleeds.   Respiratory: Negative for cough, shortness of breath and wheezing.   Cardiovascular: Negative for chest pain and palpitations.  Gastrointestinal: Negative for abdominal pain, constipation, diarrhea, nausea and vomiting.  Genitourinary: Negative for dysuria.  Neurological: Positive for weakness. Negative for dizziness, speech change, focal weakness, seizures and headaches.  Psychiatric/Behavioral: Negative for depression.    DRUG ALLERGIES:   Allergies  Allergen Reactions  . Penicillins Itching    Has patient had a PCN reaction causing immediate rash, facial/tongue/throat swelling, SOB or lightheadedness with hypotension: No Has patient had a PCN reaction causing severe rash involving mucus membranes or skin necrosis: No Has patient had a PCN reaction that required hospitalization No Has patient had a PCN reaction occurring within the last 10 years: No If all of the above answers are "NO", then may proceed with Cephalosporin use.   . Codeine Other (See Comments)    Unknown reaction  . Glucophage [Metformin Hcl] Other (See Comments)    Unknown reaction  . Loratadine Other (See Comments)    Unknown reaction    VITALS:  Blood pressure 136/89, pulse 93, temperature 98.8 F (37.1 C), temperature source Oral, resp. rate 16, height 5\' 1"  (1.549 m), weight 81 kg, SpO2 95 %.  PHYSICAL EXAMINATION:  Physical Exam  GENERAL:  78  y.o.-year-old patient lying in the bed, not in distress EYES: Pupils equal, round, reactive to light and accommodation. No scleral icterus. Extraocular muscles intact.  HEENT: Head atraumatic, normocephalic. Oropharynx and nasopharynx clear.  NECK:  Supple, no jugular venous distention. No thyroid enlargement, no tenderness.  LUNGS: Normal breath sounds bilaterally, no wheezing, rales,rhonchi or crepitation. No use of accessory muscles of respiration. Decreased bibasilar breath sounds CARDIOVASCULAR: S1, S2 normal. No  rubs, or gallops. 3/6 systolic murmur present ABDOMEN: Soft, nontender, nondistended. Bowel sounds present. No organomegaly or mass.  EXTREMITIES: No pedal edema, cyanosis, or clubbing.  NEUROLOGIC: Cranial nerves II through XII are intact. Muscle strength 5/5 in all extremities. Sensation intact. Gait not checked. Global weakness noted PSYCHIATRIC: The patient is alert and oriented x 1-2.  SKIN: No obvious rash, lesion, or ulcer.    LABORATORY PANEL:   CBC Recent Labs  Lab 10/22/18 0526  WBC 8.1  HGB 10.7*  HCT 32.3*  PLT 120*   ------------------------------------------------------------------------------------------------------------------  Chemistries  Recent Labs  Lab 10/21/18 1346 10/22/18 0526  NA 136 140  K 6.5* 5.0  CL 114* 116*  CO2 17* 17*  GLUCOSE 157* 115*  BUN 50* 38*  CREATININE 1.63* 1.16*  CALCIUM 9.7 9.2  AST 22  --   ALT 17  --   ALKPHOS 55  --   BILITOT 0.6  --    ------------------------------------------------------------------------------------------------------------------  Cardiac Enzymes No results for input(s): TROPONINI in the last 168 hours. ------------------------------------------------------------------------------------------------------------------  RADIOLOGY:  Ct Head Wo Contrast  Result Date: 10/21/2018 CLINICAL DATA:  Altered mental status, unilateral weakness EXAM: CT HEAD WITHOUT CONTRAST TECHNIQUE: Contiguous  axial images were obtained from the base of the skull through the vertex without intravenous contrast. COMPARISON:  02/19/2014 FINDINGS: Brain: Periventricular white matter hypodensity. There is more focal hypodensity of the right corona radiata (series 2, image 18), which appears new compared to CT dated 02/19/2014, although is age indeterminate. Vascular: No hyperdense vessel or unexpected calcification. Skull: Normal. Negative for fracture or focal lesion. Sinuses/Orbits: No acute finding. Other: None. IMPRESSION: Small-vessel white matter disease. There is more focal hypodensity of the right corona radiata (series 2, image 18), which appears new compared to CT dated 02/19/2014, although is age indeterminate. Correlate with clinical findings (i.e. Left-sided weakness) and consider MRI to further evaluate for diffusion restricting infarction if suspected. Electronically Signed   By: Eddie Candle M.D.   On: 10/21/2018 14:33   Dg Chest Portable 1 View  Result Date: 10/21/2018 CLINICAL DATA:  Altered mental status.  Hypertension. EXAM: PORTABLE CHEST 1 VIEW COMPARISON:  November 25, 2015 FINDINGS: There is mild scarring in the left mid lung. There is no edema or consolidation. Heart is upper normal in size with pulmonary vascularity normal. No adenopathy. There is aortic atherosclerosis. There is calcification in each carotid artery. No bone lesions. IMPRESSION: Mild scarring left mid lung. No edema or consolidation. Stable cardiac silhouette. There is bilateral carotid artery calcification. Aortic Atherosclerosis (ICD10-I70.0). Electronically Signed   By: Lowella Grip III M.D.   On: 10/21/2018 15:11    EKG:   Orders placed or performed during the hospital encounter of 10/21/18  . ED EKG  . ED EKG    ASSESSMENT AND PLAN:   78 y/o F with PMH significant for diastolic CHF, CKD stage III, COPD, hypertension, diabetes, hypothyroidism, sleep apnea and pulmonary hypertension brought in from home due to  AMS.  1. Altered mental status-acute metabolic encephalopathy -likely secondary to UTI -f/u urine cultures - on rocephin  2. Hyperkalemia- enalapril on hold, receiving IV fluids- improving - monitor for now  3. Diabetes- lantus and SSI  4. Depression- on zoloft  5. DVT prophylaxis- SQ heparin  PT recommended home health at discharge  Updated daughter over the phone today.   All the records are reviewed and case discussed with Care Management/Social Workerr. Management plans discussed with the patient, family and they are in agreement.  CODE STATUS: Full Code  TOTAL TIME TAKING CARE OF THIS PATIENT: 38 minutes.   POSSIBLE D/C IN 2 DAYS, DEPENDING ON CLINICAL CONDITION.   Gladstone Lighter M.D on 10/22/2018 at 12:29 PM  Between 7am to 6pm - Pager - [phone]  After 6pm go to [email or web address] - password [email or web address] [email or web address] [facility]  Office  [phone]  CC: Primary care physician; [place]

## 2018-10-22 NOTE — Progress Notes (Signed)
Physical Therapy Evaluation Patient Details Name: Laurie Ross MRN: [id number] DOB: 07/03/1940 Today's Date: 10/22/2018   History of Present Illness  Laurie Ross  is a 78 y.o. female with a known history of anemia, CHF, CKD stage III, COPD, hypertension, diabetes, hypothyroidism, OSA and etc.  The patient is sent to ED due to altered mental status. Her daughter reports that she was normal 2 days ago but developed generalized weakness and confusion for the past 2 days.  She is found to have a UTI, acute renal failure due to dehydration and high potassium at 6.5 in the ED.  She is treated with calcium gluconate, D50 with insulin, bicarb and Lokelma. She is currently admitted for the above issues. Pt is AOx2 at time of PT evaluation so history is supplemented by phone conversation with daughter Laurie Ross.   Clinical Impression  Pt admitted with above diagnosis. Pt currently with functional limitations due to the deficits listed below (see PT Problem List).  Pt is AOx2 at time of PT evaluation. Information supplemented by phone conversation with daughter Laurie Ross. During evaluation she requires modA+1 to scoot toward the EOB. After sitting upright with assist from bed positioning. She moves very slowly and is very weak. She requires minA+1 to assist BLEs when returning to bed as well and +2 to [facility] bed in Trendelenberg position. Pt is able to transfer to standing but has to pull up on walker and requires modA+1 from therapist. In standing she is very unsteady and supports the back of her legs on the bed. She is able to take a few side shuffle steps at EOB but is unsafe to attempt forward ambulation away from bed at this time. Daughter reports that pt hasn't walked in a "very long time." She was able to transfer to/from the wheelchair and bedside commode but over the last week has become too weak to perform transfers with assist from family. Therapy recommends continued 24/7  assistance at home as well as [facility] to improve functional mobility with transfers. Per daughter pt is followed by the [facility] program. Pt will benefit from PT services to address deficits in strength, balance, and mobility in order to return to full function at home.     Follow Up Recommendations Home health PT;Supervision/Assistance - 24 hour    Equipment Recommendations  None recommended by PT    Recommendations for Other Services       Precautions / Restrictions Precautions Precautions: Fall Restrictions Weight Bearing Restrictions: No      Mobility  Bed Mobility Overal bed mobility: Needs Assistance Bed Mobility: Supine to Sit;Sit to Supine     Supine to sit: Mod assist Sit to supine: Min assist   General bed mobility comments: Pt requires modA+1 to scoot toward the EOB. She moves very slowly and is very weak. She requires minA+1 to assist BLEs when returning to bed  Transfers Overall transfer level: Needs assistance Equipment used: Rolling walker (2 wheeled) Transfers: Sit to/from Stand Sit to Stand: Mod assist         General transfer comment: Pt is able to transfer to standing but has to pull up on walker and requires modA+1 from therapist. In standing she is very unsteady and supports the back of her legs on the bed. She is able to take a few side shuffle steps at EOB but is unsafe to attempt forward ambulation away from bed at this time  Ambulation/Gait  Stairs            Wheelchair Mobility    Modified Rankin (Stroke Patients Only)       Balance Overall balance assessment: Needs assistance Sitting-balance support: No upper extremity supported Sitting balance-Leahy Scale: Fair     Standing balance support: Bilateral upper extremity supported Standing balance-Leahy Scale: Poor Standing balance comment: Pt requires min/modA+1 assistance to maintain standing balance with back of legs resting on bed                              Pertinent Vitals/Pain Pain Assessment: Faces Faces Pain Scale: No hurt    Home Living Family/patient expects to be discharged to:: Private residence Living Arrangements: Children Available Help at Discharge: Family;Personal care attendant;Other (Comment)(CNA 5d/wk) Type of Home: Mobile home Home Access: Ramped entrance     Home Layout: One level Home Equipment: Walker - 2 wheels;Cane - single point;Bedside commode;Wheelchair - manual      Prior Function Level of Independence: Needs assistance   Gait / Transfers Assistance Needed: Per daughter pt has not walked "in quite a while." Up until a week ago [facility] but has been getting weaker. Multiple falls reported in the last 12 months  ADL's / Homemaking Assistance Needed: Requires assist by family and caregiver for ADLs/IADLs        Hand Dominance   Dominant Hand: Right    Extremity/Trunk Assessment   Upper Extremity Assessment Upper Extremity Assessment: Generalized weakness    Lower Extremity Assessment Lower Extremity Assessment: Generalized weakness       Communication   Communication: No difficulties  Cognition Arousal/Alertness: Awake/alert Behavior During Therapy: Flat affect Overall Cognitive Status: Impaired/Different from baseline Area of Impairment: Orientation                 Orientation Level: Disoriented to;Time;Situation             General Comments: Per family pt has had intermittent confusion of increasing frequency recently      General Comments      Exercises     Assessment/Plan    PT Assessment Patient needs continued PT services  PT Problem List Decreased strength;Decreased activity tolerance;Decreased balance;Decreased mobility       PT Treatment Interventions DME instruction;Gait training;Functional mobility training;Therapeutic activities;Therapeutic exercise;Neuromuscular re-education;Balance training;Patient/family  education;Cognitive remediation    PT Goals (Current goals can be found in the Care Plan section)  Acute Rehab PT Goals Patient Stated Goal: Return to functional transfers at home PT Goal Formulation: With family Time For Goal Achievement: 11/05/18 Potential to Achieve Goals: Fair    Frequency Min 2X/week   Barriers to discharge        Co-evaluation               AM-PAC PT "6 Clicks" Mobility  Outcome Measure Help needed turning from your back to your side while in a flat bed without using bedrails?: A Little Help needed moving from lying on your back to sitting on the side of a flat bed without using bedrails?: A Lot Help needed moving to and from a bed to a chair (including a wheelchair)?: Total Help needed standing up from a chair using your arms (e.g., wheelchair or bedside chair)?: Total Help needed to walk in hospital room?: Total Help needed climbing 3-5 steps with a railing? : Total 6 Click Score: 9    End of Session  Equipment Utilized During Treatment: Gait belt Activity Tolerance: Patient limited by fatigue Patient left: in bed;with call bell/phone within reach;with bed alarm set Nurse Communication: Other (comment)(Attempted to contact but no answer on phone) PT Visit Diagnosis: Unsteadiness on feet (R26.81);Muscle weakness (generalized) (M62.81);History of falling (Z91.81);Difficulty in walking, not elsewhere classified (R26.2)    Time: 7001-7494 PT Time Calculation (min) (ACUTE ONLY): 25 min   Charges:   PT Evaluation $PT Eval Low Complexity: 1 Low PT Treatments $Therapeutic Activity: 8-22 mins        Lyndel Safe Huprich PT, DPT, GCS   Huprich,Jason 10/22/2018, 9:56 AM

## 2018-10-22 NOTE — TOC Initial Note (Signed)
Transition of Care [facility]) - Initial/Assessment Note    Patient Details  Name: ZAKYA HALABI MRN: [id number] Date of Birth: Nov 30, 1940  Transition of Care [facility]) CM/SW Contact:    , Lenice Llamas Phone Number: [phone]  10/22/2018, 4:14 PM  Clinical Narrative: Clinical Social Worker (CSW) reviewed chart and noted that patient is open to [facility] at home. PT is recommending home health. CSW met with patient to discuss D/C plan. Patient is alert and oriented X4 and was laying in the bed. Per patient she lives with her daughter [name] and has 24/7 supervision. Patient is agreeable to D/C home. CSW spoke with Juliann Pulse at [facility] and made her aware of above. Per Juliann Pulse patient does not walk at baseline and has 24/7 supervision at home. Juliann Pulse is aware of home health PT recommendation. [facility] will make arrangements for PT. CSW will continue to follow and assist as needed.                 Expected Discharge Plan: [place] Barriers to Discharge: Continued Medical Work up   Patient Goals and CMS Choice Patient states their goals for this hospitalization and ongoing recovery are:: To feel better      Expected Discharge Plan and Services Expected Discharge Plan: [place] In-house Referral: ([facility] patient) Discharge Planning Services: CM Consult   Living arrangements for the past 2 months: [place] Expected Discharge Date: 10/23/18                         HH Arranged: PT HH Agency: ([facility]) Date HH Agency Contacted: 10/22/18   Representative spoke with at [place]: Juliann Pulse  Prior Living Arrangements/Services Living arrangements for the past 2 months: [place] Lives with:: Adult Children Patient language and need for interpreter reviewed:: No Do you feel safe going back to the place where you live?: Yes      Need for Family Participation in Patient Care: Yes (Comment) Care giver support system in place?: Yes (comment)   Criminal  Activity/Legal Involvement Pertinent to Current Situation/Hospitalization: No - Comment as needed  Activities of Daily Living Home Assistive Devices/Equipment: None ADL Screening (condition at time of admission) Patient's cognitive ability adequate to safely complete daily activities?: No Is the patient deaf or have difficulty hearing?: Yes Does the patient have difficulty seeing, even when wearing glasses/contacts?: No Does the patient have difficulty concentrating, remembering, or making decisions?: Yes Patient able to express need for assistance with ADLs?: Yes Does the patient have difficulty dressing or bathing?: Yes Independently performs ADLs?: No Communication: Independent Dressing (OT): Dependent Is this a change from baseline?: Pre-admission baseline Grooming: Dependent Is this a change from baseline?: Pre-admission baseline Feeding: Needs assistance Is this a change from baseline?: Pre-admission baseline Bathing: Dependent Is this a change from baseline?: Pre-admission baseline Toileting: Dependent Is this a change from baseline?: Pre-admission baseline In/Out Bed: Dependent Is this a change from baseline?: Pre-admission baseline Walks in Home: Dependent Is this a change from baseline?: Pre-admission baseline Does the patient have difficulty walking or climbing stairs?: Yes Weakness of Legs: Both Weakness of Arms/Hands: Both  Permission Sought/Granted Permission sought to share information with : ([facility]) Permission granted to share information with : Yes, Verbal Permission Granted              Emotional Assessment Appearance:: Appears stated age   Affect (typically observed): Pleasant, Calm Orientation: : Oriented to Self, Oriented  to Place, Oriented to  Time, Oriented to Situation Alcohol / Substance Use: Not Applicable Psych Involvement: No (comment)  Admission diagnosis:  Hyperkalemia [E87.5] Cystitis [N30.90] Altered mental status, unspecified altered  mental status type [R41.82] Patient Active Problem List   Diagnosis Date Noted  . Hypokalemia 10/21/2018  . Sepsis ([place]) 11/25/2015  . Diarrhea 09/24/2014   PCP:  [facility]:   [place], [address] [place] [address] Phone: [phone] Fax: [phone]     Social Determinants of Health (SDOH) Interventions    Readmission Risk Interventions No flowsheet data found.

## 2018-10-22 NOTE — TOC Progression Note (Signed)
Transition of Care [facility]) - Progression Note    Patient Details  Name: Laurie Ross MRN: [id number] Date of Birth: June 02, 1940  [facility]) CM/SW Contact  Dunya Meiners, Lenice Llamas Phone Number: [phone]  10/22/2018, 9:24 AM  Clinical Narrative:  Patient is a [facility] participant. Clinical Education officer, museum (CSW) contacted [facility] and made them aware of patient's admission to the hospital. CSW will continue to follow and assist as needed.           Expected Discharge Plan and Services           Expected Discharge Date: 10/23/18                                     Social Determinants of Health (SDOH) Interventions    Readmission Risk Interventions No flowsheet data found.

## 2018-10-23 LAB — BASIC METABOLIC PANEL
Anion gap: 7 (ref 5–15)
BUN: 29 mg/dL — ABNORMAL HIGH (ref 8–23)
CO2: 15 mmol/L — ABNORMAL LOW (ref 22–32)
Calcium: 8.3 mg/dL — ABNORMAL LOW (ref 8.9–10.3)
Chloride: 116 mmol/L — ABNORMAL HIGH (ref 98–111)
Creatinine, Ser: 1.06 mg/dL — ABNORMAL HIGH (ref 0.44–1.00)
GFR calc Af Amer: 58 mL/min — ABNORMAL LOW (ref >60)
GFR calc non Af Amer: 50 mL/min — ABNORMAL LOW (ref >60)
Glucose, Bld: 214 mg/dL — ABNORMAL HIGH (ref 70–99)
Potassium: 5.2 mmol/L — ABNORMAL HIGH (ref 3.5–5.1)
Sodium: 138 mmol/L (ref 135–145)

## 2018-10-23 LAB — GLUCOSE, CAPILLARY
Glucose-Capillary: 204 mg/dL — ABNORMAL HIGH (ref 70–99)
Glucose-Capillary: 175 mg/dL — ABNORMAL HIGH (ref 70–99)

## 2018-10-23 LAB — HEMOGLOBIN A1C
Hgb A1c MFr Bld: 6.6 % — ABNORMAL HIGH (ref 4.8–5.6)
Mean Plasma Glucose: 143 mg/dL

## 2018-10-23 MED ORDER — INSULIN GLARGINE 100 UNIT/ML ~~LOC~~ SOLN: 15.0000 [IU] | Freq: Every day | SUBCUTANEOUS | 11 refills | Status: DC

## 2018-10-23 MED ORDER — AMLODIPINE BESYLATE 5 MG PO TABS: 5.0000 mg | ORAL_TABLET | Freq: Every day | ORAL | 2 refills | Status: DC

## 2018-10-23 MED ORDER — CEPHALEXIN 500 MG PO CAPS: 500.0000 mg | ORAL_CAPSULE | Freq: Three times a day (TID) | ORAL | 0 refills | Status: AC

## 2018-10-23 MED ORDER — SODIUM ZIRCONIUM CYCLOSILICATE 5 G PO PACK
5.0000 g | PACK | Freq: Once | ORAL | Status: AC
  Administered 2018-10-23: 11:00:00 5 g via ORAL
  Filled 2018-10-23: qty 1

## 2018-10-23 NOTE — Progress Notes (Signed)
Patient ready for discharge. Awaiting [facility] to transport. Madlyn Frankel, RN

## 2018-10-23 NOTE — TOC Transition Note (Signed)
Transition of Care [facility]) - CM/SW Discharge Note   Patient Details  Name: Laurie Ross MRN: [id number] Date of Birth: 03/02/1941  [facility]) CM/SW Contact:  Shade Flood, LCSW Phone Number: 10/23/2018, 3:07 PM   Clinical Narrative:     Pt stable for dc today after all per MD. Updated Juliann Pulse at [facility]. She will arrange for transport for pt and she will arrange pt's [facility] therapy and RN as well as follow up with medical care providers. There are no other TOC needs for dc.  Final next level of care: [place] Barriers to Discharge: Barriers Resolved   Patient Goals and CMS Choice Patient states their goals for this hospitalization and ongoing recovery are:: To feel better      Discharge Placement                       Discharge Plan and Services In-house Referral: ([facility] patient) Discharge Planning Services: CM Consult                      HH Arranged: PT [place] Agency: ([facility]) Date HH Agency Contacted: 10/22/18   Representative spoke with at [place]: [place]) Interventions     Readmission Risk Interventions Readmission Risk Prevention Plan 10/23/2018  Transportation Screening Complete  PCP or Specialist Appt within 3-5 Days Complete  HRI or [place] Complete  Social Work Consult for [place] Planning/Counseling Complete  Palliative Care Screening Not Applicable  Medication Review Press photographer) Complete  Some recent data might be hidden

## 2018-10-23 NOTE — Progress Notes (Signed)
[name] NAME: Laurie Ross    MR#:  [id number]  DATE OF BIRTH:  August 15, 1940  SUBJECTIVE:  CHIEF COMPLAINT:   Chief Complaint  Patient presents with  . Altered Mental Status   -Patient  More alert today, potassium elevated - appears very weak  REVIEW OF SYSTEMS:  Review of Systems  Constitutional: Positive for malaise/fatigue. Negative for chills and fever.  HENT: Positive for hearing loss. Negative for ear discharge, ear pain and nosebleeds.   Respiratory: Negative for cough, shortness of breath and wheezing.   Cardiovascular: Negative for chest pain and palpitations.  Gastrointestinal: Negative for abdominal pain, constipation, diarrhea, nausea and vomiting.  Genitourinary: Negative for dysuria.  Neurological: Positive for weakness. Negative for dizziness, speech change, focal weakness, seizures and headaches.  Psychiatric/Behavioral: Negative for depression.    DRUG ALLERGIES:   Allergies  Allergen Reactions  . Penicillins Itching    Has patient had a PCN reaction causing immediate rash, facial/tongue/throat swelling, SOB or lightheadedness with hypotension: No Has patient had a PCN reaction causing severe rash involving mucus membranes or skin necrosis: No Has patient had a PCN reaction that required hospitalization No Has patient had a PCN reaction occurring within the last 10 years: No If all of the above answers are "NO", then may proceed with Cephalosporin use.   . Codeine Other (See Comments)    Unknown reaction  . Glucophage [Metformin Hcl] Other (See Comments)    Unknown reaction  . Loratadine Other (See Comments)    Unknown reaction    VITALS:  Blood pressure 129/61, pulse 89, temperature 98.2 F (36.8 C), temperature source Oral, resp. rate 20, height 5\' 1"  (1.549 m), weight 81 kg, SpO2 97 %.  PHYSICAL EXAMINATION:  Physical Exam  GENERAL:  78 y.o.-year-old patient lying in the bed, not in  distress EYES: Pupils equal, round, reactive to light and accommodation. No scleral icterus. Extraocular muscles intact.  HEENT: Head atraumatic, normocephalic. Oropharynx and nasopharynx clear.  NECK:  Supple, no jugular venous distention. No thyroid enlargement, no tenderness.  LUNGS: Normal breath sounds bilaterally, no wheezing, rales,rhonchi or crepitation. No use of accessory muscles of respiration. Decreased bibasilar breath sounds CARDIOVASCULAR: S1, S2 normal. No  rubs, or gallops. 3/6 systolic murmur present ABDOMEN: Soft, nontender, nondistended. Bowel sounds present. No organomegaly or mass.  EXTREMITIES: No pedal edema, cyanosis, or clubbing.  NEUROLOGIC: Cranial nerves II through XII are intact. Muscle strength 5/5 in all extremities. Sensation intact. Gait not checked. Global weakness noted PSYCHIATRIC: The patient is alert and oriented x  2.  SKIN: No obvious rash, lesion, or ulcer.    LABORATORY PANEL:   CBC Recent Labs  Lab 10/22/18 0526  WBC 8.1  HGB 10.7*  HCT 32.3*  PLT 120*   ------------------------------------------------------------------------------------------------------------------  Chemistries  Recent Labs  Lab 10/21/18 1346  10/23/18 0724  NA 136   < > 138  K 6.5*   < > 5.2*  CL 114*   < > 116*  CO2 17*   < > 15*  GLUCOSE 157*   < > 214*  BUN 50*   < > 29*  CREATININE 1.63*   < > 1.06*  CALCIUM 9.7   < > 8.3*  AST 22  --   --   ALT 17  --   --   ALKPHOS 55  --   --   BILITOT 0.6  --   --    < > =  values in this interval not displayed.   ------------------------------------------------------------------------------------------------------------------  Cardiac Enzymes No results for input(s): TROPONINI in the last 168 hours. ------------------------------------------------------------------------------------------------------------------  RADIOLOGY:  Ct Head Wo Contrast  Result Date: 10/21/2018 CLINICAL DATA:  Altered mental status,  unilateral weakness EXAM: CT HEAD WITHOUT CONTRAST TECHNIQUE: Contiguous axial images were obtained from the base of the skull through the vertex without intravenous contrast. COMPARISON:  02/19/2014 FINDINGS: Brain: Periventricular white matter hypodensity. There is more focal hypodensity of the right corona radiata (series 2, image 18), which appears new compared to CT dated 02/19/2014, although is age indeterminate. Vascular: No hyperdense vessel or unexpected calcification. Skull: Normal. Negative for fracture or focal lesion. Sinuses/Orbits: No acute finding. Other: None. IMPRESSION: Small-vessel white matter disease. There is more focal hypodensity of the right corona radiata (series 2, image 18), which appears new compared to CT dated 02/19/2014, although is age indeterminate. Correlate with clinical findings (i.e. Left-sided weakness) and consider MRI to further evaluate for diffusion restricting infarction if suspected. Electronically Signed   By: Lauralyn PrimesAlex  Bibbey M.D.   On: 10/21/2018 14:33   Dg Chest Portable 1 View  Result Date: 10/21/2018 CLINICAL DATA:  Altered mental status.  Hypertension. EXAM: PORTABLE CHEST 1 VIEW COMPARISON:  November 25, 2015 FINDINGS: There is mild scarring in the left mid lung. There is no edema or consolidation. Heart is upper normal in size with pulmonary vascularity normal. No adenopathy. There is aortic atherosclerosis. There is calcification in each carotid artery. No bone lesions. IMPRESSION: Mild scarring left mid lung. No edema or consolidation. Stable cardiac silhouette. There is bilateral carotid artery calcification. Aortic Atherosclerosis (ICD10-I70.0). Electronically Signed   By: Bretta BangWilliam  Woodruff III M.D.   On: 10/21/2018 15:11    EKG:   Orders placed or performed during the hospital encounter of 10/21/18  . ED EKG  . ED EKG    ASSESSMENT AND PLAN:   78 y/o F with PMH significant for diastolic CHF, CKD stage III, COPD, hypertension, diabetes,  hypothyroidism, sleep apnea and pulmonary hypertension brought in from home due to AMS.  1. Altered mental status-acute metabolic encephalopathy -likely secondary to UTI - urine cultures growing gram negative rods - on rocephin- likely keflex at discharge tomorrow  2. Hyperkalemia- enalapril on hold, still at 5.2 - give lokelma today - monitor for now  3. Diabetes- lantus and SSI  4. Depression- on zoloft  5. DVT prophylaxis- SQ heparin  PT recommended home health at discharge  Discharge tomorrow   All the records are reviewed and case discussed with Care Management/Social Workerr. Management plans discussed with the patient, family and they are in agreement.  CODE STATUS: Full Code  TOTAL TIME TAKING CARE OF THIS PATIENT: 35 minutes.   POSSIBLE D/C IN 1 DAY , DEPENDING ON CLINICAL CONDITION.   Enid Baasadhika Antonin Meininger M.D on 10/23/2018 at 11:40 AM  Between 7am to 6pm - Pager - [phone]  After 6pm go to [email or web address] - Social research officer, governmentpassword EPAS [facility]  [facility]  Office  [phone]  CC: Primary care physician; [facility] [employer]

## 2018-10-23 NOTE — Discharge Summary (Signed)
[facility] [place] [facility]   PATIENT NAME: Laurie Ross    MR#:  [id number]  DATE OF BIRTH:  11/24/1940  DATE OF ADMISSION:  10/21/2018   ADMITTING PHYSICIAN: Shaune PollackQing Chen, MD  DATE OF DISCHARGE:  10/23/18  PRIMARY CARE PH[facility]IC[facility]: [facility]   ADMISSION DIAGNOSIS:   Hyperkalemia [E87.5] Cystitis [N30.90] Altered mental status, unspecified altered mental status type [R41.82]  DISCHARGE DIAGNOSIS:   Active Problems:   Hypokalemia   SECONDARY DIAGNOSIS:   Past Medical History:  Diagnosis Date  . Anemia   . Arthritis   . Asthma   . CHF (congestive heart failure) (HCC)   . Chronic kidney disease    stage III  . COPD (chronic obstructive pulmonary disease) (HCC)    occasional home O2 use  . Depression   . Diabetes mellitus without complication (HCC)   . Dysrhythmia    bradycardia  . GERD (gastroesophageal reflux disease)   . Glaucoma   . Hx of Clostridium difficile infection March 2016  . Hyperlipidemia   . Hypertension   . Hypothyroidism   . Obstructive sleep apnea   . Sciatica   . Venous insufficiency     HOSPITAL COURSE:   78 y/o F with PMH significant for diastolic CHF, CKD stage III, COPD, hypertension, diabetes, hypothyroidism, sleep apnea and pulmonary hypertension brought in from home due to AMS.  1. Altered mental status-acute metabolic encephalopathy - secondary to UTI.  Has some baseline confusion and occasional hallucinations according to PCP. -Urine cultures growing gram-negative rods.  On Rocephin here, will discharge on Keflex  2. Hyperkalemia- enalapril and Aldactone discontinued at discharge.  Potassium at 5.2, received Lokelma prior to discharge. -Outpatient labs within 3 to 4 days.  3. Diabetes-restarted Lantus from home, dose reduced-please follow-up as outpatient   4. Depression- on zoloft and trazodone  Family updated.  Patient will be discharged home with home health.  [facility]   DISCHARGE CONDITIONS:   Guarded  CONSULTS OBTAINED:   None  DRUG ALLERGIES:   Allergies  Allergen Reactions  . Penicillins Itching    Has patient had a PCN reaction causing immediate rash, facial/tongue/throat swelling, SOB or lightheadedness with hypotension: No Has patient had a PCN reaction causing severe rash involving mucus membranes or skin necrosis: No Has patient had a PCN reaction that required hospitalization No Has patient had a PCN reaction occurring within the last 10 years: No If all of the above answers are "NO", then may proceed with Cephalosporin use.   . Codeine Other (See Comments)    Unknown reaction  . Glucophage [Metformin Hcl] Other (See Comments)    Unknown reaction  . Loratadine Other (See Comments)    Unknown reaction   DISCHARGE MEDICATIONS:   Allergies as of 10/23/2018      Reactions   Penicillins Itching   Has patient had a PCN reaction causing immediate rash, facial/tongue/throat swelling, SOB or lightheadedness with hypotension: No Has patient had a PCN reaction causing severe rash involving mucus membranes or skin necrosis: No Has patient had a PCN reaction that required hospitalization No Has patient had a PCN reaction occurring within the last 10 years: No If all of the above answers are "NO", then may proceed with Cephalosporin use.   Codeine Other (See Comments)   Unknown reaction   Glucophage [metformin Hcl] Other (See Comments)   Unknown reaction   Loratadine Other (See Comments)   Unknown reaction  Medication List    STOP taking these medications   enalapril 20 MG tablet Commonly known as: VASOTEC   spironolactone 25 MG tablet Commonly known as: ALDACTONE     TAKE these medications   acetaminophen 650 MG CR tablet Commonly known as: TYLENOL Take 1,300 mg by mouth 2 (two) times a day. What changed: Another medication with the same name was removed. Continue taking this medication, and follow the  directions you see here.   albuterol (2.5 MG/3ML) 0.083% nebulizer solution Commonly known as: PROVENTIL Take 2.5 mg by nebulization every 4 (four) hours as needed for wheezing or shortness of breath.   amLODipine 5 MG tablet Commonly known as: NORVASC Take 1 tablet (5 mg total) by mouth daily. What changed:   medication strength  how much to take   aspirin EC 81 MG tablet Take 81 mg by mouth daily.   brimonidine 0.2 % ophthalmic solution Commonly known as: ALPHAGAN Place 1 drop into the right eye 3 (three) times daily.   budesonide 0.25 MG/2ML nebulizer solution Commonly known as: PULMICORT Take 0.25 mg by nebulization 2 (two) times daily.   Calcium Carb-Cholecalciferol 600-400 MG-UNIT Tabs Take 1 tablet by mouth 2 (two) times a day.   calcium carbonate 500 MG chewable tablet Commonly known as: TUMS - dosed in mg elemental calcium Chew 1 tablet by mouth every 6 (six) hours as needed for indigestion or heartburn.   cephALEXin 500 MG capsule Commonly known as: KEFLEX Take 1 capsule (500 mg total) by mouth 3 (three) times daily for 7 days.   cetirizine 5 MG tablet Commonly known as: ZYRTEC Take 5 mg by mouth at bedtime.   docusate sodium 100 MG capsule Commonly known as: COLACE Take 100 mg by mouth daily.   dorzolamide-timolol 22.3-6.8 MG/ML ophthalmic solution Commonly known as: COSOPT Place 1 drop into both eyes 2 (two) times daily.   ferrous sulfate 325 (65 FE) MG tablet Take 325 mg by mouth every Monday, Wednesday, and Friday.   fluticasone 50 MCG/ACT nasal spray Commonly known as: FLONASE Place 2 sprays into both nostrils daily.   glucose 4 GM chewable tablet Chew 1 tablet by mouth as needed for low blood sugar.   insulin glargine 100 UNIT/ML injection Commonly known as: LANTUS Inject 0.15 mLs (15 Units total) into the skin at bedtime. What changed: how much to take   latanoprost 0.005 % ophthalmic solution Commonly known as: XALATAN Place 1 drop  into both eyes at bedtime.   levothyroxine 50 MCG tablet Commonly known as: SYNTHROID Take 50 mcg by mouth daily.   loperamide 2 MG capsule Commonly known as: IMODIUM Take 2 mg by mouth as needed for diarrhea or loose stools (max 4 capsules in 24 hours).   omeprazole 20 MG capsule Commonly known as: PRILOSEC Take 20 mg by mouth daily.   ondansetron 4 MG tablet Commonly known as: ZOFRAN Take 4 mg by mouth every 8 (eight) hours as needed for nausea or vomiting.   prednisoLONE acetate 1 % ophthalmic suspension Commonly known as: PRED FORTE Place 1 drop into the right eye 2 (two) times a day.   predniSONE 20 MG tablet Commonly known as: DELTASONE Take 20 mg by mouth 3 (three) times daily as needed (breathing difficulty).   sertraline 100 MG tablet Commonly known as: ZOLOFT Take 100 mg by mouth at bedtime.   sertraline 50 MG tablet Commonly known as: ZOLOFT Take 50 mg by mouth at bedtime.   traZODone 100 MG tablet Commonly  known as: DESYREL Take 100 mg by mouth at bedtime.   valACYclovir 500 MG tablet Commonly known as: VALTREX Take 500 mg by mouth daily.   vitamin A & D ointment Apply 1 application topically 4 (four) times daily as needed for dry skin.   Zinc Oxide 13 % Crea Apply 1 application topically 3 (three) times daily. After toileting        DISCHARGE INSTRUCTIONS:   1. BMP check in 3-4 days 2. PCP f/u in 1-2 weeks  DIET:   Cardiac diet  ACTIVITY:   Activity as tolerated  OXYGEN:   Home Oxygen: No.  Oxygen Delivery: room air  DISCHARGE LOCATION:   home   If you experience worsening of your admission symptoms, develop shortness of breath, life threatening emergency, suicidal or homicidal thoughts you must seek medical attention immediately by calling 911 or calling your MD immediately  if symptoms less severe.  You Must read complete instructions/literature along with all the possible adverse reactions/side effects for all the Medicines  you take and that have been prescribed to you. Take any new Medicines after you have completely understood and accpet all the possible adverse reactions/side effects.   Please note  You were cared for by a hospitalist during your hospital stay. If you have any questions about your discharge medications or the care you received while you were in the hospital after you are discharged, you can call the unit and asked to speak with the hospitalist on call if the hospitalist that took care of you is not available. Once you are discharged, your primary care physician will handle any further medical issues. Please note that NO REFILLS for any discharge medications will be authorized once you are discharged, as it is imperative that you return to your primary care physician (or establish a relationship with a primary care physician if you do not have one) for your aftercare needs so that they can reassess your need for medications and monitor your lab values.    On the day of Discharge:  VITAL SIGNS:   Blood pressure 129/61, pulse 89, temperature 98.2 F (36.8 C), temperature source Oral, resp. rate 20, height 5\' 1"  (1.549 m), weight 81 kg, SpO2 97 %.  PHYSICAL EXAMINATION:   GENERAL:  78 y.o.-year-old patient lying in the bed, not in distress EYES: Pupils equal, round, reactive to light and accommodation. No scleral icterus. Extraocular muscles intact.  HEENT: Head atraumatic, normocephalic. Oropharynx and nasopharynx clear.  NECK:  Supple, no jugular venous distention. No thyroid enlargement, no tenderness.  LUNGS: Normal breath sounds bilaterally, no wheezing, rales,rhonchi or crepitation. No use of accessory muscles of respiration. Decreased bibasilar breath sounds CARDIOVASCULAR: S1, S2 normal. No  rubs, or gallops. 3/6 systolic murmur present ABDOMEN: Soft, nontender, nondistended. Bowel sounds present. No organomegaly or mass.  EXTREMITIES: No pedal edema, cyanosis, or clubbing.  NEUROLOGIC:  Cranial nerves II through XII are intact. Muscle strength 5/5 in all extremities. Sensation intact. Gait not checked. Global weakness noted PSYCHIATRIC: The patient is alert and oriented x  2.  SKIN: No obvious rash, lesion, or ulcer.    DATA REVIEW:   CBC Recent Labs  Lab 10/22/18 0526  WBC 8.1  HGB 10.7*  HCT 32.3*  PLT 120*    Chemistries  Recent Labs  Lab 10/21/18 1346  10/23/18 0724  NA 136   < > 138  K 6.5*   < > 5.2*  CL 114*   < > 116*  CO2 17*   < >  15*  GLUCOSE 157*   < > 214*  BUN 50*   < > 29*  CREATININE 1.63*   < > 1.06*  CALCIUM 9.7   < > 8.3*  AST 22  --   --   ALT 17  --   --   ALKPHOS 55  --   --   BILITOT 0.6  --   --    < > = values in this interval not displayed.     Microbiology Results  Results for orders placed or performed during the hospital encounter of 10/21/18  Urine Culture     Status: Abnormal (Preliminary result)   Collection Time: 10/21/18  2:17 PM   Specimen: Urine, Random  Result Value Ref Range Status   Specimen Description   Final    URINE, RANDOM Performed at [facility], [address], [place], [place] [address]    Special Requests   Final    NONE Performed at [facility], [address], [place], [place] [address]    Culture >=100,000 COLONIES/mL GRAM NEGATIVE RODS (A)  Final   Report Status PENDING  Incomplete  SARS Coronavirus 2 (CEPHEID- Performed in [facility] hospital lab), Hosp Order     Status: None   Collection Time: 10/21/18  2:53 PM   Specimen: Nasopharyngeal Swab  Result Value Ref Range Status   SARS Coronavirus 2 NEGATIVE NEGATIVE Final    Comment: (NOTE) If result is NEGATIVE SARS-CoV-2 target nucleic acids are NOT DETECTED. The SARS-CoV-2 RNA is generally detectable in upper and lower  respiratory specimens during the acute phase of infection. The lowest  concentration of SARS-CoV-2 viral copies this assay can detect is 250  copies / mL. A negative result does not preclude  SARS-CoV-2 infection  and should not be used as the sole basis for treatment or other  patient management decisions.  A negative result may occur with  improper specimen collection / handling, submission of specimen other  than nasopharyngeal swab, presence of viral mutation(s) within the  areas targeted by this assay, and inadequate number of viral copies  (<250 copies / mL). A negative result must be combined with clinical  observations, patient history, and epidemiological information. If result is POSITIVE SARS-CoV-2 target nucleic acids are DETECTED. The SARS-CoV-2 RNA is generally detectable in upper and lower  respiratory specimens dur ing the acute phase of infection.  Positive  results are indicative of active infection with SARS-CoV-2.  Clinical  correlation with patient history and other diagnostic information is  necessary to determine patient infection status.  Positive results do  not rule out bacterial infection or co-infection with other viruses. If result is PRESUMPTIVE POSTIVE SARS-CoV-2 nucleic acids MAY BE PRESENT.   A presumptive positive result was obtained on the submitted specimen  and confirmed on repeat testing.  While 2019 novel coronavirus  (SARS-CoV-2) nucleic acids may be present in the submitted sample  additional confirmatory testing may be necessary for epidemiological  and / or clinical management purposes  to differentiate between  SARS-CoV-2 and other Sarbecovirus currently known to infect humans.  If clinically indicated additional testing with an alternate test  methodology [phone]) is advised. The SARS-CoV-2 RNA is generally  detectable in upper and lower respiratory sp ecimens during the acute  phase of infection. The expected result is Negative. Fact Sheet for Patients:  BoilerBrush.com.cy Fact Sheet for Healthcare Providers: https://pope.com/ This test is not yet approved or cleared by the  [place] FDA and has  been authorized for detection and/or diagnosis of SARS-CoV-2 by FDA under an Emergency Use Authorization (EUA).  This EUA will remain in effect (meaning this test can be used) for the duration of the COVID-19 declaration under Section 564(b)(1) of the Act, 21 U.S.C. section 360bbb-3(b)(1), unless the authorization is terminated or revoked sooner. Performed at [facility], [address] [place], [place] [address]     RADIOLOGY:  No results found.   Management plans discussed with the patient, family and they are in agreement.  CODE STATUS:     Code Status Orders  (From admission, onward)         Start     Ordered   10/21/18 1703  Full code  Continuous     10/21/18 1702        Code Status History    Date Active Date Inactive Code Status Order ID Comments User Context   11/25/2015 1200 11/27/2015 1900 DNR [id number]  [place] SirenSainani, Vivek J, MD Inpatient   09/24/2014 1720 09/28/2014 1915 Full Code [id number]  Milagros LollSudini, Srikar, MD ED   Advance Care Planning Activity      TOTAL TIME TAKING CARE OF THIS PATIENT: 38 minutes.    Enid Baasadhika Felita Bump M.D on 10/23/2018 at 1:58 PM  Between 7am to 6pm - Pager - [phone]  After 6pm go to [email or web address] - Scientist, research (life sciences)password [email or web address]  [facility] [place] [facility]  Office  [phone]  CC: Primary care physician; [facility]   Note: This dictation was prepared with Dragon dictation along with smaller Lobbyistphrase technology. Any transcriptional errors that result from this process are unintentional.

## 2018-10-23 NOTE — TOC Progression Note (Signed)
Transition of Care [facility]) - Progression Note    Patient Details  Name: Laurie Ross MRN: [id number] Date of Birth: 01/14/1941  [facility] CM/SW Contact  Shade Flood, LCSW Phone Number: 10/23/2018, 12:24 PM  Clinical Narrative:     TOC following. Pt discussed in Progression today. MD notes indicate she is planning to dc pt tomorrow. Milinda Pointer at [facility]. TOC will follow up tomorrow.  Expected Discharge Plan: [place] Barriers to Discharge: Continued Medical Work up  Expected Discharge Plan and Services Expected Discharge Plan: [place] In-house Referral: ([facility] patient) Discharge Planning Services: CM Consult   Living arrangements for the past 2 months: [place] Expected Discharge Date: 10/23/18                         HH Arranged: PT HH Agency: ([facility]) Date HH Agency Contacted: 10/22/18   Representative spoke with at [place]: [place] ([place]) Interventions    Readmission Risk Interventions No flowsheet data found.

## 2018-10-23 NOTE — Progress Notes (Signed)
Patient discharged with [facility] via wheelchair. Madlyn Frankel, RN

## 2018-10-25 LAB — URINE CULTURE: Culture: >=100000 — AB

## 2019-03-20 ENCOUNTER — Encounter: Payer: Self-pay | Admitting: Emergency Medicine

## 2019-03-20 ENCOUNTER — Other Ambulatory Visit: Payer: Self-pay

## 2019-03-20 ENCOUNTER — Emergency Department: Payer: Medicare (Managed Care)

## 2019-03-20 ENCOUNTER — Observation Stay: Payer: Medicare (Managed Care)

## 2019-03-20 ENCOUNTER — Inpatient Hospital Stay
Admission: EM | Admit: 2019-03-20 | Discharge: 2019-03-26 | DRG: 291 | Disposition: A | Discharge status: Home Health | Payer: Medicare (Managed Care) | Attending: Family Medicine | Admitting: Family Medicine

## 2019-03-20 DIAGNOSIS — L0390 Cellulitis, unspecified: Secondary | ICD-10-CM

## 2019-03-20 DIAGNOSIS — E162 Hypoglycemia, unspecified: Secondary | ICD-10-CM

## 2019-03-20 DIAGNOSIS — L03116 Cellulitis of left lower limb: Secondary | ICD-10-CM | POA: Diagnosis present

## 2019-03-20 DIAGNOSIS — I130 Hypertensive heart and chronic kidney disease with heart failure and stage 1 through stage 4 chronic kidney disease, or unspecified chronic kidney disease: Principal | ICD-10-CM | POA: Diagnosis present

## 2019-03-20 DIAGNOSIS — M79605 Pain in left leg: Secondary | ICD-10-CM

## 2019-03-20 DIAGNOSIS — I248 Other forms of acute ischemic heart disease: Secondary | ICD-10-CM | POA: Diagnosis present

## 2019-03-20 DIAGNOSIS — N179 Acute kidney failure, unspecified: Secondary | ICD-10-CM | POA: Diagnosis present

## 2019-03-20 DIAGNOSIS — F0390 Unspecified dementia without behavioral disturbance: Secondary | ICD-10-CM | POA: Diagnosis present

## 2019-03-20 DIAGNOSIS — R339 Retention of urine, unspecified: Secondary | ICD-10-CM | POA: Diagnosis present

## 2019-03-20 DIAGNOSIS — N1831 Chronic kidney disease, stage 3a: Secondary | ICD-10-CM | POA: Diagnosis present

## 2019-03-20 DIAGNOSIS — Z9981 Dependence on supplemental oxygen: Secondary | ICD-10-CM

## 2019-03-20 DIAGNOSIS — I5033 Acute on chronic diastolic (congestive) heart failure: Secondary | ICD-10-CM | POA: Diagnosis present

## 2019-03-20 DIAGNOSIS — E119 Type 2 diabetes mellitus without complications: Secondary | ICD-10-CM

## 2019-03-20 DIAGNOSIS — R55 Syncope and collapse: Secondary | ICD-10-CM | POA: Diagnosis not present

## 2019-03-20 DIAGNOSIS — R531 Weakness: Secondary | ICD-10-CM | POA: Diagnosis not present

## 2019-03-20 DIAGNOSIS — T383X5A Adverse effect of insulin and oral hypoglycemic [antidiabetic] drugs, initial encounter: Secondary | ICD-10-CM | POA: Diagnosis present

## 2019-03-20 DIAGNOSIS — R6520 Severe sepsis without septic shock: Secondary | ICD-10-CM

## 2019-03-20 DIAGNOSIS — A419 Sepsis, unspecified organism: Secondary | ICD-10-CM | POA: Diagnosis present

## 2019-03-20 DIAGNOSIS — E11649 Type 2 diabetes mellitus with hypoglycemia without coma: Secondary | ICD-10-CM | POA: Diagnosis present

## 2019-03-20 DIAGNOSIS — Z79890 Hormone replacement therapy (postmenopausal): Secondary | ICD-10-CM

## 2019-03-20 DIAGNOSIS — I272 Pulmonary hypertension, unspecified: Secondary | ICD-10-CM | POA: Diagnosis present

## 2019-03-20 DIAGNOSIS — J9600 Acute respiratory failure, unspecified whether with hypoxia or hypercapnia: Secondary | ICD-10-CM

## 2019-03-20 DIAGNOSIS — E876 Hypokalemia: Secondary | ICD-10-CM | POA: Diagnosis present

## 2019-03-20 DIAGNOSIS — E1129 Type 2 diabetes mellitus with other diabetic kidney complication: Secondary | ICD-10-CM | POA: Diagnosis present

## 2019-03-20 DIAGNOSIS — E039 Hypothyroidism, unspecified: Secondary | ICD-10-CM | POA: Diagnosis present

## 2019-03-20 DIAGNOSIS — Z9889 Other specified postprocedural states: Secondary | ICD-10-CM

## 2019-03-20 DIAGNOSIS — E160 Drug-induced hypoglycemia without coma: Secondary | ICD-10-CM | POA: Diagnosis present

## 2019-03-20 DIAGNOSIS — Z8249 Family history of ischemic heart disease and other diseases of the circulatory system: Secondary | ICD-10-CM

## 2019-03-20 DIAGNOSIS — J449 Chronic obstructive pulmonary disease, unspecified: Secondary | ICD-10-CM | POA: Diagnosis present

## 2019-03-20 DIAGNOSIS — G4733 Obstructive sleep apnea (adult) (pediatric): Secondary | ICD-10-CM | POA: Diagnosis present

## 2019-03-20 DIAGNOSIS — D631 Anemia in chronic kidney disease: Secondary | ICD-10-CM | POA: Diagnosis present

## 2019-03-20 DIAGNOSIS — I071 Rheumatic tricuspid insufficiency: Secondary | ICD-10-CM | POA: Diagnosis present

## 2019-03-20 DIAGNOSIS — F329 Major depressive disorder, single episode, unspecified: Secondary | ICD-10-CM | POA: Diagnosis present

## 2019-03-20 DIAGNOSIS — R0602 Shortness of breath: Secondary | ICD-10-CM

## 2019-03-20 DIAGNOSIS — Z794 Long term (current) use of insulin: Secondary | ICD-10-CM

## 2019-03-20 DIAGNOSIS — R34 Anuria and oliguria: Secondary | ICD-10-CM

## 2019-03-20 DIAGNOSIS — H409 Unspecified glaucoma: Secondary | ICD-10-CM | POA: Diagnosis present

## 2019-03-20 DIAGNOSIS — D509 Iron deficiency anemia, unspecified: Secondary | ICD-10-CM | POA: Diagnosis present

## 2019-03-20 DIAGNOSIS — E1122 Type 2 diabetes mellitus with diabetic chronic kidney disease: Secondary | ICD-10-CM | POA: Diagnosis present

## 2019-03-20 DIAGNOSIS — Z20828 Contact with and (suspected) exposure to other viral communicable diseases: Secondary | ICD-10-CM | POA: Diagnosis present

## 2019-03-20 DIAGNOSIS — Z7951 Long term (current) use of inhaled steroids: Secondary | ICD-10-CM

## 2019-03-20 DIAGNOSIS — D696 Thrombocytopenia, unspecified: Secondary | ICD-10-CM | POA: Diagnosis present

## 2019-03-20 DIAGNOSIS — J90 Pleural effusion, not elsewhere classified: Secondary | ICD-10-CM

## 2019-03-20 DIAGNOSIS — Z803 Family history of malignant neoplasm of breast: Secondary | ICD-10-CM

## 2019-03-20 DIAGNOSIS — R630 Anorexia: Secondary | ICD-10-CM | POA: Diagnosis present

## 2019-03-20 DIAGNOSIS — I4891 Unspecified atrial fibrillation: Secondary | ICD-10-CM | POA: Diagnosis present

## 2019-03-20 DIAGNOSIS — Z66 Do not resuscitate: Secondary | ICD-10-CM | POA: Diagnosis present

## 2019-03-20 DIAGNOSIS — Z7982 Long term (current) use of aspirin: Secondary | ICD-10-CM

## 2019-03-20 DIAGNOSIS — Z79899 Other long term (current) drug therapy: Secondary | ICD-10-CM

## 2019-03-20 DIAGNOSIS — J9621 Acute and chronic respiratory failure with hypoxia: Secondary | ICD-10-CM | POA: Diagnosis present

## 2019-03-20 DIAGNOSIS — K219 Gastro-esophageal reflux disease without esophagitis: Secondary | ICD-10-CM | POA: Diagnosis present

## 2019-03-20 DIAGNOSIS — R1310 Dysphagia, unspecified: Secondary | ICD-10-CM | POA: Diagnosis present

## 2019-03-20 DIAGNOSIS — E872 Acidosis: Secondary | ICD-10-CM | POA: Diagnosis present

## 2019-03-20 LAB — CBC
HCT: 40.4 % (ref 36.0–46.0)
Hemoglobin: 13 g/dL (ref 12.0–15.0)
MCH: 32.7 pg (ref 26.0–34.0)
MCHC: 32.2 g/dL (ref 30.0–36.0)
MCV: 101.8 fL — ABNORMAL HIGH (ref 80.0–100.0)
Platelets: 119 10*3/uL — ABNORMAL LOW (ref 150–400)
RBC: 3.97 MIL/uL (ref 3.87–5.11)
RDW: 14.8 % (ref 11.5–15.5)
WBC: 15.4 10*3/uL — ABNORMAL HIGH (ref 4.0–10.5)
nRBC: 0 % (ref 0.0–0.2)

## 2019-03-20 LAB — URINALYSIS, COMPLETE (UACMP) WITH MICROSCOPIC
Bacteria, UA: NONE SEEN
Bilirubin Urine: NEGATIVE
Glucose, UA: NEGATIVE mg/dL
Hgb urine dipstick: NEGATIVE
Ketones, ur: NEGATIVE mg/dL
Leukocytes,Ua: NEGATIVE
Nitrite: NEGATIVE
Protein, ur: 100 mg/dL — AB
Specific Gravity, Urine: 1.021 (ref 1.005–1.030)
pH: 5 (ref 5.0–8.0)

## 2019-03-20 LAB — COMPREHENSIVE METABOLIC PANEL
ALT: 17 U/L (ref 0–44)
AST: 19 U/L (ref 15–41)
Albumin: 3 g/dL — ABNORMAL LOW (ref 3.5–5.0)
Alkaline Phosphatase: 79 U/L (ref 38–126)
Anion gap: 8 (ref 5–15)
BUN: 28 mg/dL — ABNORMAL HIGH (ref 8–23)
CO2: 29 mmol/L (ref 22–32)
Calcium: 8.8 mg/dL — ABNORMAL LOW (ref 8.9–10.3)
Chloride: 105 mmol/L (ref 98–111)
Creatinine, Ser: 1.15 mg/dL — ABNORMAL HIGH (ref 0.44–1.00)
GFR calc Af Amer: 52 mL/min — ABNORMAL LOW (ref >60)
GFR calc non Af Amer: 45 mL/min — ABNORMAL LOW (ref >60)
Glucose, Bld: 70 mg/dL (ref 70–99)
Potassium: 3.2 mmol/L — ABNORMAL LOW (ref 3.5–5.1)
Sodium: 142 mmol/L (ref 135–145)
Total Bilirubin: 0.8 mg/dL (ref 0.3–1.2)
Total Protein: 6.4 g/dL — ABNORMAL LOW (ref 6.5–8.1)

## 2019-03-20 LAB — GLUCOSE, CAPILLARY
Glucose-Capillary: 110 mg/dL — ABNORMAL HIGH (ref 70–99)
Glucose-Capillary: 128 mg/dL — ABNORMAL HIGH (ref 70–99)
Glucose-Capillary: 150 mg/dL — ABNORMAL HIGH (ref 70–99)
Glucose-Capillary: 20 mg/dL — CL (ref 70–99)
Glucose-Capillary: 235 mg/dL — ABNORMAL HIGH (ref 70–99)
Glucose-Capillary: 245 mg/dL — ABNORMAL HIGH (ref 70–99)
Glucose-Capillary: 56 mg/dL — ABNORMAL LOW (ref 70–99)
Glucose-Capillary: 68 mg/dL — ABNORMAL LOW (ref 70–99)
Glucose-Capillary: 83 mg/dL (ref 70–99)
Glucose-Capillary: 91 mg/dL (ref 70–99)

## 2019-03-20 LAB — HEMOGLOBIN A1C
Hgb A1c MFr Bld: 6 % — ABNORMAL HIGH (ref 4.8–5.6)
Mean Plasma Glucose: 125.5 mg/dL

## 2019-03-20 LAB — BRAIN NATRIURETIC PEPTIDE: B Natriuretic Peptide: 453 pg/mL — ABNORMAL HIGH (ref 0.0–100.0)

## 2019-03-20 LAB — PROCALCITONIN: Procalcitonin: <0.1 ng/mL

## 2019-03-20 LAB — TSH: TSH: 3.588 u[IU]/mL (ref 0.350–4.500)

## 2019-03-20 LAB — SARS CORONAVIRUS 2 (TAT 6-24 HRS): SARS Coronavirus 2: NEGATIVE

## 2019-03-20 MED ORDER — CALCIUM CARBONATE ANTACID 500 MG PO CHEW: 1.0000 | CHEWABLE_TABLET | Freq: Four times a day (QID) | ORAL | Status: DC | PRN

## 2019-03-20 MED ORDER — SENNOSIDES-DOCUSATE SODIUM 8.6-50 MG PO TABS: 1.0000 | ORAL_TABLET | Freq: Every evening | ORAL | Status: DC | PRN

## 2019-03-20 MED ORDER — CLINDAMYCIN PHOSPHATE 600 MG/50ML IV SOLN
600.0000 mg | Freq: Once | INTRAVENOUS | Status: AC
  Administered 2019-03-20: 11:00:00 600 mg via INTRAVENOUS
  Filled 2019-03-20: qty 50

## 2019-03-20 MED ORDER — SERTRALINE HCL 50 MG PO TABS
150.0000 mg | ORAL_TABLET | Freq: Every day | ORAL | Status: DC
  Administered 2019-03-20 – 2019-03-25 (×6): 150 mg via ORAL
  Filled 2019-03-20 (×6): qty 3

## 2019-03-20 MED ORDER — FERROUS SULFATE 325 (65 FE) MG PO TABS
325.0000 mg | ORAL_TABLET | ORAL | Status: DC
  Administered 2019-03-22 – 2019-03-25 (×2): 325 mg via ORAL
  Filled 2019-03-20 (×4): qty 1

## 2019-03-20 MED ORDER — FUROSEMIDE 10 MG/ML IJ SOLN
40.0000 mg | Freq: Four times a day (QID) | INTRAMUSCULAR | Status: AC
  Administered 2019-03-20 (×2): 40 mg via INTRAVENOUS
  Filled 2019-03-20: qty 4

## 2019-03-20 MED ORDER — SERTRALINE HCL 50 MG PO TABS: 100.0000 mg | ORAL_TABLET | Freq: Every day | ORAL | Status: DC

## 2019-03-20 MED ORDER — DILTIAZEM HCL ER COATED BEADS 180 MG PO CP24
360.0000 mg | ORAL_CAPSULE | Freq: Every day | ORAL | Status: DC
  Administered 2019-03-21: 360 mg via ORAL
  Filled 2019-03-20 (×2): qty 2

## 2019-03-20 MED ORDER — SODIUM CHLORIDE 0.9 % IV SOLN
1.0000 g | Freq: Every day | INTRAVENOUS | Status: DC
  Administered 2019-03-20 – 2019-03-22 (×3): 1 g via INTRAVENOUS
  Filled 2019-03-20: qty 1
  Filled 2019-03-20: qty 10
  Filled 2019-03-20: qty 1

## 2019-03-20 MED ORDER — ENOXAPARIN SODIUM 40 MG/0.4ML ~~LOC~~ SOLN
40.0000 mg | SUBCUTANEOUS | Status: DC
  Administered 2019-03-20: 40 mg via SUBCUTANEOUS
  Filled 2019-03-20 (×2): qty 0.4

## 2019-03-20 MED ORDER — PANTOPRAZOLE SODIUM 40 MG PO TBEC
40.0000 mg | DELAYED_RELEASE_TABLET | Freq: Every day | ORAL | Status: DC
  Administered 2019-03-21 – 2019-03-26 (×4): 40 mg via ORAL
  Filled 2019-03-20 (×5): qty 1

## 2019-03-20 MED ORDER — ACETAMINOPHEN 650 MG RE SUPP: 650.0000 mg | Freq: Four times a day (QID) | RECTAL | Status: DC | PRN

## 2019-03-20 MED ORDER — ACETAMINOPHEN 325 MG PO TABS: 650.0000 mg | ORAL_TABLET | Freq: Four times a day (QID) | ORAL | Status: DC | PRN

## 2019-03-20 MED ORDER — DOCUSATE SODIUM 100 MG PO CAPS
100.0000 mg | ORAL_CAPSULE | Freq: Every day | ORAL | Status: DC
  Administered 2019-03-21 – 2019-03-26 (×5): 100 mg via ORAL
  Filled 2019-03-20 (×6): qty 1

## 2019-03-20 MED ORDER — ASPIRIN EC 81 MG PO TBEC
81.0000 mg | DELAYED_RELEASE_TABLET | Freq: Every day | ORAL | Status: DC
  Administered 2019-03-21 – 2019-03-25 (×3): 81 mg via ORAL
  Filled 2019-03-20 (×5): qty 1

## 2019-03-20 MED ORDER — LEVOTHYROXINE SODIUM 50 MCG PO TABS
50.0000 ug | ORAL_TABLET | Freq: Every day | ORAL | Status: DC
  Administered 2019-03-21 – 2019-03-26 (×5): 50 ug via ORAL
  Filled 2019-03-20 (×5): qty 1

## 2019-03-20 MED ORDER — LOPERAMIDE HCL 2 MG PO CAPS: 2.0000 mg | ORAL_CAPSULE | ORAL | Status: DC | PRN

## 2019-03-20 MED ORDER — ORAL CARE MOUTH RINSE
15.0000 mL | Freq: Two times a day (BID) | OROMUCOSAL | Status: DC
  Administered 2019-03-20 – 2019-03-26 (×12): 15 mL via OROMUCOSAL

## 2019-03-20 MED ORDER — DEXTROSE 50 % IV SOLN
1.0000 | Freq: Once | INTRAVENOUS | Status: AC
  Administered 2019-03-20: 09:00:00 50 mL via INTRAVENOUS

## 2019-03-20 MED ORDER — VALACYCLOVIR HCL 500 MG PO TABS
500.0000 mg | ORAL_TABLET | Freq: Every day | ORAL | Status: DC
  Administered 2019-03-20 – 2019-03-26 (×6): 500 mg via ORAL
  Filled 2019-03-20 (×8): qty 1

## 2019-03-20 MED ORDER — FUROSEMIDE 10 MG/ML IJ SOLN
INTRAMUSCULAR | Status: AC
  Administered 2019-03-20: 40 mg via INTRAVENOUS
  Filled 2019-03-20: qty 4

## 2019-03-20 MED ORDER — INSULIN ASPART 100 UNIT/ML ~~LOC~~ SOLN
0.0000 [IU] | Freq: Three times a day (TID) | SUBCUTANEOUS | Status: DC
  Administered 2019-03-21: 17:00:00 1 [IU] via SUBCUTANEOUS
  Administered 2019-03-21 – 2019-03-22 (×3): 2 [IU] via SUBCUTANEOUS
  Administered 2019-03-22: 17:00:00 3 [IU] via SUBCUTANEOUS
  Administered 2019-03-22: 1 [IU] via SUBCUTANEOUS
  Filled 2019-03-20 (×6): qty 1

## 2019-03-20 MED ORDER — TRAZODONE HCL 100 MG PO TABS
100.0000 mg | ORAL_TABLET | Freq: Every day | ORAL | Status: DC
  Administered 2019-03-20 – 2019-03-25 (×6): 100 mg via ORAL
  Filled 2019-03-20 (×6): qty 1

## 2019-03-20 MED ORDER — SERTRALINE HCL 50 MG PO TABS: 50.0000 mg | ORAL_TABLET | Freq: Every day | ORAL | Status: DC

## 2019-03-20 MED ORDER — DEXTROSE 50 % IV SOLN
INTRAVENOUS | Status: AC
  Administered 2019-03-20: 09:00:00 50 mL via INTRAVENOUS
  Filled 2019-03-20: qty 50

## 2019-03-20 MED ORDER — DEXTROSE 5 % IV SOLN
Freq: Once | INTRAVENOUS | Status: AC
  Administered 2019-03-20: 10:00:00 via INTRAVENOUS

## 2019-03-20 MED ORDER — DEXTROSE-NACL 5-0.45 % IV SOLN
INTRAVENOUS | Status: DC
  Administered 2019-03-20 (×2): via INTRAVENOUS

## 2019-03-20 NOTE — H&P (Addendum)
History and Physical    Laurie Ross ZOX:[id number] DOB: 1940-12-14 DOA: 03/20/2019  PCP: [facility] Patient coming from: Home  Chief Complaint: Syncope  HPI: Laurie Ross is a 78 y.o. female with medical history significant of insulin-dependent diabetes mellitus type 2, hypothyroidism, obstructive sleep apnea, COPD, HTN presented to the hospital after being found on the floor.  Patient is a poor historian therefore most of the history provided by the daughter.  Apparently this morning patient was noted to be on the floor laying on her stomach.  When EMS was called she was noted to be hypoglycemic, amp of D50 insulin was given and brought to the hospital.  She took her routine dose of Lantus yesterday evening. Per daughter, patient lost one of her sisters last month and since then she has had very poor appetite.  In the ER CT of the head was negative but she had 2 other episodes of hypoglycemia requiring amp of D50 and eventually started on D5 fluids.  There was suspicion for left lower extremity erythema/cellulitis.  Due to elevated WBC, she was started on antibiotics.  Medical team was requested to admit the patient.  Seen and examined at bedside, seems comfortable.    Review of Systems: As per HPI otherwise 10 point review of systems negative.  Review of Systems Otherwise negative except as per HPI, including: General: Denies fever, chills, night sweats or unintended weight loss. Resp: Denies cough, wheezing, shortness of breath. Cardiac: Denies chest pain, palpitations, orthopnea, paroxysmal nocturnal dyspnea. GI: Denies abdominal pain, nausea, vomiting, diarrhea or constipation GU: Denies dysuria, frequency, hesitancy or incontinence MS: Denies muscle aches, joint pain or swelling Neuro: Denies headache, neurologic deficits (focal weakness, numbness, tingling), abnormal gait Psych: Denies anxiety, depression, SI/HI/AVH Skin: Denies new rashes or  lesions ID: Denies sick contacts, exotic exposures, travel  Past Medical History:  Diagnosis Date   Anemia    Arthritis    Asthma    CHF (congestive heart failure) (HCC)    Chronic kidney disease    stage III   COPD (chronic obstructive pulmonary disease) (HCC)    occasional home O2 use   Depression    Diabetes mellitus without complication (HCC)    Dysrhythmia    bradycardia   GERD (gastroesophageal reflux disease)    Glaucoma    Hx of Clostridium difficile infection March 2016   Hyperlipidemia    Hypertension    Hypothyroidism    Obstructive sleep apnea    Sciatica    Venous insufficiency     Past Surgical History:  Procedure Laterality Date   BREAST BIOPSY     CATARACT EXTRACTION W/PHACO Right 03/23/2016   Procedure: CATARACT EXTRACTION PHACO AND INTRAOCULAR LENS PLACEMENT (IOC);  Surgeon: Lockie Mola, MD;  Location: [facility];  Service: Ophthalmology;  Laterality: Right;  DIABETIC - insulin and oral meds PLEASE LEAVE PT 2ND    SOCIAL HISTORY:  reports that she has never smoked. She has never used smokeless tobacco. She reports that she does not drink alcohol or use drugs.  Allergies  Allergen Reactions   Penicillins Itching    Has patient had a PCN reaction causing immediate rash, facial/tongue/throat swelling, SOB or lightheadedness with hypotension: No Has patient had a PCN reaction causing severe rash involving mucus membranes or skin necrosis: No Has patient had a PCN reaction that required hospitalization No Has patient had a PCN reaction occurring within the last 10 years: No If all of the above answers are "  NO", then may proceed with Cephalosporin use.    Codeine Other (See Comments)    Unknown reaction   Glucophage [Metformin Hcl] Other (See Comments)    Unknown reaction   Loratadine Other (See Comments)    Unknown reaction    FAMILY HISTORY: Family History  Problem Relation Age of Onset   Breast cancer  Mother    Heart disease Father      Prior to Admission medications   Medication Sig Start Date End Date Taking? Authorizing Provider  acetaminophen (TYLENOL) 650 MG CR tablet Take 1,300 mg by mouth 2 (two) times a day.    [provider]  albuterol (PROVENTIL) (2.5 MG/3ML) 0.083% nebulizer solution Take 2.5 mg by nebulization every 4 (four) hours as needed for wheezing or shortness of breath.     [provider]  amLODipine (NORVASC) 5 MG tablet Take 1 tablet (5 mg total) by mouth daily. 10/23/18   Enid Baas, MD  aspirin EC 81 MG tablet Take 81 mg by mouth daily.    [provider]  brimonidine (ALPHAGAN) 0.2 % ophthalmic solution Place 1 drop into the right eye 3 (three) times daily.    [provider]  budesonide (PULMICORT) 0.25 MG/2ML nebulizer solution Take 0.25 mg by nebulization 2 (two) times daily.    [provider]  Calcium Carb-Cholecalciferol 600-400 MG-UNIT TABS Take 1 tablet by mouth 2 (two) times a day.    [provider]  calcium carbonate (TUMS - DOSED IN MG ELEMENTAL CALCIUM) 500 MG chewable tablet Chew 1 tablet by mouth every 6 (six) hours as needed for indigestion or heartburn.     [provider]  cetirizine (ZYRTEC) 5 MG tablet Take 5 mg by mouth at bedtime.     [provider]  docusate sodium (COLACE) 100 MG capsule Take 100 mg by mouth daily.    [provider]  dorzolamide-timolol (COSOPT) 22.3-6.8 MG/ML ophthalmic solution Place 1 drop into both eyes 2 (two) times daily.    [provider]  ferrous sulfate 325 (65 FE) MG tablet Take 325 mg by mouth every Monday, Wednesday, and Friday.     [provider]  fluticasone (FLONASE) 50 MCG/ACT nasal spray Place 2 sprays into both nostrils daily.    [provider]  glucose 4 GM chewable tablet Chew 1 tablet by mouth as needed for low blood sugar.    [provider]  insulin glargine (LANTUS) 100  UNIT/ML injection Inject 0.15 mLs (15 Units total) into the skin at bedtime. 10/23/18   Enid Baas, MD  latanoprost (XALATAN) 0.005 % ophthalmic solution Place 1 drop into both eyes at bedtime.    [provider]  levothyroxine (SYNTHROID, LEVOTHROID) 50 MCG tablet Take 50 mcg by mouth daily.    [provider]  loperamide (IMODIUM) 2 MG capsule Take 2 mg by mouth as needed for diarrhea or loose stools (max 4 capsules in 24 hours).     [provider]  omeprazole (PRILOSEC) 20 MG capsule Take 20 mg by mouth daily.    [provider]  ondansetron (ZOFRAN) 4 MG tablet Take 4 mg by mouth every 8 (eight) hours as needed for nausea or vomiting.    [provider]  prednisoLONE acetate (PRED FORTE) 1 % ophthalmic suspension Place 1 drop into the right eye 2 (two) times a day.    [provider]  predniSONE (DELTASONE) 20 MG tablet Take 20 mg by mouth 3 (three) times daily as  needed (breathing difficulty).    [provider]  sertraline (ZOLOFT) 100 MG tablet Take 100 mg by mouth at bedtime.     [provider]  sertraline (ZOLOFT) 50 MG tablet Take 50 mg by mouth at bedtime.    [provider]  traZODone (DESYREL) 100 MG tablet Take 100 mg by mouth at bedtime.    [provider]  valACYclovir (VALTREX) 500 MG tablet Take 500 mg by mouth daily.    [provider]  Vitamins A & D (VITAMIN A & D) ointment Apply 1 application topically 4 (four) times daily as needed for dry skin.     [provider]  Zinc Oxide 13 % CREA Apply 1 application topically 3 (three) times daily. After toileting    [provider]    Physical Exam: Vitals:   03/20/19 0911 03/20/19 0912 03/20/19 0913 03/20/19 0914  BP:   102/61   Pulse: 61 (!) 105 (!) 103 87  Resp: (!) 24 (!) 23 (!) 23 (!) 31  Temp:      TempSrc:      SpO2: 94% 94% 96% 94%  Weight:      Height:          Constitutional: Calm  comfortable, not in acute distress Eyes: PERRL, lids and conjunctivae normal ENMT: Mucous membranes are moist. Posterior pharynx clear of any exudate or lesions.Normal dentition.  Neck: normal, supple, no masses, no thyromegaly Respiratory: Minimal bibasilar crackles Cardiovascular: Regular rate and rhythm, no murmurs / rubs / gallops. No extremity edema. 2+ pedal pulses. No carotid bruits.  Abdomen: no tenderness, no masses palpated. No hepatosplenomegaly. Bowel sounds positive.  Musculoskeletal: no clubbing / cyanosis. No joint deformity upper and lower extremities. Good ROM, no contractures. Normal muscle tone.  Skin: Left lower extremity erythema on the calf area noted and warm to touch. Neurologic: Strength 4/5 in all extremities.  No focal neuro deficits Psychiatric: Poor judgment and insight.  Alert to name and place, confused about date but appears to be at her baseline    Labs on Admission: I have personally reviewed following labs and imaging studies  CBC: Recent Labs  Lab 03/20/19 0711  WBC 15.4*  HGB 13.0  HCT 40.4  MCV 101.8*  PLT 119*   Basic Metabolic Panel: Recent Labs  Lab 03/20/19 0711  NA 142  K 3.2*  CL 105  CO2 29  GLUCOSE 70  BUN 28*  CREATININE 1.15*  CALCIUM 8.8*   GFR: Estimated Creatinine Clearance: 38.3 mL/min (A) (by C-G formula based on SCr of 1.15 mg/dL (H)). Liver Function Tests: Recent Labs  Lab 03/20/19 0711  AST 19  ALT 17  ALKPHOS 79  BILITOT 0.8  PROT 6.4*  ALBUMIN 3.0*   No results for input(s): LIPASE, AMYLASE in the last 168 hours. No results for input(s): AMMONIA in the last 168 hours. Coagulation Profile: No results for input(s): INR, PROTIME in the last 168 hours. Cardiac Enzymes: No results for input(s): CKTOTAL, CKMB, CKMBINDEX, TROPONINI in the last 168 hours. BNP (last 3 results) No results for input(s): PROBNP in the last 8760 hours. HbA1C: No results for input(s): HGBA1C in the last 72 hours. CBG: Recent  Labs  Lab 03/20/19 0704 03/20/19 0826 03/20/19 0828 03/20/19 0915 03/20/19 1002  GLUCAP 110* 22* 20* 91 56*   Lipid Profile: No results for input(s): CHOL, HDL, LDLCALC, TRIG, CHOLHDL, LDLDIRECT in the last 72 hours. Thyroid Function Tests: No results for input(s): TSH, T4TOTAL, FREET4, T3FREE, THYROIDAB  in the last 72 hours. Anemia Panel: No results for input(s): VITAMINB12, FOLATE, FERRITIN, TIBC, IRON, RETICCTPCT in the last 72 hours. Urine analysis:    Component Value Date/Time   COLORURINE YELLOW (A) 03/20/2019 0856   APPEARANCEUR HAZY (A) 03/20/2019 0856   LABSPEC 1.021 03/20/2019 0856   PHURINE 5.0 03/20/2019 0856   GLUCOSEU NEGATIVE 03/20/2019 0856   HGBUR NEGATIVE 03/20/2019 0856   BILIRUBINUR NEGATIVE 03/20/2019 0856   KETONESUR NEGATIVE 03/20/2019 0856   PROTEINUR 100 (A) 03/20/2019 0856   NITRITE NEGATIVE 03/20/2019 0856   LEUKOCYTESUR NEGATIVE 03/20/2019 0856   Sepsis Labs: !!!!!!!!!!!!!!!!!!!!!!!!!!!!!!!!!!!!!!!!!!!! @LABRCNTIP (procalcitonin:4,lacticidven:4) )No results found for this or any previous visit (from the past 240 hour(s)).   Radiological Exams on Admission: Ct Head Wo Contrast  Result Date: 03/20/2019 CLINICAL DATA:  Unwitnessed fall. EXAM: CT HEAD WITHOUT CONTRAST TECHNIQUE: Contiguous axial images were obtained from the base of the skull through the vertex without intravenous contrast. COMPARISON:  October 21, 2018. FINDINGS: Brain: Mild chronic ischemic white matter disease is noted. No mass effect or midline shift is noted. Ventricular size is within normal limits. There is no evidence of mass lesion, hemorrhage or acute infarction. Vascular: No hyperdense vessel or unexpected calcification. Skull: Normal. Negative for fracture or focal lesion. Sinuses/Orbits: No acute finding. Other: None. IMPRESSION: Mild chronic ischemic white matter disease. No acute intracranial abnormality seen. Electronically Signed   By: Marijo Conception M.D.   On: 03/20/2019  09:45     All images have been reviewed by me personally.   Assessment/Plan Principal Problem:   Syncope Active Problems:   Sepsis (HCC)   Hypokalemia   Hypoglycemia due to insulin   DM2 (diabetes mellitus, type 2) (HCC)   CKD stage G3a/A1, GFR 45-59 and albumin creatinine ratio <30 mg/g   Cellulitis of left leg   Hypothyroidism   GERD (gastroesophageal reflux disease)   Hypoglycemia associated with type 2 diabetes mellitus (HCC)    Syncope, suspect from hypoglycemia Insulin-dependent diabetes mellitus type 2 -CT of the head is negative.  Admit the patient due to persistent hypoglycemia -D5 half-normal saline, Accu-Chek every 4 hours -Hold off on home insulin regimen. -Continue neurochecks.  If necessary will obtain MRI and further work-up -Check TSH, hemoglobin A1c -Supportive care.  Sepsis secondary to left lower extremity cellulitis, POA -Cultures ordered.  IV Rocephin. -Lower extremity Dopplers  Hypoxia, 3 L nasal cannula -Minimal bibasilar crackles.  As needed bronchodilators if needed. -Check chest x-ray, procalcitonin, BNP. COVID 19= pending.   Essential hypertension -Cardizem 360mg  daily.   Hypothyroidism -Check TSH.  Continue Synthroid  Depression -Continue Zoloft.  Iron deficiency anemia -Iron supplements daily.   DVT prophylaxis: Lovenox Code Status: DNR Family Communication: Spoke with patient's daughter over the phone Disposition Plan: None Consults called: None Admission status: Observation admission due to persistent symptomatic hypoglycemia.   Time Spent: 65 minutes.  >50% of the time was devoted to discussing the patients care, assessment, plan and disposition with other care givers along with counseling the patient about the risks and benefits of treatment.    Lowen Mansouri Arsenio Loader MD [facility]  If 7PM-7AM, please contact night-coverage   03/20/2019, 10:45 AM

## 2019-03-20 NOTE — ED Provider Notes (Signed)
[facility] Emergency Department Provider Note  Time seen: 7:34 AM  I have reviewed the triage vital signs and the nursing notes.   HISTORY  Chief Complaint Hypoglycemia   HPI Laurie Ross is a 78 y.o. female with a past medical history of anemia, arthritis, CHF, COPD, CKD, diabetes, gastric reflux, hypertension, hyperlipidemia presents to the emergency department for hypoglycemia.   According to EMS report family called out for decreased responsiveness patient found to be hypoglycemic in the 30s.  Per EMS per family patient has a history of hypoglycemia in the past.  Patient is an overall poor historian, lives with her 2 daughters who care for her and dose her medications per patient.  Patient is on Lantus insulin each evening.  Patient received D10 in route to the hospital recheck upon arrival fingerstick of 110.  Patient is awake alert mentating well.  She is oriented to person place and time.  Denies any recent fever cough congestion shortness of breath chest pain or abdominal pain.  Past Medical History:  Diagnosis Date  . Anemia   . Arthritis   . Asthma   . CHF (congestive heart failure) (HCC)   . Chronic kidney disease    stage III  . COPD (chronic obstructive pulmonary disease) (HCC)    occasional home O2 use  . Depression   . Diabetes mellitus without complication (HCC)   . Dysrhythmia    bradycardia  . GERD (gastroesophageal reflux disease)   . Glaucoma   . Hx of Clostridium difficile infection March 2016  . Hyperlipidemia   . Hypertension   . Hypothyroidism   . Obstructive sleep apnea   . Sciatica   . Venous insufficiency     Patient Active Problem List   Diagnosis Date Noted  . Hypokalemia 10/21/2018  . Sepsis (HCC) 11/25/2015  . Diarrhea 09/24/2014    Past Surgical History:  Procedure Laterality Date  . BREAST BIOPSY    . CATARACT EXTRACTION W/PHACO Right 03/23/2016   Procedure: CATARACT EXTRACTION PHACO AND INTRAOCULAR LENS  PLACEMENT (IOC);  Surgeon: Lockie Mola, MD;  Location: [facility];  Service: Ophthalmology;  Laterality: Right;  DIABETIC - insulin and oral meds PLEASE LEAVE PT 2ND    Prior to Admission medications   Medication Sig Start Date End Date Taking? Authorizing Provider  acetaminophen (TYLENOL) 650 MG CR tablet Take 1,300 mg by mouth 2 (two) times a day.    [provider]  albuterol (PROVENTIL) (2.5 MG/3ML) 0.083% nebulizer solution Take 2.5 mg by nebulization every 4 (four) hours as needed for wheezing or shortness of breath.     [provider]  amLODipine (NORVASC) 5 MG tablet Take 1 tablet (5 mg total) by mouth daily. 10/23/18   Enid Baas, MD  aspirin EC 81 MG tablet Take 81 mg by mouth daily.    [provider]  brimonidine (ALPHAGAN) 0.2 % ophthalmic solution Place 1 drop into the right eye 3 (three) times daily.    [provider]  budesonide (PULMICORT) 0.25 MG/2ML nebulizer solution Take 0.25 mg by nebulization 2 (two) times daily.    [provider]  Calcium Carb-Cholecalciferol 600-400 MG-UNIT TABS Take 1 tablet by mouth 2 (two) times a day.    [provider]  calcium carbonate (TUMS - DOSED IN MG ELEMENTAL CALCIUM) 500 MG chewable tablet Chew 1 tablet by mouth every 6 (six) hours as needed for indigestion or heartburn.     [provider]  cetirizine Harless Nakayama)  5 MG tablet Take 5 mg by mouth at bedtime.     [provider]  docusate sodium (COLACE) 100 MG capsule Take 100 mg by mouth daily.    [provider]  dorzolamide-timolol (COSOPT) 22.3-6.8 MG/ML ophthalmic solution Place 1 drop into both eyes 2 (two) times daily.    [provider]  ferrous sulfate 325 (65 FE) MG tablet Take 325 mg by mouth every Monday, Wednesday, and Friday.     [provider]  fluticasone (FLONASE) 50 MCG/ACT nasal spray Place 2 sprays into both nostrils daily.    [provider]   glucose 4 GM chewable tablet Chew 1 tablet by mouth as needed for low blood sugar.    [provider]  insulin glargine (LANTUS) 100 UNIT/ML injection Inject 0.15 mLs (15 Units total) into the skin at bedtime. 10/23/18   Enid BaasKalisetti, Radhika, MD  latanoprost (XALATAN) 0.005 % ophthalmic solution Place 1 drop into both eyes at bedtime.    [provider]  levothyroxine (SYNTHROID, LEVOTHROID) 50 MCG tablet Take 50 mcg by mouth daily.    [provider]  loperamide (IMODIUM) 2 MG capsule Take 2 mg by mouth as needed for diarrhea or loose stools (max 4 capsules in 24 hours).     [provider]  omeprazole (PRILOSEC) 20 MG capsule Take 20 mg by mouth daily.    [provider]  ondansetron (ZOFRAN) 4 MG tablet Take 4 mg by mouth every 8 (eight) hours as needed for nausea or vomiting.    [provider]  prednisoLONE acetate (PRED FORTE) 1 % ophthalmic suspension Place 1 drop into the right eye 2 (two) times a day.    [provider]  predniSONE (DELTASONE) 20 MG tablet Take 20 mg by mouth 3 (three) times daily as needed (breathing difficulty).    [provider]  sertraline (ZOLOFT) 100 MG tablet Take 100 mg by mouth at bedtime.     [provider]  sertraline (ZOLOFT) 50 MG tablet Take 50 mg by mouth at bedtime.    [provider]  traZODone (DESYREL) 100 MG tablet Take 100 mg by mouth at bedtime.    [provider]  valACYclovir (VALTREX) 500 MG tablet Take 500 mg by mouth daily.    [provider]  Vitamins A & D (VITAMIN A & D) ointment Apply 1 application topically 4 (four) times daily as needed for dry skin.     [provider]  Zinc Oxide 13 % CREA Apply 1 application topically 3 (three) times daily. After toileting    [provider]    Allergies  Allergen Reactions  . Penicillins Itching    Has patient had a PCN reaction causing immediate rash, facial/tongue/throat  swelling, SOB or lightheadedness with hypotension: No Has patient had a PCN reaction causing severe rash involving mucus membranes or skin necrosis: No Has patient had a PCN reaction that required hospitalization No Has patient had a PCN reaction occurring within the last 10 years: No If all of the above answers are "NO", then may proceed with Cephalosporin use.   . Codeine Other (See Comments)    Unknown reaction  . Glucophage [Metformin Hcl] Other (See Comments)    Unknown reaction  . Loratadine Other (See Comments)    Unknown reaction    Family History  Problem Relation Age of Onset  . Breast cancer Mother   . Heart disease Father     Social History Social History  Tobacco Use  . Smoking status: Never Smoker  . Smokeless tobacco: Never Used  Substance Use Topics  . Alcohol use: No  . Drug use: No    Review of Systems Constitutional: Negative for fever. ENT: Negative for recent illness/congestion Cardiovascular: Negative for chest pain. Respiratory: Negative for shortness of breath.  Negative for cough. Gastrointestinal: Negative for abdominal pain Musculoskeletal: Negative for musculoskeletal complaints  Neurological: Negative for headache All other ROS negative  ____________________________________________   PHYSICAL EXAM:  VITAL SIGNS: ED Triage Vitals  Enc Vitals Group     BP 03/20/19 0701 (!) 172/88     Pulse Rate 03/20/19 0701 93     Resp 03/20/19 0701 (!) 29     Temp 03/20/19 0701 (!) 95.7 F (35.4 C)     Temp Source 03/20/19 0701 Rectal     SpO2 03/20/19 0701 93 %     Weight 03/20/19 0705 178 lb 9.2 oz (81 kg)     Height 03/20/19 0705 5\' 1"  (1.549 m)     Head Circumference --      Peak Flow --      Pain Score 03/20/19 0704 0     Pain Loc --      Pain Edu? --      Excl. in GC? --    Constitutional: Alert and oriented. Well appearing and in no distress. Eyes: Normal exam ENT      Head: Normocephalic and atraumatic.      Mouth/Throat:  Mucous membranes are moist. Cardiovascular: Normal rate, regular rhythm.  Respiratory: Normal respiratory effort without tachypnea nor retractions. Breath sounds are clear  Gastrointestinal: Soft and nontender. No distention.   Musculoskeletal: Mild erythema with several sores to the left lower extremity. Neurologic:  Normal speech and language. No gross focal neurologic deficits  Skin:  Skin is warm, dry and intact.  Psychiatric: Mood and affect are normal.   ____________________________________________    EKG  EKG viewed and interpreted by myself shows atrial fibrillation at 110 bpm with a narrow QRS, normal axis, normal intervals besides slight QTC prolongation.  Nonspecific ST changes.  ____________________________________________   INITIAL IMPRESSION / ASSESSMENT AND PLAN / ED COURSE  Pertinent labs & imaging results that were available during my care of the patient were reviewed by me and considered in my medical decision making (see chart for details).   Patient presents to the emergency department for hypoglycemia.  Blood glucose in the 30s initially now 110 after D10 administration by EMS.  Overall patient appears well she has a poor historian, awaiting family arrival for further details and history.  We will check lab work.  On physical exam patient does appear to have a mild cellulitis to her left lower extremity.  Patient's blood glucose dropped to 20, given an amp of D50.  Continue to monitor the patient she becomes much more alert and interactive and then began to decline again blood glucose on recheck is down to 56.  We will start the patient on 5% dextrose.  We will treat with IV antibiotics to cover her for her left lower extremity cellulitis and admit to the hospitalist service.  Laurie Ross was evaluated in Emergency Department on 03/20/2019 for the symptoms described in the history of present illness. She was evaluated in the context of the global COVID-19  pandemic, which necessitated consideration that the patient might be at risk for infection with the SARS-CoV-2 virus that causes COVID-19. Institutional protocols and algorithms that pertain to the evaluation  of patients at risk for COVID-19 are in a state of rapid change based on information released by regulatory bodies including the CDC and federal and state organizations. These policies and algorithms were followed during the patient's care in the ED.  ____________________________________________   FINAL CLINICAL IMPRESSION(S) / ED DIAGNOSES  Cellulitis Hypoglycemia   Harvest Dark, MD 03/20/19 1007

## 2019-03-20 NOTE — Progress Notes (Addendum)
Inpatient Diabetes Program Recommendations  AACE/ADA: New Consensus Statement on Inpatient Glycemic Control (2015)  Target Ranges:  Prepandial:   less than 140 mg/dL      Peak postprandial:   less than 180 mg/dL (1-2 hours)      Critically ill patients:  140 - 180 mg/dL   Lab Results  Component Value Date   GLUCAP 56 (L) 03/20/2019   HGBA1C 6.6 (H) 10/22/2018   Results for LEENA, TIEDE (MRN [id number]) as of 03/20/2019 11:38  Ref. Range 03/20/2019 07:04 03/20/2019 08:26 03/20/2019 08:28 03/20/2019 09:15 03/20/2019 10:02  Glucose-Capillary Latest Ref Range: 70 - 99 mg/dL 110 (H) 22 (LL) 20 (LL) 91 56 (L)    Review of Glycemic Control  Diabetes history: DM2 Outpatient Diabetes medications: Lantus 15 units QD (Taking 35 units QD) Current orders for Inpatient glycemic control: Novolog 0-6 units TID with meals  Inpatient Diabetes Program Recommendations:     -DM referral received.   Spoke with daughter [name] on the phone.  Patient lives with [name] and another daughter.  Patients sister passed on 2023-03-02 and she has not been eating well.  Daughter states she has worsening dementia.  They have been checking her CBG's more often since she has had a decrease in appetite.  [facility] followed [facility].  They provide her with her medical care and medications.  Daughter states she has had some low blood sugars lately and they know when she is low as she begins to not make sense.  They correct with juice.    -May not need Lantus at discharge and follow follow up with PCP  Thank you, Geoffry Paradise, RN, BSN Diabetes Coordinator Inpatient Diabetes Program [phone] (team pager from 8a-5p)

## 2019-03-20 NOTE — ED Notes (Signed)
Pt blood sugar 35 on EMS arrival, D10 given in route, went up to 300.

## 2019-03-20 NOTE — ED Notes (Signed)
Pt provided meal tray, grape juice, and apple juice. This tech helped feed pt.

## 2019-03-20 NOTE — ED Triage Notes (Signed)
Pt arrived via [facility] with hypoglycemia with a cbg of 34. Pt has reoccurring hypoglycemia. Pt has edmea to bilateral lower extremities. Hx of chronic back pain,dm, copd, chf, asthma.

## 2019-03-20 NOTE — ED Notes (Signed)
Dr. Nelva Bush aware that we were only able to obtain the blue top for one set of cultures.

## 2019-03-20 NOTE — ED Notes (Signed)
Pt family states on the phone that the patient fell this morning. Pt family states that they found her on her stomach on the floor and that they think she knew her blood sugar was dropping and that she got up to get something for it and fell.  Dr. Kerman Passey notified of this new information.

## 2019-03-21 ENCOUNTER — Encounter: Payer: Self-pay | Admitting: Internal Medicine

## 2019-03-21 ENCOUNTER — Observation Stay (HOSPITAL_BASED_OUTPATIENT_CLINIC_OR_DEPARTMENT_OTHER)
Admit: 2019-03-21 | Discharge: 2019-03-21 | Disposition: A | Discharge status: Home / Self Care | Payer: Medicare (Managed Care) | Attending: Internal Medicine | Admitting: Internal Medicine

## 2019-03-21 DIAGNOSIS — I509 Heart failure, unspecified: Secondary | ICD-10-CM | POA: Diagnosis not present

## 2019-03-21 DIAGNOSIS — I272 Pulmonary hypertension, unspecified: Secondary | ICD-10-CM | POA: Diagnosis present

## 2019-03-21 DIAGNOSIS — L03116 Cellulitis of left lower limb: Secondary | ICD-10-CM | POA: Diagnosis present

## 2019-03-21 DIAGNOSIS — Z20828 Contact with and (suspected) exposure to other viral communicable diseases: Secondary | ICD-10-CM | POA: Diagnosis present

## 2019-03-21 DIAGNOSIS — I130 Hypertensive heart and chronic kidney disease with heart failure and stage 1 through stage 4 chronic kidney disease, or unspecified chronic kidney disease: Secondary | ICD-10-CM | POA: Diagnosis present

## 2019-03-21 DIAGNOSIS — E872 Acidosis: Secondary | ICD-10-CM | POA: Diagnosis present

## 2019-03-21 DIAGNOSIS — E039 Hypothyroidism, unspecified: Secondary | ICD-10-CM | POA: Diagnosis present

## 2019-03-21 DIAGNOSIS — Z794 Long term (current) use of insulin: Secondary | ICD-10-CM | POA: Diagnosis not present

## 2019-03-21 DIAGNOSIS — Z9981 Dependence on supplemental oxygen: Secondary | ICD-10-CM | POA: Diagnosis not present

## 2019-03-21 DIAGNOSIS — D696 Thrombocytopenia, unspecified: Secondary | ICD-10-CM | POA: Diagnosis present

## 2019-03-21 DIAGNOSIS — J9621 Acute and chronic respiratory failure with hypoxia: Secondary | ICD-10-CM | POA: Diagnosis present

## 2019-03-21 DIAGNOSIS — I361 Nonrheumatic tricuspid (valve) insufficiency: Secondary | ICD-10-CM

## 2019-03-21 DIAGNOSIS — I340 Nonrheumatic mitral (valve) insufficiency: Secondary | ICD-10-CM

## 2019-03-21 DIAGNOSIS — E162 Hypoglycemia, unspecified: Secondary | ICD-10-CM | POA: Diagnosis not present

## 2019-03-21 DIAGNOSIS — N1831 Chronic kidney disease, stage 3a: Secondary | ICD-10-CM | POA: Diagnosis present

## 2019-03-21 DIAGNOSIS — E160 Drug-induced hypoglycemia without coma: Secondary | ICD-10-CM

## 2019-03-21 DIAGNOSIS — I5033 Acute on chronic diastolic (congestive) heart failure: Secondary | ICD-10-CM | POA: Diagnosis present

## 2019-03-21 DIAGNOSIS — Z66 Do not resuscitate: Secondary | ICD-10-CM | POA: Diagnosis present

## 2019-03-21 DIAGNOSIS — T383X5A Adverse effect of insulin and oral hypoglycemic [antidiabetic] drugs, initial encounter: Secondary | ICD-10-CM

## 2019-03-21 DIAGNOSIS — F0390 Unspecified dementia without behavioral disturbance: Secondary | ICD-10-CM | POA: Diagnosis present

## 2019-03-21 DIAGNOSIS — R531 Weakness: Secondary | ICD-10-CM | POA: Diagnosis present

## 2019-03-21 DIAGNOSIS — I4891 Unspecified atrial fibrillation: Secondary | ICD-10-CM | POA: Diagnosis present

## 2019-03-21 DIAGNOSIS — I5031 Acute diastolic (congestive) heart failure: Secondary | ICD-10-CM | POA: Diagnosis not present

## 2019-03-21 DIAGNOSIS — E876 Hypokalemia: Secondary | ICD-10-CM | POA: Diagnosis present

## 2019-03-21 DIAGNOSIS — Z79899 Other long term (current) drug therapy: Secondary | ICD-10-CM | POA: Diagnosis not present

## 2019-03-21 DIAGNOSIS — J449 Chronic obstructive pulmonary disease, unspecified: Secondary | ICD-10-CM | POA: Diagnosis present

## 2019-03-21 DIAGNOSIS — D509 Iron deficiency anemia, unspecified: Secondary | ICD-10-CM | POA: Diagnosis present

## 2019-03-21 DIAGNOSIS — E11649 Type 2 diabetes mellitus with hypoglycemia without coma: Secondary | ICD-10-CM | POA: Diagnosis present

## 2019-03-21 DIAGNOSIS — I248 Other forms of acute ischemic heart disease: Secondary | ICD-10-CM | POA: Diagnosis present

## 2019-03-21 DIAGNOSIS — N179 Acute kidney failure, unspecified: Secondary | ICD-10-CM | POA: Diagnosis present

## 2019-03-21 DIAGNOSIS — E1122 Type 2 diabetes mellitus with diabetic chronic kidney disease: Secondary | ICD-10-CM | POA: Diagnosis present

## 2019-03-21 DIAGNOSIS — D631 Anemia in chronic kidney disease: Secondary | ICD-10-CM | POA: Diagnosis present

## 2019-03-21 LAB — COMPREHENSIVE METABOLIC PANEL
ALT: 16 U/L (ref 0–44)
AST: 16 U/L (ref 15–41)
Albumin: 2.5 g/dL — ABNORMAL LOW (ref 3.5–5.0)
Alkaline Phosphatase: 64 U/L (ref 38–126)
Anion gap: 7 (ref 5–15)
BUN: 22 mg/dL (ref 8–23)
CO2: 29 mmol/L (ref 22–32)
Calcium: 8.3 mg/dL — ABNORMAL LOW (ref 8.9–10.3)
Chloride: 106 mmol/L (ref 98–111)
Creatinine, Ser: 1.05 mg/dL — ABNORMAL HIGH (ref 0.44–1.00)
GFR calc Af Amer: 58 mL/min — ABNORMAL LOW (ref >60)
GFR calc non Af Amer: 50 mL/min — ABNORMAL LOW (ref >60)
Glucose, Bld: 248 mg/dL — ABNORMAL HIGH (ref 70–99)
Potassium: 3.8 mmol/L (ref 3.5–5.1)
Sodium: 142 mmol/L (ref 135–145)
Total Bilirubin: 0.6 mg/dL (ref 0.3–1.2)
Total Protein: 5.4 g/dL — ABNORMAL LOW (ref 6.5–8.1)

## 2019-03-21 LAB — TSH: TSH: 2.418 u[IU]/mL (ref 0.350–4.500)

## 2019-03-21 LAB — GLUCOSE, CAPILLARY
Glucose-Capillary: 238 mg/dL — ABNORMAL HIGH (ref 70–99)
Glucose-Capillary: 224 mg/dL — ABNORMAL HIGH (ref 70–99)
Glucose-Capillary: 233 mg/dL — ABNORMAL HIGH (ref 70–99)
Glucose-Capillary: 197 mg/dL — ABNORMAL HIGH (ref 70–99)
Glucose-Capillary: 190 mg/dL — ABNORMAL HIGH (ref 70–99)

## 2019-03-21 LAB — CBC
HCT: 34.5 % — ABNORMAL LOW (ref 36.0–46.0)
Hemoglobin: 11.4 g/dL — ABNORMAL LOW (ref 12.0–15.0)
MCH: 32.4 pg (ref 26.0–34.0)
MCHC: 33 g/dL (ref 30.0–36.0)
MCV: 98 fL (ref 80.0–100.0)
Platelets: 85 10*3/uL — ABNORMAL LOW (ref 150–400)
RBC: 3.52 MIL/uL — ABNORMAL LOW (ref 3.87–5.11)
RDW: 14.6 % (ref 11.5–15.5)
WBC: 8.3 10*3/uL (ref 4.0–10.5)
nRBC: 0 % (ref 0.0–0.2)

## 2019-03-21 LAB — ECHOCARDIOGRAM COMPLETE
Height: 61 in
Weight: 3216.95 oz

## 2019-03-21 LAB — PROTIME-INR
INR: 1 (ref 0.8–1.2)
Prothrombin Time: 13.3 seconds (ref 11.4–15.2)

## 2019-03-21 LAB — TROPONIN I (HIGH SENSITIVITY)
Troponin I (High Sensitivity): 22 ng/L — ABNORMAL HIGH (ref <18)
Troponin I (High Sensitivity): 25 ng/L — ABNORMAL HIGH (ref <18)

## 2019-03-21 LAB — APTT: aPTT: 26 seconds (ref 24–36)

## 2019-03-21 LAB — HEPARIN LEVEL (UNFRACTIONATED): Heparin Unfractionated: 0.89 IU/mL — ABNORMAL HIGH (ref 0.30–0.70)

## 2019-03-21 MED ORDER — HEPARIN (PORCINE) 25000 UT/250ML-% IV SOLN
750.0000 [IU]/h | INTRAVENOUS | Status: DC
  Administered 2019-03-21: 950 [IU]/h via INTRAVENOUS
  Filled 2019-03-21: qty 250

## 2019-03-21 MED ORDER — METOPROLOL TARTRATE 5 MG/5ML IV SOLN
5.0000 mg | Freq: Once | INTRAVENOUS | Status: AC
  Administered 2019-03-21: 5 mg via INTRAVENOUS

## 2019-03-21 MED ORDER — METOPROLOL TARTRATE 5 MG/5ML IV SOLN
5.0000 mg | Freq: Once | INTRAVENOUS | Status: DC
  Filled 2019-03-21: qty 5

## 2019-03-21 MED ORDER — FUROSEMIDE 10 MG/ML IJ SOLN
40.0000 mg | Freq: Two times a day (BID) | INTRAMUSCULAR | Status: AC
  Administered 2019-03-21 – 2019-03-22 (×2): 40 mg via INTRAVENOUS
  Filled 2019-03-21 (×2): qty 4

## 2019-03-21 MED ORDER — HEPARIN BOLUS VIA INFUSION
4000.0000 [IU] | Freq: Once | INTRAVENOUS | Status: AC
  Administered 2019-03-21: 4000 [IU] via INTRAVENOUS
  Filled 2019-03-21: qty 4000

## 2019-03-21 MED ORDER — DILTIAZEM HCL-DEXTROSE 100-5 MG/100ML-% IV SOLN (PREMIX)
5.0000 mg/h | INTRAVENOUS | Status: DC
  Filled 2019-03-21: qty 100

## 2019-03-21 NOTE — Care Management Obs Status (Deleted)
[place] NOTIFICATION   Patient Details  Name: Laurie Ross MRN: [id number] Date of Birth: [birth year]   Medicare Observation Status Notification Given:  Yes    Weston Anna, LCSW 03/21/2019, 9:51 AM

## 2019-03-21 NOTE — Progress Notes (Addendum)
PROGRESS NOTE    Laurie Ross  ZOX:[id number] DOB: 1940-08-18 DOA: 03/20/2019 PCP: [facility]    Brief Narrative:   Laurie Ross is a 78 y.o. female with medical history significant of insulin-dependent diabetes mellitus type 2, hypothyroidism, obstructive sleep apnea, COPD, HTN presented to the hospital after being found on the floor.  Patient is a poor historian therefore most of the history provided by the daughter.  Apparently this morning patient was noted to be on the floor laying on her stomach.  When EMS was called she was noted to be hypoglycemic, amp of D50 insulin was given and brought to the hospital.  She took her routine dose of Lantus yesterday evening. Per daughter, patient lost one of her sisters last month and since then she has had very poor appetite.  In the ER CT of the head was negative but she had 2 other episodes of hypoglycemia requiring amp of D50 and eventually started on D5 fluids.  There was suspicion for left lower extremity erythema/cellulitis.  Due to elevated WBC, she was started on antibiotics.  Medical team was requested to admit the patient.  Seen and examined at bedside, seems comfortable.   Interim History: -11/26: A fib with RVR, HR up to 159 -->IV cardizem and IV heparin.  - 11/26: card was consulted.   Subjective: Patient has shortness of breath, no cough, denies chest pain.  Patient has a bilateral leg edema.  No active nausea vomiting or diarrhea.  No abdominal pain.  No fever or chills.  Assessment & Plan:   Principal Problem:   Atrial fibrillation with RVR (HCC) Active Problems:   Sepsis (HCC)   Hypokalemia   Syncope   Hypoglycemia due to insulin   Type II diabetes mellitus with renal manifestations (HCC)   CKD stage G3a/A1, GFR 45-59 and albumin creatinine ratio <30 mg/g   Cellulitis of left leg   Hypothyroidism   GERD (gastroesophageal reflux disease)   Thrombocytopenia (HCC)   Acute on chronic  diastolic CHF (congestive heart failure) (HCC)   COPD (chronic obstructive pulmonary disease) (HCC)   Acute on chronic respiratory failure with hypoxia (HCC)   Atrial fibrillation with RVR (HCC): HR is up to 159.  hemodynamically stable.  Denies chest pain. Per her provider [facility], Dr. Malachi Paradise, patient was diagnosed with atrial fibrillation approximately 10 days ago.  Patient is not on anticoagulants. Card, Dr. Mariah Milling was consulted. Per Dr. Mariah Milling, "Could start heparin infusion with transition to NOACs at discharge".  -Highly appreciate Dr. Camillia Herter consultation -started cardizem gtt - start IV heparin -->closely monitoring platelet due to hx of  thrombocytopenia -IV lasix for CHF exacerbation - trop x 3 -check TSH level  Thrombocytopenia: chronic issue. Platelet 85 (119 on 03/20/19) -f/u by CBC  Acute on chronic diastolic CHF: 2d echo done today showed EF 55-60% with pleural effusions, 8 cm on the left severely elevated right heart pressures. BNP 453. 2+ bilateral leg edema. Chest x-ray small pleural effusions, vascular congestion -Lasix 40 mg bid by IV -Fluid restriction  Syncope possibly due to hypoglycemia: CT of the head is negative. Treated with D5 half-normal saline and hold off on home insulin regimen. CBG ~200 currently -Accu-Chek every 4 hours - started SSI -Continue neurochecks.  If necessary will obtain MRI and further work-up  Type II diabetes mellitus with renal manifestations: A1c 6.0 on 11/25, well controlled -hold home lantus -on SSI   Left lower extremity cellulitis, POA. Lower extremity Doppler  negative for DVT.    Patient has some mild erythema and tenderness in the left lower leg. Has leukocytosis which has resolved.  No fever.  Procalcitonin <0.10. -f/u Cultures ordered.   -on IV Rocephin -->may d/c abx if still no fever or leukocytosis tomorrow  Acute on chronic respiratory failure with hypoxia: on 3 L nasal cannula. likely due to dCHF exacerbation in the  setting of COPD. Covid 19 negative. - bronchodilators if needed.  Essential hypertension -Cardizem  daily -->on IV cardizem and IV lasix now  Hypothyroidism -Check TSH.  Continue Synthroid  Depression -Continue Zoloft.  Iron deficiency anemia -Iron supplements daily.      DVT prophylaxis: on IV Heparin Code Status: DNR Family Communication: I called her care provider, Dr. Malachi Paradise Disposition Plan: Patient has multiple comorbidities, presents with initially hypoglycemia, but found to have CHF exacerbation and atrial fibrillation with RVR with HR up to 159.  Her presentation is highly complicated.  Patient will need to be treated in hospital for at least 2 days, since she is at high risk of deterioration. Barriers for discharge:    Consultants:   Card  Procedures:    Antimicrobials: Anti-infectives (From admission, onward)   Start     Dose/Rate Route Frequency Ordered Stop   03/20/19 1700  valACYclovir (VALTREX) tablet 500 mg     500 mg Oral Daily 03/20/19 1138     03/20/19 1115  cefTRIAXone (ROCEPHIN) 1 g in sodium chloride 0.9 % 100 mL IVPB     1 g 200 mL/hr over 30 Minutes Intravenous Daily 03/20/19 1042     03/20/19 1015  clindamycin (CLEOCIN) IVPB 600 mg     600 mg 100 mL/hr over 30 Minutes Intravenous  Once 03/20/19 1007 03/20/19 1150      Objective: Vitals:   03/21/19 0845 03/21/19 1112 03/21/19 1113 03/21/19 1209  BP: (!) 156/76 (!) 159/89  105/79  Pulse: (!) 135 84 (!) 149 (!) 41  Resp:  (!) 38  (!) 35  Temp: 97.7 F (36.5 C) 98.4 F (36.9 C)  98.6 F (37 C)  TempSrc: Oral Oral  Oral  SpO2: (!) 89% 95% 95% (!) 79%  Weight:      Height:        Intake/Output Summary (Last 24 hours) at 03/21/2019 1548 Last data filed at 03/21/2019 4098 Gross per 24 hour  Intake 1431.94 ml  Output 150 ml  Net 1281.94 ml   Filed Weights   03/20/19 0705 03/21/19 0500  Weight: 81 kg 91.2 kg    Examination:  Physical Exam:  General: Not in  acute distress HEENT: PERRL, EOMI, no scleral icterus, positive JVD, no bruit Cardiac: S1/S2, irregularly irregular regular rhythm, No murmurs, gallops or rubs Pulm: Minimal bibasilar crackles Abd: Soft, nondistended, nontender, no rebound pain, no organomegaly, BS present Ext: 2+ edema. 1+DP/PT pulse bilaterally Musculoskeletal: No joint deformities, erythema, or stiffness, ROM full Skin: No rashes.  Neuro: Alert and oriented X3, cranial nerves II-XII grossly intact, moves all extremeties normally.  Psych: Patient is not psychotic, no suicidal or hemocidal ideation.    Data Reviewed: I have personally reviewed following labs and imaging studies  CBC: Recent Labs  Lab 03/20/19 0711 03/21/19 0549  WBC 15.4* 8.3  HGB 13.0 11.4*  HCT 40.4 34.5*  MCV 101.8* 98.0  PLT 119* 85*   Basic Metabolic Panel: Recent Labs  Lab 03/20/19 0711 03/21/19 0549  NA 142 142  K 3.2* 3.8  CL 105 106  CO2 29  29  GLUCOSE 70 248*  BUN 28* 22  CREATININE 1.15* 1.05*  CALCIUM 8.8* 8.3*   GFR: Estimated Creatinine Clearance: 44.7 mL/min (A) (by C-G formula based on SCr of 1.05 mg/dL (H)). Liver Function Tests: Recent Labs  Lab 03/20/19 0711 03/21/19 0549  AST 19 16  ALT 17 16  ALKPHOS 79 64  BILITOT 0.8 0.6  PROT 6.4* 5.4*  ALBUMIN 3.0* 2.5*   No results for input(s): LIPASE, AMYLASE in the last 168 hours. No results for input(s): AMMONIA in the last 168 hours. Coagulation Profile: Recent Labs  Lab 03/21/19 0549  INR 1.0   Cardiac Enzymes: No results for input(s): CKTOTAL, CKMB, CKMBINDEX, TROPONINI in the last 168 hours. BNP (last 3 results) No results for input(s): PROBNP in the last 8760 hours. HbA1C: Recent Labs    03/20/19 0711  HGBA1C 6.0*   CBG: Recent Labs  Lab 03/20/19 2145 03/20/19 2351 03/21/19 0436 03/21/19 0747 03/21/19 1135  GLUCAP 235* 245* 238* 224* 233*   Lipid Profile: No results for input(s): CHOL, HDL, LDLCALC, TRIG, CHOLHDL, LDLDIRECT in the  last 72 hours. Thyroid Function Tests: Recent Labs    03/20/19 0711  TSH 3.588   Anemia Panel: No results for input(s): VITAMINB12, FOLATE, FERRITIN, TIBC, IRON, RETICCTPCT in the last 72 hours. Sepsis Labs: Recent Labs  Lab 03/20/19 0711  PROCALCITON <0.10    Recent Results (from the past 240 hour(s))  SARS CORONAVIRUS 2 (TAT 6-24 HRS) Nasopharyngeal Nasopharyngeal Swab     Status: None   Collection Time: 03/20/19 10:45 AM   Specimen: Nasopharyngeal Swab  Result Value Ref Range Status   SARS Coronavirus 2 NEGATIVE NEGATIVE Final    Comment: (NOTE) SARS-CoV-2 target nucleic acids are NOT DETECTED. The SARS-CoV-2 RNA is generally detectable in upper and lower respiratory specimens during the acute phase of infection. Negative results do not preclude SARS-CoV-2 infection, do not rule out co-infections with other pathogens, and should not be used as the sole basis for treatment or other patient management decisions. Negative results must be combined with clinical observations, patient history, and epidemiological information. The expected result is Negative. Fact Sheet for Patients: HairSlick.nohttps://www.fda.gov/media/138098/download Fact Sheet for Healthcare Providers: quierodirigir.comhttps://www.fda.gov/media/138095/download This test is not yet approved or cleared by the [place] FDA and  has been authorized for detection and/or diagnosis of SARS-CoV-2 by FDA under an Emergency Use Authorization (EUA). This EUA will remain  in effect (meaning this test can be used) for the duration of the COVID-19 declaration under Section 56 4(b)(1) of the Act, 21 U.S.C. section 360bbb-3(b)(1), unless the authorization is terminated or revoked sooner. Performed at [facility], [address] [place], [place] [address]   Blood culture (routine x 2)     Status: None (Preliminary result)   Collection Time: 03/20/19 10:45 AM   Specimen: BLOOD  Result Value Ref Range Status   Specimen Description  BLOOD BLOOD LEFT HAND  Final   Special Requests   Final    BOTTLES DRAWN AEROBIC AND ANAEROBIC Blood Culture adequate volume   Culture   Final    NO GROWTH < 24 HOURS Performed at [facility], [address] [place], [place] [address]    Report Status PENDING  Incomplete  Blood culture (routine x 2)     Status: None (Preliminary result)   Collection Time: 03/20/19 10:45 AM   Specimen: BLOOD  Result Value Ref Range Status   Specimen Description BLOOD LEFT ANTECUBITAL  Final   Special Requests  Final    BOTTLES DRAWN AEROBIC ONLY Blood Culture adequate volume   Culture   Final    NO GROWTH < 24 HOURS Performed at [facility], [address] [place], [place] [address]    Report Status PENDING  Incomplete     RN Pressure Injury Documentation:       Radiology Studies: Ct Head Wo Contrast  Result Date: 03/20/2019 CLINICAL DATA:  Unwitnessed fall. EXAM: CT HEAD WITHOUT CONTRAST TECHNIQUE: Contiguous axial images were obtained from the base of the skull through the vertex without intravenous contrast. COMPARISON:  October 21, 2018. FINDINGS: Brain: Mild chronic ischemic white matter disease is noted. No mass effect or midline shift is noted. Ventricular size is within normal limits. There is no evidence of mass lesion, hemorrhage or acute infarction. Vascular: No hyperdense vessel or unexpected calcification. Skull: Normal. Negative for fracture or focal lesion. Sinuses/Orbits: No acute finding. Other: None. IMPRESSION: Mild chronic ischemic white matter disease. No acute intracranial abnormality seen. Electronically Signed   By: Marijo Conception M.D.   On: 03/20/2019 09:45   Lower Extremity Dopplers-[facility]  Result Date: 03/20/2019 CLINICAL DATA:  Edema and pain. EXAM: BILATERAL LOWER EXTREMITY VENOUS DOPPLER ULTRASOUND TECHNIQUE: Gray-scale sonography with compression, as well as color and duplex ultrasound, were performed to evaluate the deep venous system from the  level of the common femoral vein through the popliteal and proximal calf veins. Technologist describes technically limited exam due to swelling and body habitus. COMPARISON:  None FINDINGS: Normal compressibility of the common femoral, superficial femoral, and popliteal veins, as well as the proximal calf veins. No filling defects to suggest DVT on grayscale or color Doppler imaging. Doppler waveforms show normal direction of venous flow, normal respiratory phasicity and response to augmentation. Bilateral calf subcutaneous edema, right greater than left. IMPRESSION: No femoropopliteal and no calf DVT in the visualized calf veins. If clinical symptoms are inconsistent or if there are persistent or worsening symptoms, further imaging (possibly involving the iliac veins) may be warranted. Electronically Signed   By: Lucrezia Europe M.D.   On: 03/20/2019 14:00   Dg Chest Port 1 View  Result Date: 03/20/2019 CLINICAL DATA:  Fall EXAM: PORTABLE CHEST 1 VIEW COMPARISON:  October 21, 2018 FINDINGS: Small bilateral pleural effusions and bibasilar atelectasis/consolidation. Pulmonary vascular congestion and fissural fluid. Stable cardiomediastinal contours. IMPRESSION: Pulmonary vascular congestion, small bilateral pleural effusions, and bibasilar atelectasis/consolidation. Electronically Signed   By: Macy Mis M.D.   On: 03/20/2019 11:20        Scheduled Meds: . aspirin EC  81 mg Oral Daily  . docusate sodium  100 mg Oral Daily  . ferrous sulfate  325 mg Oral Q M,W,F  . furosemide  40 mg Intravenous Q12H  . heparin  4,000 Units Intravenous Once  . insulin aspart  0-6 Units Subcutaneous TID WC  . levothyroxine  50 mcg Oral Daily  . mouth rinse  15 mL Mouth Rinse BID  . pantoprazole  40 mg Oral Daily  . sertraline  150 mg Oral QHS  . traZODone  100 mg Oral QHS  . valACYclovir  500 mg Oral Daily   Continuous Infusions: . cefTRIAXone (ROCEPHIN)  IV 1 g (03/21/19 0923)  . diltiazem (CARDIZEM) infusion     . heparin       LOS: 0 days    Time spent: 30 min     Ivor Costa, DO [facility] PAGER is on [place]  If 7PM-7AM, please contact night-coverage [email or web address]  Password [email or web address] 03/21/2019, 3:48 PM

## 2019-03-21 NOTE — Progress Notes (Signed)
*  PRELIMINARY RESULTS* Echocardiogram 2D Echocardiogram has been performed.  Laurie Ross 03/21/2019, 1:19 PM

## 2019-03-21 NOTE — Consult Note (Signed)
ANTICOAGULATION CONSULT NOTE - Initial Consult  Pharmacy Consult for Heparin Drip Indication: atrial fibrillation  Allergies  Allergen Reactions  . Penicillins Itching    Has patient had a PCN reaction causing immediate rash, facial/tongue/throat swelling, SOB or lightheadedness with hypotension: No Has patient had a PCN reaction causing severe rash involving mucus membranes or skin necrosis: No Has patient had a PCN reaction that required hospitalization No Has patient had a PCN reaction occurring within the last 10 years: No If all of the above answers are "NO", then may proceed with Cephalosporin use.   . Codeine Other (See Comments)    Unknown reaction  . Glucophage [Metformin Hcl] Other (See Comments)    Unknown reaction  . Loratadine Other (See Comments)    Unknown reaction    Patient Measurements: Height: 5\' 1"  (154.9 cm) Weight: 201 lb 1 oz (91.2 kg) IBW/kg (Calculated) : 47.8 Heparin Dosing Weight: 66.1kg  Vital Signs: Temp: 98.6 F (37 C) (11/26 1209) Temp Source: Oral (11/26 1209) BP: 105/79 (11/26 1209) Pulse Rate: 41 (11/26 1209)  Labs: Recent Labs    03/20/19 0711 03/21/19 0549  HGB 13.0 11.4*  HCT 40.4 34.5*  PLT 119* 85*  LABPROT  --  13.3  INR  --  1.0  CREATININE 1.15* 1.05*    Estimated Creatinine Clearance: 44.7 mL/min (A) (by C-G formula based on SCr of 1.05 mg/dL (H)).   Medical History: Past Medical History:  Diagnosis Date  . Anemia   . Arthritis   . Asthma   . CHF (congestive heart failure) ([place])   . Chronic kidney disease    stage III  . COPD (chronic obstructive pulmonary disease) ([place])    occasional home O2 use  . Depression   . Diabetes mellitus without complication ([place])   . Dysrhythmia    bradycardia  . GERD (gastroesophageal reflux disease)   . Glaucoma   . Hx of Clostridium difficile infection March 2016  . Hyperlipidemia   . Hypertension   . Hypothyroidism   . Obstructive sleep apnea   . Sciatica   . Venous  insufficiency     Medications:  No PTA anticoagulation of note  Assessment: Laurie Ross is a 78 y.o. female with a hx of diabetes, anemia, chronic kidney disease, COPD, hypertension, hyperlipidemia presenting with hypoglycemia, pharmacy consulted for heparin dosing for atrial fibrillation  Goal of Therapy:  Heparin level 0.3-0.7 units/ml Monitor platelets by anticoagulation protocol: Yes   Plan:  Give 4000 units bolus x 1, followed by 950 units/hr  Will check Heparin Level (HL) in 8 hours per protocol and CBC daily while on heparin drip.  Lu Duffel, PharmD, BCPS Clinical Pharmacist 03/21/2019 3:32 PM

## 2019-03-21 NOTE — Evaluation (Signed)
Occupational Therapy Evaluation Patient Details Name: Laurie Ross MRN: [id number] DOB: [birth year] Today's Date: 03/21/2019    History of Present Illness Laurie MATHIA is a 78 y.o. female with medical history significant of insulin-dependent diabetes mellitus type 2, hypothyroidism, obstructive sleep apnea, COPD, HTN presented to the hospital ED by EMS 03/20/2019 after being found on the floor. She was admitted to the hospital with hypoglycemia, atrial fibrillation with RVR, pulmonary edema/acute diastolic CHF/pleural effusions.   Clinical Impression   Pt seen for OT evaluation this date. Prior to hospital admission, pt was requiring assistance from her two daughters that she lives with for ADL transfers to/from bed/wheelchair/BSC as well as some assist for higher level BADLS such as bathing, grooming, and toileting .  Pt lives in mobile home with ramped entrance per self report, but pt is somewhat confused and questionable historian.  Currently pt demonstrates impairments in fxl activity tolerance, strength, safety with ADL and bed mobility requiring MOD A for lateral rolling in bed and MIN A for bed level (with HOB elevated) grooming.  Pt would benefit from skilled OT to address noted impairments and functional limitations (see below for any additional details) in order to maximize safety and independence while minimizing falls risk and caregiver burden.  Upon hospital discharge, recommend pt discharge to SNF.    Follow Up Recommendations  SNF    Equipment Recommendations  Other (comment)(defer to next venue of care)    Recommendations for Other Services       Precautions / Restrictions Precautions Precautions: Fall Restrictions Weight Bearing Restrictions: No      Mobility Bed Mobility Overal bed mobility: Needs Assistance Bed Mobility: Rolling Rolling: Mod assist      Transfers                    Balance Sitting balance - Comments: deferred        Standing balance comment: deferred                           ADL either performed or assessed with clinical judgement   ADL Overall ADL's : Needs assistance/impaired Eating/Feeding: Set up;Bed level Eating/Feeding Details (indicate cue type and reason): HOB elevated, setup to open condiments, open lids d/t decreased FM strength. Grooming: Wash/dry face;Oral care;Minimal assistance;Bed level Grooming Details (indicate cue type and reason): HOB elevated, MIN A to open toothpaste and hold cups to rinse/spit. MIN verbal cues for thorough completion. Upper Body Bathing: Minimal assistance;Moderate assistance;Sitting Upper Body Bathing Details (indicate cue type and reason): based on clinical observation Lower Body Bathing: Maximal assistance;Bed level Lower Body Bathing Details (indicate cue type and reason): using rolling technique. Upper Body Dressing : Minimal assistance;Bed level Upper Body Dressing Details (indicate cue type and reason): to don gown to front side.       Toilet Transfer Details (indicate cue type and reason): Not attempted at this time.   Toileting - Clothing Manipulation Details (indicate cue type and reason): not attempted at this time.   Tub/Shower Transfer Details (indicate cue type and reason): NA-pt does not t/f to shower at baseline. Functional mobility during ADLs: (deferred on OT eval, not appropriate at this time as pt does not feel she can even tolerate sitting up for a second time today (sat up on PT evaluation). In addition, HR fluctuated with sup<>sit with PT.)       Vision Patient Visual Report: No change from baseline  Perception     Praxis      Pertinent Vitals/Pain Pain Assessment: No/denies pain     Hand Dominance Right   Extremity/Trunk Assessment Upper Extremity Assessment Upper Extremity Assessment: Generalized weakness;RUE deficits/detail;LUE deficits/detail RUE Deficits / Details: 3-/5 shoulder, 3+/5 elbow and  grip RUE Coordination: decreased fine motor LUE Deficits / Details: 3-/5 shoulder, 3+/5 elbow and grip LUE Coordination: decreased fine motor   Lower Extremity Assessment Lower Extremity Assessment: Generalized weakness   Cervical / Trunk Assessment Cervical / Trunk Assessment: Kyphotic   Communication Communication Communication: No difficulties   Cognition Arousal/Alertness: Lethargic Behavior During Therapy: WFL for tasks assessed/performed Overall Cognitive Status: No family/caregiver present to determine baseline cognitive functioning                                 General Comments: Pt with moderate eye opening and visual attention during OT evaluation. Pt oriented to self and location, but unsure of month, year, or situation.   General Comments       Exercises Other Exercises Other Exercises: OT facilitates education re: role of OT, but pt with limited reception d/t lethargy and current cogntiive level.   Shoulder Instructions      Home Living Family/patient expects to be discharged to:: Private residence Living Arrangements: Children Available Help at Discharge: Family;Personal care attendant;Other (Comment) Type of Home: Mobile home Home Access: Ramped entrance     Home Layout: One level     Bathroom Shower/Tub: Other (comment)(states she sponge bathes seated EOB with assistance from her daughters. States she is able to stand for peri area.)         Home Equipment: Walker - 2 wheels;Cane - single point;Bedside commode;Wheelchair - manual   Additional Comments: Pt A&O x2, possibly questionable historian. States her dtrs assist with more extensive BADLs such as bathing and dressing as well as ADL transfers (w/c<>bed, w/c<>BSC). States her dtrs push her in w/c. States she lives in mobile home with ramped entrance.      Prior Functioning/Environment Level of Independence: Needs assistance  Gait / Transfers Assistance Needed: w/c for fxl mobility  within the household with assist from daughters to propel. ADL's / Homemaking Assistance Needed: pt reports: Daughters assist with more extensive BADLs such as bathing, dressing and toileting and assist with all IADLs including meal prep, cleaning, getting groceries, transportation.            OT Problem List: Decreased strength;Decreased range of motion;Decreased activity tolerance;Impaired balance (sitting and/or standing);Decreased knowledge of use of DME or AE;Cardiopulmonary status limiting activity;Obesity      OT Treatment/Interventions: Self-care/ADL training;Therapeutic exercise;DME and/or AE instruction;Therapeutic activities;Patient/family education;Balance training    OT Goals(Current goals can be found in the care plan section) Acute Rehab OT Goals Patient Stated Goal: return home OT Goal Formulation: With patient Time For Goal Achievement: 04/04/19 Potential to Achieve Goals: Good  OT Frequency: Min 1X/week   Barriers to D/C:            Co-evaluation              AM-PAC OT "6 Clicks" Daily Activity     Outcome Measure Help from another person eating meals?: A Little Help from another person taking care of personal grooming?: A Little Help from another person toileting, which includes using toliet, bedpan, or urinal?: A Lot Help from another person bathing (including washing, rinsing, drying)?: A Lot Help from another person to put  on and taking off regular upper body clothing?: A Little Help from another person to put on and taking off regular lower body clothing?: A Lot 6 Click Score: 15   End of Session    Activity Tolerance: Patient limited by lethargy Patient left: in bed;with call bell/phone within reach;with bed alarm set  OT Visit Diagnosis: Muscle weakness (generalized) (M62.81)                Time: 1719-1750 OT Time Calculation (min): 31 min Charges:  OT General Charges $OT Visit: 1 Visit OT Evaluation $OT Eval Moderate Complexity: 1  Mod OT Treatments $Self Care/Home Management : 8-22 mins  Rejeana Brock, MS, OTR/L ascom [phone] or [phone] 03/21/19, 7:11 PM

## 2019-03-21 NOTE — Consult Note (Addendum)
Cardiology Consultation:   Laurie Ross ID: Laurie Ross MRN: [id number]; DOB: January 06, 1941  Admit date: 03/20/2019 Date of Consult: 03/21/2019  Primary Care Provider: [facility] Primary Cardiologist: New to [facility] Physician requesting consult Dr. Lorretta Harp Reason for consult: atrial fibrillation  Laurie Ross Profile:   Laurie Ross is a 78 y.o. female with a hx of diabetes, anemia, chronic kidney disease, COPD, hypertension, hyperlipidemia presenting with hypoglycemia, cardiology consulted for atrial fibrillation  History of Present Illness:   Laurie Ross was found by family to be lethargic, EMS called. EMS noted Laurie Ross had decreased responsiveness hypoglycemic in the 30s.   history of hypoglycemia in the past.    Laurie Ross is  poor historian, lives with her 2 daughters who care for her and dose her medications per Laurie Ross.  Family reports that they found her on her stomach on the floor, think that she was getting up to eat something  Note indicating that she took her routine dose of insulin/Lantus yesterday evening, She has had poor appetite since she lost one of her sisters last month  Laurie Ross is on Lantus insulin each evening.    Laurie Ross received D10 in route to the hospital recheck upon arrival fingerstick of 110.    In the emergency room she was awake alert mentating relatively well, oriented   CT of the head was negative   Hypertensive 172/88 in the emergency room respirations 29 saturations 93% EKG on arrival atrial fibrillation rate 110 bpm It appears that she is on Cardizem 360 mg daily as outpatient  In the emergency room blood glucose dropped to 20, given amp D50 Sugar again dropped to 56 Was placed on 5% dextrose and started on antibiotics to treat left lower extremity cellulitis  On my examination this morning she denied any symptoms, no complaints, Ate a little bit of ice cream but nothing else on her lunch tray Mashed potatoes [place] gravy  most of her meal was left on touched   Heart Pathway Score:     Past Medical History:  Diagnosis Date  . Anemia   . Arthritis   . Asthma   . CHF (congestive heart failure) (HCC)   . Chronic kidney disease    stage III  . COPD (chronic obstructive pulmonary disease) (HCC)    occasional home O2 use  . Depression   . Diabetes mellitus without complication (HCC)   . Dysrhythmia    bradycardia  . GERD (gastroesophageal reflux disease)   . Glaucoma   . Hx of Clostridium difficile infection March 2016  . Hyperlipidemia   . Hypertension   . Hypothyroidism   . Obstructive sleep apnea   . Sciatica   . Venous insufficiency     Past Surgical History:  Procedure Laterality Date  . BREAST BIOPSY    . CATARACT EXTRACTION W/PHACO Right 03/23/2016   Procedure: CATARACT EXTRACTION PHACO AND INTRAOCULAR LENS PLACEMENT (IOC);  Surgeon: Lockie Mola, MD;  [facility];  Service: Ophthalmology;  Laterality: Right;  DIABETIC - insulin and oral meds PLEASE LEAVE PT 2ND     Home Medications:  Prior to Admission medications   Medication Sig Start Date End Date Taking? Authorizing Provider  acetaminophen (TYLENOL) 650 MG CR tablet Take 1,300 mg by mouth 2 (two) times a day.   Yes [provider]  albuterol (PROVENTIL) (2.5 MG/3ML) 0.083% nebulizer solution Take 2.5 mg by nebulization every 4 (four) hours as needed for wheezing or shortness of breath.    Yes [provider]  aspirin EC 81 MG tablet Take 81 mg by mouth daily.   Yes [provider]  brimonidine (ALPHAGAN) 0.2 % ophthalmic solution Place 1 drop into the right eye 3 (three) times daily.   Yes [provider]  budesonide (PULMICORT) 0.25 MG/2ML nebulizer solution Take 0.25 mg by nebulization 2 (two) times daily.   Yes [provider]  Calcium Carb-Cholecalciferol 600-400 MG-UNIT TABS Take 1 tablet by mouth 2 (two) times a day.   Yes [provider]  calcium  carbonate (TUMS - DOSED IN MG ELEMENTAL CALCIUM) 500 MG chewable tablet Chew 1 tablet by mouth every 6 (six) hours as needed for indigestion or heartburn.    Yes [provider]  cetirizine (ZYRTEC) 5 MG tablet Take 5 mg by mouth at bedtime.    Yes [provider]  diltiazem (CARDIZEM CD) 360 MG 24 hr capsule Take 360 mg by mouth daily.   Yes [provider]  docusate sodium (COLACE) 100 MG capsule Take 100 mg by mouth daily.   Yes [provider]  dorzolamide-timolol (COSOPT) 22.3-6.8 MG/ML ophthalmic solution Place 1 drop into both eyes 2 (two) times daily.   Yes [provider]  ferrous sulfate 325 (65 FE) MG tablet Take 325 mg by mouth every Monday, Wednesday, and Friday.    Yes [provider]  fluticasone (FLONASE) 50 MCG/ACT nasal spray Place 2 sprays into both nostrils daily.   Yes [provider]  furosemide (LASIX) 20 MG tablet Take 20 mg by mouth daily.   Yes [provider]  glucose 4 GM chewable tablet Chew 1 tablet by mouth as needed for low blood sugar.   Yes [provider]  insulin glargine (LANTUS) 100 UNIT/ML injection Inject 0.15 mLs (15 Units total) into the skin at bedtime. Laurie Ross taking differently: Inject 35 Units into the skin at bedtime.  10/23/18  Yes Gladstone Lighter, MD  latanoprost (XALATAN) 0.005 % ophthalmic solution Place 1 drop into both eyes at bedtime.   Yes [provider]  levothyroxine (SYNTHROID, LEVOTHROID) 50 MCG tablet Take 50 mcg by mouth daily.   Yes [provider]  loperamide (IMODIUM) 2 MG capsule Take 2 mg by mouth as needed for diarrhea or loose stools (max 4 capsules in 24 hours).    Yes [provider]  omeprazole (PRILOSEC) 20 MG capsule Take 20 mg by mouth daily.   Yes [provider]  ondansetron (ZOFRAN) 4 MG tablet Take 4 mg by mouth every 8 (eight) hours as needed for nausea or vomiting.   Yes [provider]   prednisoLONE acetate (PRED FORTE) 1 % ophthalmic suspension Place 1 drop into the right eye 2 (two) times a day.   Yes [provider]  sertraline (ZOLOFT) 100 MG tablet Take 100 mg by mouth at bedtime.    Yes [provider]  sertraline (ZOLOFT) 50 MG tablet Take 50 mg by mouth at bedtime.   Yes [provider]  torsemide (DEMADEX) 10 MG tablet Take 10 mg by mouth daily.   Yes [provider]  traZODone (DESYREL) 100 MG tablet Take 100 mg by mouth at bedtime.   Yes [provider]  valACYclovir (VALTREX) 500 MG tablet Take 500 mg by mouth daily.   Yes [provider]  Vitamins A & D (VITAMIN A & D) ointment Apply 1 application topically 4 (four) times daily as needed for dry skin.    Yes [provider]  Zinc  Oxide 13 % CREA Apply 1 application topically 3 (three) times daily. After toileting   Yes [provider]    Inpatient Medications: Scheduled Meds: . aspirin EC  81 mg Oral Daily  . docusate sodium  100 mg Oral Daily  . enoxaparin (LOVENOX) injection  40 mg Subcutaneous Q24H  . ferrous sulfate  325 mg Oral Q M,W,F  . furosemide  40 mg Intravenous Q12H  . insulin aspart  0-6 Units Subcutaneous TID WC  . levothyroxine  50 mcg Oral Daily  . mouth rinse  15 mL Mouth Rinse BID  . pantoprazole  40 mg Oral Daily  . sertraline  150 mg Oral QHS  . traZODone  100 mg Oral QHS  . valACYclovir  500 mg Oral Daily   Continuous Infusions: . cefTRIAXone (ROCEPHIN)  IV 1 g (03/21/19 0923)  . diltiazem (CARDIZEM) infusion     PRN Meds: acetaminophen **OR** acetaminophen, calcium carbonate, loperamide, senna-docusate  Allergies:    Allergies  Allergen Reactions  . Penicillins Itching    Has Laurie Ross had a PCN reaction causing immediate rash, facial/tongue/throat swelling, SOB or lightheadedness with hypotension: No Has Laurie Ross had a PCN reaction causing severe rash involving mucus membranes or skin necrosis: No Has  Laurie Ross had a PCN reaction that required hospitalization No Has Laurie Ross had a PCN reaction occurring within the last 10 years: No If all of the above answers are "NO", then may proceed with Cephalosporin use.   . Codeine Other (See Comments)    Unknown reaction  . Glucophage [Metformin Hcl] Other (See Comments)    Unknown reaction  . Loratadine Other (See Comments)    Unknown reaction    Social History:   Social History   Socioeconomic History  . Marital status: Married    Spouse name: Not on file  . Number of children: Not on file  . Years of education: Not on file  . Highest education level: Not on file  Occupational History  . Not on file  Social Needs  . Financial resource strain: Not on file  . Food insecurity    Worry: Not on file    Inability: Not on file  . Transportation needs    Medical: Not on file    Non-medical: Not on file  Tobacco Use  . Smoking status: Never Smoker  . Smokeless tobacco: Never Used  Substance and Sexual Activity  . Alcohol use: No  . Drug use: No  . Sexual activity: Never  Lifestyle  . Physical activity    Days per week: Not on file    Minutes per session: Not on file  . Stress: Not on file  Relationships  . Social Musician on phone: Not on file    Gets together: Not on file    Attends religious service: Not on file    Active member of club or organization: Not on file    Attends meetings of clubs or organizations: Not on file    Relationship status: Not on file  . Intimate partner violence    Fear of current or ex partner: Not on file    Emotionally abused: Not on file    Physically abused: Not on file    Forced sexual activity: Not on file  Other Topics Concern  . Not on file  Social History Narrative  . Not on file    Family History:    Family History  Problem Relation Age of Onset  . Breast cancer  Mother   . Heart disease Father      ROS:  Please see the history of present illness.  Review of  Systems  Constitutional: Negative.   HENT: Negative.   Respiratory: Negative.   Cardiovascular: Negative.   Gastrointestinal: Negative.   Musculoskeletal: Negative.   Neurological: Negative.   Psychiatric/Behavioral: Negative.   All other systems reviewed and are negative.   Physical Exam/Data:   Vitals:   03/21/19 0845 03/21/19 1112 03/21/19 1113 03/21/19 1209  BP: (!) 156/76 (!) 159/89  105/79  Pulse: (!) 135 84 (!) 149 (!) 41  Resp:  (!) 38  (!) 35  Temp: 97.7 F (36.5 C) 98.4 F (36.9 C)  98.6 F (37 C)  TempSrc: Oral Oral  Oral  SpO2: (!) 89% 95% 95% (!) 79%  Weight:      Height:        Intake/Output Summary (Last 24 hours) at 03/21/2019 1440 Last data filed at 03/21/2019 [id number] Gross per 24 hour  Intake 1431.94 ml  Output 150 ml  Net 1281.94 ml   Last 3 Weights 03/21/2019 03/20/2019 10/21/2018  Weight (lbs) 201 lb 1 oz 178 lb 9.2 oz 178 lb 8 oz  Weight (kg) 91.2 kg 81 kg 80.967 kg     Body mass index is 37.99 kg/m.  General:  Well nourished, well developed, in no acute distress HEENT: normal Lymph: no adenopathy Neck: no JVD Endocrine:  No thryomegaly Vascular: No carotid bruits; FA pulses 2+ bilaterally without bruits  Cardiac:  normal S1, S2; RRR; no murmur  Lungs:  clear to auscultation bilaterally, no wheezing, rhonchi or rales  Abd: soft, nontender, no hepatomegaly  Ext: no edema Musculoskeletal:  No deformities, BUE and BLE strength normal and equal Skin: warm and dry  Neuro:  CNs 2-12 intact, no focal abnormalities noted Psych:  Normal affect   EKG:  The EKG was personally reviewed and demonstrates:   Shows atrial fibrillation rate 110 bpm PVCs no significant ST-T wave changes  Telemetry:  Telemetry was personally reviewed and demonstrates: Atrial fibrillation  Relevant CV Studies: Echocardiogram reviewed personally by myself Normal LV function, severely elevated right heart pressures/pulmonary hypertension, pleural effusion 8 cm on the left   Laboratory Data:  High Sensitivity Troponin:  No results for input(s): TROPONINIHS in the last 720 hours.   Chemistry Recent Labs  Lab 03/20/19 0711 03/21/19 0549  NA 142 142  K 3.2* 3.8  CL 105 106  CO2 29 29  GLUCOSE 70 248*  BUN 28* 22  CREATININE 1.15* 1.05*  CALCIUM 8.8* 8.3*  GFRNONAA 45* 50*  GFRAA 52* 58*  ANIONGAP 8 7    Recent Labs  Lab 03/20/19 0711 03/21/19 0549  PROT 6.4* 5.4*  ALBUMIN 3.0* 2.5*  AST 19 16  ALT 17 16  ALKPHOS 79 64  BILITOT 0.8 0.6   Hematology Recent Labs  Lab 03/20/19 0711 03/21/19 0549  WBC 15.4* 8.3  RBC 3.97 3.52*  HGB 13.0 11.4*  HCT 40.4 34.5*  MCV 101.8* 98.0  MCH 32.7 32.4  MCHC 32.2 33.0  RDW 14.8 14.6  PLT 119* 85*   BNP Recent Labs  Lab 03/20/19 1638  BNP 453.0*    DDimer No results for input(s): DDIMER in the last 168 hours.   Radiology/Studies:  Ct Head Wo Contrast  Result Date: 03/20/2019 CLINICAL DATA:  Unwitnessed fall. EXAM: CT HEAD WITHOUT CONTRAST TECHNIQUE: Contiguous axial images were obtained from the base of the skull through the vertex without intravenous contrast.  COMPARISON:  October 21, 2018. FINDINGS: Brain: Mild chronic ischemic white matter disease is noted. No mass effect or midline shift is noted. Ventricular size is within normal limits. There is no evidence of mass lesion, hemorrhage or acute infarction. Vascular: No hyperdense vessel or unexpected calcification. Skull: Normal. Negative for fracture or focal lesion. Sinuses/Orbits: No acute finding. Other: None. IMPRESSION: Mild chronic ischemic white matter disease. No acute intracranial abnormality seen. Electronically Signed   By: Lupita Raider M.D.   On: 03/20/2019 09:45   Lower Extremity Dopplers-[facility]  Result Date: 03/20/2019 CLINICAL DATA:  Edema and pain. EXAM: BILATERAL LOWER EXTREMITY VENOUS DOPPLER ULTRASOUND TECHNIQUE: Gray-scale sonography with compression, as well as color and duplex ultrasound, were performed to evaluate the  deep venous system from the level of the common femoral vein through the popliteal and proximal calf veins. Technologist describes technically limited exam due to swelling and body habitus. COMPARISON:  None FINDINGS: Normal compressibility of the common femoral, superficial femoral, and popliteal veins, as well as the proximal calf veins. No filling defects to suggest DVT on grayscale or color Doppler imaging. Doppler waveforms show normal direction of venous flow, normal respiratory phasicity and response to augmentation. Bilateral calf subcutaneous edema, right greater than left. IMPRESSION: No femoropopliteal and no calf DVT in the visualized calf veins. If clinical symptoms are inconsistent or if there are persistent or worsening symptoms, further imaging (possibly involving the iliac veins) may be warranted. Electronically Signed   By: Corlis Leak M.D.   On: 03/20/2019 14:00   Dg Chest Port 1 View  Result Date: 03/20/2019 CLINICAL DATA:  Fall EXAM: PORTABLE CHEST 1 VIEW COMPARISON:  October 21, 2018 FINDINGS: Small bilateral pleural effusions and bibasilar atelectasis/consolidation. Pulmonary vascular congestion and fissural fluid. Stable cardiomediastinal contours. IMPRESSION: Pulmonary vascular congestion, small bilateral pleural effusions, and bibasilar atelectasis/consolidation. Electronically Signed   By: Guadlupe Spanish M.D.   On: 03/20/2019 11:20    Assessment and Plan:   1.  Hypoglycemia, in setting of diabetes on insulin Anorexia at home, insulin has not been adjusted Hypoglycemia down to 30 Started on 5% dextrose to maintain sugars Slowly improving  2.  Atrial fibrillation, with RVR On arrival on EKG, has persisted Timing of onset unclear -Appears she is on Cardizem infusion Was given Lasix IV x2 doses -Could start heparin infusion with transition to NOACs at discharge -We will need help from Laurie Ross's daughters to ensure with compliance of her medications also need to minimize falls    3.  Pulmonary edema/acute diastolic CHF/pleural effusions Likely secondary to atrial fibrillation Normal LV function, Presentation consistent with diastolic dysfunction/diastolic CHF Elevated BNP 453 Chest x-ray small pleural effusions, vascular congestion Agree with Lasix IV x2 then reevaluate --Echocardiogram with pleural effusions, 8 cm on the left Severely elevated right heart pressures --We will likely need to continue IV Lasix with close monitoring of renal function    Total encounter time more than 110 minutes  Greater than 50% was spent in counseling and coordination of care with the Laurie Ross   For questions or updates, please contact [facility] Please consult [email or web address] for contact info under     Signed, Julien Nordmann, MD  03/21/2019 2:40 PM

## 2019-03-21 NOTE — Evaluation (Addendum)
Physical Therapy Evaluation Patient Details Name: Laurie Ross MRN: [id number] DOB: 1940-06-12 Today's Date: 03/21/2019   History of Present Illness  Laurie Ross is a 78 y.o. female with medical history significant of insulin-dependent diabetes mellitus type 2, hypothyroidism, obstructive sleep apnea, COPD, HTN presented to the hospital ED by EMS 03/20/2019 after being found on the floor. She was admitted to the hospital with hypoglycemia, atrial fibrillation with RVR, pulmonary edema/acute diastolic CHF/pleural effusions.    Clinical Impression  Patient drowsy and oriented only to self and location. Reported she lives with her two daughters in a mobile home with ramped entrance and handrail. Prior to hospitalization required assistance with ADLs and IADLs and used a w/c with assistance for mobility. Upon physical therapy evaluation, patient is limited in participation by afib and tachycardia worsening with mobility. SpO2 at 83% upon arrival when one nasal cannula out but improved to 89-91% with reposition of nasal cannula on 3L/min. Patient required min A to complete supine to sit and max A for scooting in seated position, min A for sit to supine and max A +2 to scoot up in bed. Transfers and ambulation not attempted due to afib and tachycardia ranging up to 136 bpm with mobility. Patient appears to have experienced a decline in functional independence/mobility and would benefit from short term rehab prior to returning home. Patient would benefit from physical therapy to address impairments and functional limitations (see PT Problem List below) to work towards stated goals and return to PLOF or maximal functional independence.      Follow Up Recommendations SNF;Supervision/Assistance - 24 hour    Equipment Recommendations  None recommended by PT    Recommendations for Other Services OT consult     Precautions / Restrictions Precautions Precautions: Fall Restrictions Weight Bearing  Restrictions: No      Mobility  Bed Mobility Overal bed mobility: Needs Assistance Bed Mobility: Supine to Sit;Sit to Supine     Supine to sit: Min assist;Max assist;HOB elevated Sit to supine: Min assist;Max assist;+2 for physical assistance   General bed mobility comments: Patient completed supine<>sit with min A to support trunk. Required max A to scoot towards edge of bed and max A +2 to scoot up in bed using draw sheet.  Transfers                 General transfer comment: Did not attempt transfer second to HR going up to 136 which is nearly max HR for patient. Returned patient to bed after a few minutes seated at edge of bed due to poorly controlled heart rate in Afib (based on telemetry).  Ambulation/Gait             General Gait Details: did not attempt at this time due to afib and tachycardia  Stairs            Wheelchair Mobility    Modified Rankin (Stroke Patients Only)       Balance Overall balance assessment: Needs assistance Sitting-balance support: Bilateral upper extremity supported;Feet unsupported Sitting balance-Leahy Scale: Fair Sitting balance - Comments: Able to sit at edge of bed with feet unsupported for several seconds.       Standing balance comment: deferred due to tachycardia                             Pertinent Vitals/Pain Pain Assessment: No/denies pain    Home Living Family/patient expects to be discharged to:: Private  residence Living Arrangements: Children   Type of Home: [place]: [place]: Environmental consultant - 2 wheels;Cane - single point;Bedside commode;Wheelchair - manual Additional Comments: Patient oriented to self and location but confused about situation and time. May not be reliable historian. States she lives with her 2 daughters in a trailer with a ramped entrance with two handrails. She states she takes sponge baths and uses a w/c that  someone else pushes for mobility. She gets assistance with transfers. .    Prior Function Level of Independence: Needs assistance   Gait / Transfers Assistance Needed: Patient states she uses w/c that someone else pushes for mobility. She states she gets assistance to transfer to the w/c.  ADL's / Homemaking Assistance Needed: Requires assist by family and caregiver for ADLs/IADLs        Hand Dominance   Dominant Hand: Right    Extremity/Trunk Assessment   Upper Extremity Assessment Upper Extremity Assessment: Generalized weakness    Lower Extremity Assessment Lower Extremity Assessment: Generalized weakness    Cervical / Trunk Assessment Cervical / Trunk Assessment: Kyphotic  Communication   Communication: No difficulties  Cognition Arousal/Alertness: Lethargic Behavior During Therapy: Flat affect Overall Cognitive Status: No family/caregiver present to determine baseline cognitive functioning                                 General Comments: Patient is sleepy and breathless. Answers questions with prolonged processing time. Oriented to self and location only.      General Comments      Exercises     Assessment/Plan    PT Assessment Patient needs continued PT services  PT Problem List Decreased strength;Decreased balance;Decreased cognition;Decreased knowledge of precautions;Decreased mobility;Decreased knowledge of use of DME;Obesity;Decreased activity tolerance;Decreased safety awareness;Decreased skin integrity       PT Treatment Interventions DME instruction;Functional mobility training;Balance training;Patient/family education;Gait training;Therapeutic activities;Neuromuscular re-education;Therapeutic exercise;Cognitive remediation;Wheelchair mobility training    PT Goals (Current goals can be found in the Care Plan section)  Acute Rehab PT Goals Patient Stated Goal: return home PT Goal Formulation: With patient Time For Goal Achievement:  04/04/19 Potential to Achieve Goals: Fair    Frequency Min 2X/week   Barriers to discharge Decreased caregiver support level of care patient requires    Co-evaluation               AM-PAC PT "6 Clicks" Mobility  Outcome Measure Help needed turning from your back to your side while in a flat bed without using bedrails?: A Little Help needed moving from lying on your back to sitting on the side of a flat bed without using bedrails?: A Lot Help needed moving to and from a bed to a chair (including a wheelchair)?: A Lot Help needed standing up from a chair using your arms (e.g., wheelchair or bedside chair)?: A Lot Help needed to walk in hospital room?: Total Help needed climbing 3-5 steps with a railing? : Total 6 Click Score: 11    End of Session Equipment Utilized During Treatment: Oxygen(3L/min) Activity Tolerance: Treatment limited secondary to medical complications (Comment);Patient limited by fatigue(tachicardia and afib; HR climbed to 136 bpm close to age predicted HR max) Patient left: in bed;with call bell/phone within reach;with bed alarm set Nurse Communication: Mobility status(heart rate response, SpO2) PT Visit Diagnosis: Muscle weakness (generalized) (M62.81);Difficulty in walking, not elsewhere classified (R26.2)  Time: [id number] PT Time Calculation (min) (ACUTE ONLY): 28 min   Charges:   PT Evaluation $PT Eval Moderate Complexity: 1 Mod PT Treatments $Therapeutic Activity: 8-22 mins        Luretha MurphySara R. Ilsa IhaSnyder, PT, DPT 03/21/19, 4:53 PM

## 2019-03-22 ENCOUNTER — Inpatient Hospital Stay: Payer: Medicare (Managed Care)

## 2019-03-22 DIAGNOSIS — J9621 Acute and chronic respiratory failure with hypoxia: Secondary | ICD-10-CM

## 2019-03-22 DIAGNOSIS — I4891 Unspecified atrial fibrillation: Secondary | ICD-10-CM

## 2019-03-22 DIAGNOSIS — J449 Chronic obstructive pulmonary disease, unspecified: Secondary | ICD-10-CM

## 2019-03-22 DIAGNOSIS — I5033 Acute on chronic diastolic (congestive) heart failure: Secondary | ICD-10-CM

## 2019-03-22 DIAGNOSIS — N1831 Chronic kidney disease, stage 3a: Secondary | ICD-10-CM

## 2019-03-22 DIAGNOSIS — E1121 Type 2 diabetes mellitus with diabetic nephropathy: Secondary | ICD-10-CM

## 2019-03-22 DIAGNOSIS — J90 Pleural effusion, not elsewhere classified: Secondary | ICD-10-CM

## 2019-03-22 LAB — BASIC METABOLIC PANEL
Anion gap: 11 (ref 5–15)
BUN: 25 mg/dL — ABNORMAL HIGH (ref 8–23)
CO2: 27 mmol/L (ref 22–32)
Calcium: 8.6 mg/dL — ABNORMAL LOW (ref 8.9–10.3)
Chloride: 105 mmol/L (ref 98–111)
Creatinine, Ser: 1.03 mg/dL — ABNORMAL HIGH (ref 0.44–1.00)
GFR calc Af Amer: 60 mL/min — ABNORMAL LOW (ref >60)
GFR calc non Af Amer: 52 mL/min — ABNORMAL LOW (ref >60)
Glucose, Bld: 209 mg/dL — ABNORMAL HIGH (ref 70–99)
Potassium: 3.8 mmol/L (ref 3.5–5.1)
Sodium: 143 mmol/L (ref 135–145)

## 2019-03-22 LAB — CBC
HCT: 35.6 % — ABNORMAL LOW (ref 36.0–46.0)
Hemoglobin: 11.8 g/dL — ABNORMAL LOW (ref 12.0–15.0)
MCH: 32.7 pg (ref 26.0–34.0)
MCHC: 33.1 g/dL (ref 30.0–36.0)
MCV: 98.6 fL (ref 80.0–100.0)
Platelets: 106 10*3/uL — ABNORMAL LOW (ref 150–400)
RBC: 3.61 MIL/uL — ABNORMAL LOW (ref 3.87–5.11)
RDW: 14.6 % (ref 11.5–15.5)
WBC: 9.4 10*3/uL (ref 4.0–10.5)
nRBC: 0 % (ref 0.0–0.2)

## 2019-03-22 LAB — AMYLASE, PLEURAL OR PERITONEAL FLUID: Amylase, Fluid: 5 U/L

## 2019-03-22 LAB — URINALYSIS, COMPLETE (UACMP) WITH MICROSCOPIC
Bilirubin Urine: NEGATIVE
Glucose, UA: NEGATIVE mg/dL
Ketones, ur: NEGATIVE mg/dL
Leukocytes,Ua: NEGATIVE
Nitrite: NEGATIVE
Protein, ur: 30 mg/dL — AB
Specific Gravity, Urine: 1.01 (ref 1.005–1.030)
pH: 5 (ref 5.0–8.0)

## 2019-03-22 LAB — GLUCOSE, CAPILLARY
Glucose-Capillary: 141 mg/dL — ABNORMAL HIGH (ref 70–99)
Glucose-Capillary: 178 mg/dL — ABNORMAL HIGH (ref 70–99)
Glucose-Capillary: 187 mg/dL — ABNORMAL HIGH (ref 70–99)
Glucose-Capillary: 212 mg/dL — ABNORMAL HIGH (ref 70–99)
Glucose-Capillary: 258 mg/dL — ABNORMAL HIGH (ref 70–99)
Glucose-Capillary: 277 mg/dL — ABNORMAL HIGH (ref 70–99)

## 2019-03-22 LAB — PROTEIN, PLEURAL OR PERITONEAL FLUID: Total protein, fluid: <3 g/dL

## 2019-03-22 LAB — BODY FLUID CELL COUNT WITH DIFFERENTIAL
Eos, Fluid: 3 %
Lymphs, Fluid: 48 %
Monocyte-Macrophage-Serous Fluid: 25 %
Neutrophil Count, Fluid: 24 %
Total Nucleated Cell Count, Fluid: 26 cu mm

## 2019-03-22 LAB — BLOOD GAS, ARTERIAL
Acid-Base Excess: 3.6 mmol/L — ABNORMAL HIGH (ref 0.0–2.0)
Bicarbonate: 31.7 mmol/L — ABNORMAL HIGH (ref 20.0–28.0)
Delivery systems: POSITIVE
Expiratory PAP: 6
FIO2: 0.35
Inspiratory PAP: 12
O2 Saturation: 88 %
Patient temperature: 37
pCO2 arterial: 63 mmHg — ABNORMAL HIGH (ref 32.0–48.0)
pH, Arterial: 7.31 — ABNORMAL LOW (ref 7.350–7.450)
pO2, Arterial: 60 mmHg — ABNORMAL LOW (ref 83.0–108.0)

## 2019-03-22 LAB — GLUCOSE, PLEURAL OR PERITONEAL FLUID: Glucose, Fluid: 257 mg/dL

## 2019-03-22 LAB — LACTATE DEHYDROGENASE, PLEURAL OR PERITONEAL FLUID: LD, Fluid: 63 U/L — ABNORMAL HIGH (ref 3–23)

## 2019-03-22 LAB — MAGNESIUM: Magnesium: 2 mg/dL (ref 1.7–2.4)

## 2019-03-22 LAB — BRAIN NATRIURETIC PEPTIDE: B Natriuretic Peptide: 617 pg/mL — ABNORMAL HIGH (ref 0.0–100.0)

## 2019-03-22 LAB — MRSA PCR SCREENING: MRSA by PCR: POSITIVE — AB

## 2019-03-22 LAB — HEPARIN LEVEL (UNFRACTIONATED)
Heparin Unfractionated: 0.81 IU/mL — ABNORMAL HIGH (ref 0.30–0.70)
Heparin Unfractionated: 0.14 IU/mL — ABNORMAL LOW (ref 0.30–0.70)

## 2019-03-22 MED ORDER — NEPRO/CARBSTEADY PO LIQD
237.0000 mL | Freq: Two times a day (BID) | ORAL | Status: DC
  Administered 2019-03-22 – 2019-03-25 (×3): 237 mL via ORAL

## 2019-03-22 MED ORDER — DILTIAZEM HCL 30 MG PO TABS
30.0000 mg | ORAL_TABLET | Freq: Once | ORAL | Status: AC
  Administered 2019-03-22: 30 mg via ORAL
  Filled 2019-03-22 (×2): qty 1

## 2019-03-22 MED ORDER — NITROGLYCERIN 2 % TD OINT
1.0000 [in_us] | TOPICAL_OINTMENT | Freq: Once | TRANSDERMAL | Status: AC
  Administered 2019-03-22: 12:00:00 1 [in_us] via TOPICAL
  Filled 2019-03-22: qty 1

## 2019-03-22 MED ORDER — POTASSIUM CHLORIDE CRYS ER 20 MEQ PO TBCR
20.0000 meq | EXTENDED_RELEASE_TABLET | Freq: Two times a day (BID) | ORAL | Status: DC
  Administered 2019-03-22 – 2019-03-26 (×7): 20 meq via ORAL
  Filled 2019-03-22 (×9): qty 1

## 2019-03-22 MED ORDER — FUROSEMIDE 10 MG/ML IJ SOLN
40.0000 mg | Freq: Two times a day (BID) | INTRAMUSCULAR | Status: DC
  Administered 2019-03-22: 09:00:00 40 mg via INTRAVENOUS
  Filled 2019-03-22: qty 4

## 2019-03-22 MED ORDER — NITROGLYCERIN 2 % TD OINT
1.0000 [in_us] | TOPICAL_OINTMENT | Freq: Four times a day (QID) | TRANSDERMAL | Status: DC
  Administered 2019-03-22 – 2019-03-23 (×2): 1 [in_us] via TOPICAL
  Filled 2019-03-22 (×6): qty 1

## 2019-03-22 MED ORDER — DILTIAZEM HCL 60 MG PO TABS
60.0000 mg | ORAL_TABLET | Freq: Three times a day (TID) | ORAL | Status: DC
  Administered 2019-03-22: 60 mg via ORAL
  Filled 2019-03-22: qty 1

## 2019-03-22 MED ORDER — INSULIN ASPART 100 UNIT/ML ~~LOC~~ SOLN
0.0000 [IU] | Freq: Four times a day (QID) | SUBCUTANEOUS | Status: DC
  Administered 2019-03-22: 2 [IU] via SUBCUTANEOUS
  Administered 2019-03-23: 1 [IU] via SUBCUTANEOUS
  Filled 2019-03-22 (×2): qty 1

## 2019-03-22 MED ORDER — FUROSEMIDE 10 MG/ML IJ SOLN
80.0000 mg | Freq: Three times a day (TID) | INTRAMUSCULAR | Status: AC
  Administered 2019-03-22 – 2019-03-24 (×5): 80 mg via INTRAVENOUS
  Filled 2019-03-22 (×6): qty 8

## 2019-03-22 MED ORDER — DILTIAZEM HCL 30 MG PO TABS
30.0000 mg | ORAL_TABLET | Freq: Three times a day (TID) | ORAL | Status: DC
  Administered 2019-03-22 (×2): 30 mg via ORAL
  Filled 2019-03-22 (×3): qty 1

## 2019-03-22 MED ORDER — MORPHINE SULFATE (PF) 2 MG/ML IV SOLN
INTRAVENOUS | Status: AC
  Filled 2019-03-22: qty 1

## 2019-03-22 MED ORDER — DIGOXIN 0.25 MG/ML IJ SOLN
0.2500 mg | Freq: Once | INTRAMUSCULAR | Status: AC
  Administered 2019-03-22: 0.25 mg via INTRAVENOUS
  Filled 2019-03-22 (×3): qty 2

## 2019-03-22 MED ORDER — ADULT MULTIVITAMIN W/MINERALS CH
1.0000 | ORAL_TABLET | Freq: Every day | ORAL | Status: DC
  Administered 2019-03-24 – 2019-03-26 (×3): 1 via ORAL
  Filled 2019-03-22 (×4): qty 1

## 2019-03-22 MED ORDER — CHLORHEXIDINE GLUCONATE CLOTH 2 % EX PADS
6.0000 | MEDICATED_PAD | Freq: Every day | CUTANEOUS | Status: DC
  Administered 2019-03-22 – 2019-03-26 (×3): 6 via TOPICAL

## 2019-03-22 MED ORDER — FUROSEMIDE 10 MG/ML IJ SOLN
40.0000 mg | Freq: Once | INTRAMUSCULAR | Status: AC
  Administered 2019-03-22: 40 mg via INTRAVENOUS
  Filled 2019-03-22: qty 4

## 2019-03-22 MED ORDER — MORPHINE SULFATE (PF) 2 MG/ML IV SOLN
1.0000 mg | INTRAVENOUS | Status: DC | PRN
  Administered 2019-03-22: 2 mg via INTRAVENOUS

## 2019-03-22 MED ORDER — CEPHALEXIN 500 MG PO CAPS
500.0000 mg | ORAL_CAPSULE | Freq: Four times a day (QID) | ORAL | Status: DC
  Administered 2019-03-23 – 2019-03-26 (×12): 500 mg via ORAL
  Filled 2019-03-22 (×16): qty 1

## 2019-03-22 MED ORDER — INSULIN GLARGINE 100 UNIT/ML ~~LOC~~ SOLN
5.0000 [IU] | Freq: Every day | SUBCUTANEOUS | Status: DC
  Administered 2019-03-22 – 2019-03-26 (×4): 5 [IU] via SUBCUTANEOUS
  Filled 2019-03-22 (×5): qty 0.05

## 2019-03-22 NOTE — Progress Notes (Signed)
Initial Nutrition Assessment  DOCUMENTATION CODES:   Obesity unspecified  INTERVENTION:   Nepro Shake po BID, each supplement provides 425 kcal and 19 grams protein  MVI daily   NUTRITION DIAGNOSIS:   Inadequate oral intake related to acute illness as evidenced by meal completion < 25%.  GOAL:   Patient will meet greater than or equal to 90% of their needs  MONITOR:   PO intake, Supplement acceptance, Labs, Weight trends, Skin, I & O's  REASON FOR ASSESSMENT:   Consult Assessment of nutrition requirement/status  ASSESSMENT:   78 y.o. female with history of IDDM, CKD stage II, anemia, thrombocytopenia, COPD, HTN, HLD, hypothyroidism, and OSA, new onset A. fib with RVR of uncertain chronicity, acute diastolic CHF, elevated troponin in the setting of the patient being found on the floor with significant hypoglycemia and sepsis secondary to left lower extremity cellulitis.   Pt is a poor historian; unable to obtain nutrition related history. Pt with poor appetite and oral intake in hospital. RD will add supplements and MVI to help pt meet her estimated needs. There is not a very detailed weight history in chart; pt does appear to be slightly up from her UBW currently.   Medications reviewed and include: aspirin, colace, ferrous sulfate, lasix, insulin, synthroid, protonix, KCl, ceftriaxone, heparin  Labs reviewed: BUN 25(H), creat 1.03(H), Mg 2.0 wnl BNP- 617(H)- 11/27 cbgs- 224, 233, 197, 190, 187 x 24 hrs AIC 6.0(H)- 11/25  NUTRITION - FOCUSED PHYSICAL EXAM:    Most Recent Value  Orbital Region  Mild depletion  Upper Arm Region  No depletion  Thoracic and Lumbar Region  No depletion  Buccal Region  No depletion  Temple Region  Mild depletion  Clavicle Bone Region  Mild depletion  Clavicle and Acromion Bone Region  Mild depletion  Scapular Bone Region  No depletion  Dorsal Hand  Mild depletion  Patellar Region  No depletion  Anterior Thigh Region  No depletion   Posterior Calf Region  No depletion  Edema (RD Assessment)  Mild  Hair  Reviewed  Eyes  Reviewed  Mouth  Reviewed  Skin  Reviewed  Nails  Reviewed     Diet Order:   Diet Order            DIET - DYS 1 Room service appropriate? Yes; Fluid consistency: Thin; Fluid restriction: 1500 mL Fluid  Diet effective now             EDUCATION NEEDS:   Not appropriate for education at this time  Skin:  Skin Assessment: Reviewed RN Assessment(LLE cellulitis)  Last BM:  11/25  Height:   Ht Readings from Last 1 Encounters:  03/22/19 5\' 1"  (1.549 m)    Weight:   Wt Readings from Last 1 Encounters:  03/22/19 91.8 kg    Ideal Body Weight:  47.7 kg  BMI:  Body mass index is 38.22 kg/m.  Estimated Nutritional Needs:   Kcal:  1600-1800kcal/day  Protein:  80-90/day  Fluid:  1571ml per MD  Koleen Distance MS, RD, LDN Pager #- [phone] Office#- [phone] After Hours Pager: [phone]

## 2019-03-22 NOTE — Plan of Care (Signed)
  Problem: Clinical Measurements: Goal: Ability to maintain clinical measurements within normal limits will improve Outcome: Not Progressing Patient is tachypneic, with wet lung sounds.

## 2019-03-22 NOTE — Progress Notes (Addendum)
Progress Note  Patient Name: Laurie Ross Date of Encounter: 03/22/2019  Primary Cardiologist: New to [facility] - consult by G[place]  Subjective   Remains in Afib with ventricular rates in the 90s to 110s bpm mostly with rare episode in the 130s bpm. Examined overnight with crackles with follow up BNP slightly higher around 600 and repeat CXR showing persistent pleural effusions. Given scheduled IV Lasix just after midnight this morning. Documented UOP of 27 mL for the past 24 hours net + 1.3 L for the admission, uncertain accuracy. Weight 91.2-->91.8 kg.   Dyspnea the same. No chest pain. Notes palpitations, and reports she has been told she has an "irregular heart beat" in the past. Available EKGs in Epic and Care Everywhere show sinus rhythm.   Inpatient Medications    Scheduled Meds: . aspirin EC  81 mg Oral Daily  . docusate sodium  100 mg Oral Daily  . ferrous sulfate  325 mg Oral Q M,W,F  . insulin aspart  0-6 Units Subcutaneous TID WC  . levothyroxine  50 mcg Oral Daily  . mouth rinse  15 mL Mouth Rinse BID  . pantoprazole  40 mg Oral Daily  . sertraline  150 mg Oral QHS  . traZODone  100 mg Oral QHS  . valACYclovir  500 mg Oral Daily   Continuous Infusions: . cefTRIAXone (ROCEPHIN)  IV Stopped (03/21/19 0955)  . heparin 850 Units/hr (03/22/19 0300)   PRN Meds: acetaminophen **OR** acetaminophen, calcium carbonate, loperamide, senna-docusate   Vital Signs    Vitals:   03/21/19 1713 03/21/19 2049 03/22/19 0459 03/22/19 0504  BP:  (!) 152/60  (!) 155/84  Pulse: 91 97  87  Resp:      Temp:  97.9 F (36.6 C)  (!) 97.5 F (36.4 C)  TempSrc:  Oral  Oral  SpO2: 92% 92%  95%  Weight:   91.8 kg   Height:        Intake/Output Summary (Last 24 hours) at 03/22/2019 0754 Last data filed at 03/22/2019 0459 Gross per 24 hour  Intake 222.98 ml  Output 250 ml  Net -27.02 ml   Filed Weights   03/20/19 0705 03/21/19 0500 03/22/19 0459  Weight: 81 kg 91.2 kg 91.8 kg     Telemetry    Afib with RVR with rates in the low 100s to 110s bpm mostly, rare episode of ventricular rates in the 130s bpm - Personally Reviewed  ECG    No new tracings- Personally Reviewed  Physical Exam   GEN: No acute distress. SOB with conversation on rounds this morning.  Neck: JVD difficult to assess secondary to body habitus. Cardiac: Mildly tachycardic, irregularly irregular, II/VI systolic murmur LUSB, no rubs, or gallops.  Respiratory: Diminished breath sounds bilaterally, particularly along the bases to anterior exam.  GI: Soft, nontender, non-distended.   MS: No edema; No deformity. Neuro:  Alert and oriented x 3; Nonfocal.  Psych: Normal affect.  Labs    Chemistry Recent Labs  Lab 03/20/19 0711 03/21/19 0549 03/22/19 0555  NA 142 142 143  K 3.2* 3.8 3.8  CL 105 106 105  CO2 29 29 27   GLUCOSE 70 248* 209*  BUN 28* 22 25*  CREATININE 1.15* 1.05* 1.03*  CALCIUM 8.8* 8.3* 8.6*  PROT 6.4* 5.4*  --   ALBUMIN 3.0* 2.5*  --   AST 19 16  --   ALT 17 16  --   ALKPHOS 79 64  --   BILITOT  0.8 0.6  --   GFRNONAA 45* 50* 52*  GFRAA 52* 58* 60*  ANIONGAP 8 7 11      Hematology Recent Labs  Lab 03/20/19 0711 03/21/19 0549 03/22/19 0442  WBC 15.4* 8.3 9.4  RBC 3.97 3.52* 3.61*  HGB 13.0 11.4* 11.8*  HCT 40.4 34.5* 35.6*  MCV 101.8* 98.0 98.6  MCH 32.7 32.4 32.7  MCHC 32.2 33.0 33.1  RDW 14.8 14.6 14.6  PLT 119* 85* 106*    Cardiac EnzymesNo results for input(s): TROPONINI in the last 168 hours. No results for input(s): TROPIPOC in the last 168 hours.   BNP Recent Labs  Lab 03/20/19 1638 03/22/19 0442  BNP 453.0* 617.0*     DDimer No results for input(s): DDIMER in the last 168 hours.   Radiology    Dg Chest 1 View  Result Date: 03/22/2019 IMPRESSION: Unchanged examination with bilateral opacities and small pleural effusions. Electronically Signed   By: Deatra RobinsonKevin  Herman M.D.   On: 03/22/2019 03:17   Ct Head Wo Contrast  Result Date:  03/20/2019 IMPRESSION: Mild chronic ischemic white matter disease. No acute intracranial abnormality seen. Electronically Signed   By: Lupita RaiderJames  Green Jr M.D.   On: 03/20/2019 09:45   Lower Extremity Dopplers-[facility]  Result Date: 03/20/2019 IMPRESSION: No femoropopliteal and no calf DVT in the visualized calf veins. If clinical symptoms are inconsistent or if there are persistent or worsening symptoms, further imaging (possibly involving the iliac veins) may be warranted. Electronically Signed   By: Corlis Leak  Hassell M.D.   On: 03/20/2019 14:00   Dg Chest Port 1 View  Result Date: 03/20/2019 IMPRESSION: Pulmonary vascular congestion, small bilateral pleural effusions, and bibasilar atelectasis/consolidation. Electronically Signed   By: Guadlupe SpanishPraneil  Patel M.D.   On: 03/20/2019 11:20    Cardiac Studies   2D echo 03/21/2019: 1. Left ventricular ejection fraction, by visual estimation, is 55 to 60%. The left ventricle has normal function. There is no left ventricular hypertrophy.  2. Left ventricular diastolic parameters are indeterminate.  3. Global right ventricle has normal systolic function.The right ventricular size is normal. No increase in right ventricular wall thickness.  4. Left atrial size was mild-moderately dilated.  5. Tricuspid valve regurgitation mild-moderate.  6. The aortic valve was not well visualized. Moderately calcified. No valve gradient measurements obtained (consider repeat study at later date for evaluation). Aortic valve regurgitation is not visualized.  7. Severely elevated pulmonary artery systolic pressure.  8. Left pleural effusion noted, 8 cm  9. The tricuspid regurgitant velocity is 4.13 m/s, and with an assumed right atrial pressure of 10 mmHg, the estimated right ventricular systolic pressure is severely elevated at 78.2 mmHg. 10. Rhythm is atrial fibrillation.  Patient Profile     78 y.o. female with history of IDDM, CKD stage II, anemia, thrombocytopenia, COPD, HTN,  HLD, hypothyroidism, and OSA who we are seeing for new onset A. fib with RVR of uncertain chronicity, acute diastolic CHF, and elevated troponin in the setting of the patient being found on the floor with significant hypoglycemia and sepsis secondary to left lower extremity cellulitis.  Assessment & Plan    1. Afib with RVR: -Of uncertain chronicity  -Remains in Afib with ventricular rates in the 90s to low 100s bpm mostly -No longer on rate controlling medications -Add short acting diltiazem 30 mg q 8 hours for rate control with plans to consolidate prior to discharge  -Pending BP/HR, could escalate or add Lopressor -Remains on heparin gtt -CHADS2VASc at least  6 (CHF, HTN, age x 2, DM, female) -Transition to Eliquis 5 mg bid prior to discharge   2. Acute diastolic CHF/pleural effusions: -CXR this morning with unchanged bilateral opacities and small pleural effusions -BNP slightly higher ~ 600 this morning -Received IV Lasix early this morning  -Add back IV Lasix 40 mg bid, pending UOP may need to escalate to q 8 hours -May need therapeutic thoracentesis  -Maintain potassium of 4.0  3. Elevated high sensitivity troponin: -Initial troponin 22 with a delta of 25, not consistent with ACS -Likely supply demand ischemia in the setting of Afib with RVR, volume overload, syncope in the setting of hypoglycemia, and sepsis secondary to cellulitis -Heparin gtt for now -ASA -Echo with preserved LVSF -Follow up as outpatient for consideration of noninvasive ischemic evaluation  For questions or updates, please contact [facility] Please consult [email or web address] for contact info under Cardiology/STEMI.    Signed, Eula Listen, PA-C [facility] Pager: [phone] 03/22/2019, 7:54 AM

## 2019-03-22 NOTE — Significant Event (Addendum)
Called to see patient just before 3 PM for increased shortness of breath, fatigue, concern for respiratory decompensation.  Patient alert, oriented to self and location.  Able to speak in short sentences.  Denies shortness of breath.  Exam. Appears much worse this afternoon.  Tachypneic, appears short of breath.  Appears critically ill. Cardiovascular tachycardic, irregular.  No change in anasarca. Respiratory, fair air movement.  No frank wheezes, rales or rhonchi.  Moderate increased respiratory effort. Psychiatric.  Oriented to self, location.  Responds appropriately to questions.  Discussed with RN and Dr. Rockey Situ at bedside.  Minimal urine output despite Lasix earlier today.  Heart rate relatively controlled.  Echocardiogram suggested significant left-sided pleural effusion.  Discussed with daughter by telephone, updated on critical status and transfer to ICU as well as other treatment plans.  Assessment/plan 78 year old woman chronic hypoxic respiratory failure, COPD, on 2 L at home, presented after hypoglycemic episode.  Found to have atrial fibrillation with rapid ventricular response, treated with rate control and anasarca, treated with Lasix.  Echocardiogram showed severe pulmonary hypertension and significant left pleural effusion.  Now with worsening respiratory failure, now with significant fatigue and mental slowing.  Secondary to acute CHF.  Despite aggressive Lasix and Foley catheter, there has been minimal urine output.  Renal function unremarkable.  Transfer to ICU.  Start BiPAP.  Check ABG.  Urgent thoracentesis discussed with interventional radiology (now complete with removal of 1 L).  Will discuss with pulmonology.  There seems little to offer if thoracentesis ineffective and diuresis not achievable.  DNR/DNI confirmed with daughter.  Addendum discussed with pulmonology, indeed this seems to be little to offer.  Discussed with daughter again.  Not a candidate for CRRT.   Daughter agrees.  Murray Hodgkins, MD [facility] [phone]

## 2019-03-22 NOTE — Consult Note (Signed)
PULMONARY / CRITICAL CARE MEDICINE  Name: Laurie Ross MRN: [id number] DOB: Feb 10, 1941    LOS: 1  Referring Provider:  Dr Irene Limbo Reason for Referral:  Acute hypoxic respiratory failure Brief patient description: 78 year old female admitted with bilateral pleural effusions, A. fib with RVR, acute on chronic diastolic heart failure, hypoglycemia, syncope and acute hypoxic respiratory failure requiring BiPAP.  PCCM consulted to assist with care  HPI: This is a 78 year old female with a medical history significant for COPD on home O2 at 2 L nasal cannula, type 2 diabetes, obstructive sleep apnea, atrial fibrillation, chronic diastolic heart failure, and hypothyroidism admitted on 03/20/2019 after presenting with hypoglycemia with a CBG of 34, poor appetite and unwitnessed fall at home.  At the ED, she continued to have low blood sugars and hypoxemia.  She was placed on supplemental oxygen and admitted to the regular floor for further management.  On 03/22/2019, patient became acutely short of breath and hence was transferred to the ICU.  PCCM was consulted to assist with care. Patient had a right thoracentesis with drainage of 950 cc of fluid from the right lung.  She remains on BiPAP and offers no complaints.  Heart rate is well controlled on p.o. diltiazem.  SIGNIFICANT EVENTS: 03/20/2019: Admitted 03/22/2019: Transferred to the ICU, right thoracentesis and started on BiPAP   Past Medical History:  Diagnosis Date  . Anemia   . Arthritis   . Asthma   . CHF (congestive heart failure) (HCC)   . Chronic kidney disease    stage III  . COPD (chronic obstructive pulmonary disease) (HCC)    occasional home O2 use  . Depression   . Diabetes mellitus without complication (HCC)   . Dysrhythmia    bradycardia  . GERD (gastroesophageal reflux disease)   . Glaucoma   . Hx of Clostridium difficile infection March 2016  . Hyperlipidemia   . Hypertension   . Hypothyroidism   . Obstructive  sleep apnea   . Sciatica   . Venous insufficiency    Past Surgical History:  Procedure Laterality Date  . BREAST BIOPSY    . CATARACT EXTRACTION W/PHACO Right 03/23/2016   Procedure: CATARACT EXTRACTION PHACO AND INTRAOCULAR LENS PLACEMENT (IOC);  Surgeon: Lockie Mola, MD;  Location: [facility];  Service: Ophthalmology;  Laterality: Right;  DIABETIC - insulin and oral meds PLEASE LEAVE PT 2ND   No current facility-administered medications on file prior to encounter.    Current Outpatient Medications on File Prior to Encounter  Medication Sig  . acetaminophen (TYLENOL) 650 MG CR tablet Take 1,300 mg by mouth 2 (two) times a day.  . albuterol (PROVENTIL) (2.5 MG/3ML) 0.083% nebulizer solution Take 2.5 mg by nebulization every 4 (four) hours as needed for wheezing or shortness of breath.   Marland Kitchen aspirin EC 81 MG tablet Take 81 mg by mouth daily.  . brimonidine (ALPHAGAN) 0.2 % ophthalmic solution Place 1 drop into the right eye 3 (three) times daily.  . budesonide (PULMICORT) 0.25 MG/2ML nebulizer solution Take 0.25 mg by nebulization 2 (two) times daily.  . Calcium Carb-Cholecalciferol 600-400 MG-UNIT TABS Take 1 tablet by mouth 2 (two) times a day.  . calcium carbonate (TUMS - DOSED IN MG ELEMENTAL CALCIUM) 500 MG chewable tablet Chew 1 tablet by mouth every 6 (six) hours as needed for indigestion or heartburn.   . cetirizine (ZYRTEC) 5 MG tablet Take 5 mg by mouth at bedtime.   Marland Kitchen diltiazem (CARDIZEM CD) 360 MG 24 hr capsule Take  360 mg by mouth daily.  Marland Kitchen. docusate sodium (COLACE) 100 MG capsule Take 100 mg by mouth daily.  . dorzolamide-timolol (COSOPT) 22.3-6.8 MG/ML ophthalmic solution Place 1 drop into both eyes 2 (two) times daily.  . ferrous sulfate 325 (65 FE) MG tablet Take 325 mg by mouth every Monday, Wednesday, and Friday.   . fluticasone (FLONASE) 50 MCG/ACT nasal spray Place 2 sprays into both nostrils daily.  . furosemide (LASIX) 20 MG tablet Take 20 mg by mouth  daily.  Marland Kitchen. glucose 4 GM chewable tablet Chew 1 tablet by mouth as needed for low blood sugar.  . insulin glargine (LANTUS) 100 UNIT/ML injection Inject 0.15 mLs (15 Units total) into the skin at bedtime. (Patient taking differently: Inject 35 Units into the skin at bedtime. )  . latanoprost (XALATAN) 0.005 % ophthalmic solution Place 1 drop into both eyes at bedtime.  Marland Kitchen. levothyroxine (SYNTHROID, LEVOTHROID) 50 MCG tablet Take 50 mcg by mouth daily.  Marland Kitchen. loperamide (IMODIUM) 2 MG capsule Take 2 mg by mouth as needed for diarrhea or loose stools (max 4 capsules in 24 hours).   Marland Kitchen. omeprazole (PRILOSEC) 20 MG capsule Take 20 mg by mouth daily.  . ondansetron (ZOFRAN) 4 MG tablet Take 4 mg by mouth every 8 (eight) hours as needed for nausea or vomiting.  . prednisoLONE acetate (PRED FORTE) 1 % ophthalmic suspension Place 1 drop into the right eye 2 (two) times a day.  . sertraline (ZOLOFT) 100 MG tablet Take 100 mg by mouth at bedtime.   . sertraline (ZOLOFT) 50 MG tablet Take 50 mg by mouth at bedtime.  . torsemide (DEMADEX) 10 MG tablet Take 10 mg by mouth daily.  . traZODone (DESYREL) 100 MG tablet Take 100 mg by mouth at bedtime.  . valACYclovir (VALTREX) 500 MG tablet Take 500 mg by mouth daily.  . Vitamins A & D (VITAMIN A & D) ointment Apply 1 application topically 4 (four) times daily as needed for dry skin.   . Zinc Oxide 13 % CREA Apply 1 application topically 3 (three) times daily. After toileting    Allergies Allergies  Allergen Reactions  . Penicillins Itching    Has patient had a PCN reaction causing immediate rash, facial/tongue/throat swelling, SOB or lightheadedness with hypotension: No Has patient had a PCN reaction causing severe rash involving mucus membranes or skin necrosis: No Has patient had a PCN reaction that required hospitalization No Has patient had a PCN reaction occurring within the last 10 years: No If all of the above answers are "NO", then may proceed with  Cephalosporin use.   . Codeine Other (See Comments)    Unknown reaction  . Glucophage [Metformin Hcl] Other (See Comments)    Unknown reaction  . Loratadine Other (See Comments)    Unknown reaction    Family History Family History  Problem Relation Age of Onset  . Breast cancer Mother   . Heart disease Father    Social History  reports that she has never smoked. She has never used smokeless tobacco. She reports that she does not drink alcohol or use drugs.  Review Of Systems: Unable to obtain a detailed review of systems as patient is currently on continuous BiPAP and somnolent  VITAL SIGNS: BP (!) 148/49 (BP Location: Right Arm)   Pulse 97   Temp 98.1 F (36.7 C) (Axillary)   Resp (!) 22   Ht 5\' 1"  (1.549 m)   Wt 89.5 kg   SpO2 98%  BMI 37.28 kg/m   HEMODYNAMICS:    VENTILATOR SETTINGS: FiO2 (%):  [3 %-35 %] 35 %  INTAKE / OUTPUT: I/O last 3 completed shifts: In: 331.5 [I.V.:231.5; IV Piggyback:100] Out: 560 [Urine:560]  PHYSICAL EXAMINATION: General: Acutely ill looking, in no distress HEENT: BiPAP in place, PERRLA, trachea midline, no JVD Neuro: Awakens to voice and touch, follows some basic commands, moves all extremities Cardiovascular: Apical pulse irregular, S1-S2, no murmur regurg or gallop, +2 pulses bilaterally, trace edema Lungs: Normal work of breathing, bilateral breath sounds, diminished in the bases with bibasilar Rales Abdomen: Nondistended, normal bowel sounds in all 4 quadrants Musculoskeletal: Positive range of motion, no joint deformity Skin: Warm and dry, diffuse erythema in left lower extremity suggestive of cellulitis  LABS:  BMET Recent Labs  Lab 03/20/19 0711 03/21/19 0549 03/22/19 0555  NA 142 142 143  K 3.2* 3.8 3.8  CL 105 106 105  CO2 29 29 27   BUN 28* 22 25*  CREATININE 1.15* 1.05* 1.03*  GLUCOSE 70 248* 209*    Electrolytes Recent Labs  Lab 03/20/19 0711 03/21/19 0549 03/22/19 0555  CALCIUM 8.8* 8.3* 8.6*   MG  --   --  2.0    CBC Recent Labs  Lab 03/20/19 0711 03/21/19 0549 03/22/19 0442  WBC 15.4* 8.3 9.4  HGB 13.0 11.4* 11.8*  HCT 40.4 34.5* 35.6*  PLT 119* 85* 106*    Coag's Recent Labs  Lab 03/21/19 0549 03/21/19 1554  APTT  --  26  INR 1.0  --     Sepsis Markers Recent Labs  Lab 03/20/19 0711  PROCALCITON <0.10    ABG Recent Labs  Lab 03/22/19 1520  PHART 7.31*  PCO2ART 63*  PO2ART 60*    Liver Enzymes Recent Labs  Lab 03/20/19 0711 03/21/19 0549  AST 19 16  ALT 17 16  ALKPHOS 79 64  BILITOT 0.8 0.6  ALBUMIN 3.0* 2.5*    Cardiac Enzymes No results for input(s): TROPONINI, PROBNP in the last 168 hours.  Glucose Recent Labs  Lab 03/21/19 1651 03/21/19 2056 03/22/19 0805 03/22/19 1151 03/22/19 1623 03/22/19 1643  GLUCAP 197* 190* 187* 212* 277* 258*    Imaging Dg Chest 1 View  Result Date: 03/22/2019 CLINICAL DATA:  Shortness of breath EXAM: CHEST  1 VIEW COMPARISON:  03/20/2019 FINDINGS: Shallow lung inflation with unchanged multifocal bilateral airspace opacities. Cardiomediastinal contours are unchanged. Small pleural effusions. IMPRESSION: Unchanged examination with bilateral opacities and small pleural effusions. Electronically Signed   By: Ulyses Jarred M.D.   On: 03/22/2019 03:17   US Renal  Result Date: 03/22/2019 CLINICAL DATA:  Oliguria. EXAM: RENAL / URINARY TRACT ULTRASOUND COMPLETE COMPARISON:  None. FINDINGS: Right Kidney: Renal measurements: 10.4 x 5.2 x 5.1 cm = volume: 143 mL . Echogenicity within normal limits. No mass or hydronephrosis visualized. Left Kidney: Renal measurements: 10.1 x 4.8 x 4.0 cm = volume: 101 mL. Echogenicity within normal limits. No mass or hydronephrosis visualized. Bladder: Decompressed secondary to Foley catheter. Other: None. IMPRESSION: Normal renal ultrasound. Electronically Signed   By: Marijo Conception M.D.   On: 03/22/2019 17:45   Dg Chest Port 1 View  Result Date: 03/22/2019 CLINICAL  DATA:  Status post thoracentesis EXAM: PORTABLE CHEST 1 VIEW COMPARISON:  03/22/2019, 2:57 a.m. FINDINGS: Status post right thoracentesis with near complete resolution of a small right pleural effusion. No significant pneumothorax. Unchanged small left pleural effusion. Unchanged diffuse interstitial opacity, likely edema. Cardiomegaly. IMPRESSION: Status post right thoracentesis with  near complete resolution of a small right pleural effusion. No significant pneumothorax. Electronically Signed   By: Lauralyn Primes M.D.   On: 03/22/2019 16:34   US Thoracentesis Asp Pleural Space W/img Guide  Result Date: 03/22/2019 INDICATION: Shortness of breath.  Pleural effusions. EXAM: ULTRASOUND GUIDED RIGHT THORACENTESIS MEDICATIONS: None. COMPLICATIONS: None immediate. PROCEDURE: An ultrasound guided thoracentesis was thoroughly discussed with the patient's daughter on the telephone. The benefits, risks, alternatives and complications were also discussed. Informed consent was obtained. Ultrasound was performed to localize and mark an adequate pocket of fluid in the right chest. The area was then prepped and draped in the normal sterile fashion. 1% Lidocaine was used for local anesthesia. Under ultrasound guidance a 6 Fr Safe-T-Centesis catheter was introduced. Thoracentesis was performed. The catheter was removed and a dressing applied. FINDINGS: A total of approximately 950 mL of yellow fluid was removed. Samples were sent to the laboratory as requested by the clinical team. IMPRESSION: Successful ultrasound guided right thoracentesis yielding 950 mL of pleural fluid. Electronically Signed   By: Richarda Overlie M.D.   On: 03/22/2019 16:22    STUDIES:  None  CULTURES: Blood cultures x2  ANTIBIOTICS: Keflex 03/22/2019>  LINES/TUBES: Peripheral IVs  DISCUSSION: 78 y/o female admitted with bilateral pleural effusions, Afib with RVR, acute on chronic diastolic heart failure, acute hypoxic respiratory failure, and  syncope due to hypoglycemia. Now s/p Rt Thora with 950cc out  ASSESSMENT / PLAN:  PULMONARY A: Bilateral pleural effusions Acute hypoxic respiratory failure requiring BiPAP-Covid 19 negative Severe pulmonary hypertension COPD on home O2 at 3 L nasal cannula P:   Now s/p right thoracentesis Continue BiPAP and titrate to nasal cannula as tolerated Gentle IV diuresis Chest x-ray in a.m. Nebulized bronchodilators and inhaled steroids  CARDIOVASCULAR A:  A. fib with RVR-unclear if this is new onset or is chronic Acute on chronic diastolic heart failure Severe pulmonary hypertension on echocardiogram P:  Cardiology following Heart rate is much controlled on p.o. diltiazem Gentle IV diuresis Cycle cardiac enzymes serial EKGs as needed Heparin infusion per cardiology Strict I's and O's monitoring  RENAL A:   Mild AKI P:   -Resolved Continue to trend renal indices Monitor and correct electrolytes  Infectious disease A:   Left lower extremity cellulitis P:   Continue Keflex p.o. as scheduled  HEMATOLOGIC A:   Thrombocytopenia Anemia of chronic disease P:  No acute bleeding.   Monitor platelets, hemoglobin and hematocrit and transfuse as needed  ENDOCRINE A:   Type 2 diabetes with episodes of hypoglycemia Hypothyroidism P:   Continue home dose of Synthroid Blood glucose monitoring with sliding scale insulin coverage Hemoglobin A1c 6.0% and TSH 2.418 Titrate insulin to patient's meal consumption Consult with diabetes coordinator prior to discharge Lantus 5 units daily  NEUROLOGIC A:   Syncope due to hypoglycemia-resolved Dementia Depression P:   Continue to monitor mental status Palliative care consult for goals of care  Best Practice: Code Status: DNR Diet: Heart healthy/carb modified diet GI prophylaxis: Protonix 40 mg daily VTE prophylaxis: SCDs/already on heparin infusion  FAMILY  - Updates: Family updated by primary service.  Will update with  any changes in treatment plan and patient condition.  Magdalene S. Tukov-Yual, ANP-BC Pulmonary and Critical Care Medicine [facility] Pager [phone] or [phone]  NB: This document was prepared using Dragon voice recognition software and may include unintentional dictation errors.    03/22/2019, 9:44 PM

## 2019-03-22 NOTE — TOC Initial Note (Signed)
Transition of Care [facility]) - Initial/Assessment Note    Patient Details  Name: Laurie Ross MRN: [id number] Date of Birth: 03/20/1941  Transition of Care [facility]) CM/SW Contact:    Beverly Sessions, RN Phone Number: 03/22/2019, 3:09 PM  Clinical Narrative:                 Patient admitted from home with syncope  Assessment completed by phone with daughter Laurie Ross  Patient lives at home with daughter Laurie Ross, another daughter, and granddaughter.   Patient has a RW, BSC, hospital O2, and chronic O2.   Per daughter patient is a 2 person assist from the bed to [facility], and remains in the [facility] during the day  Patient is followed by [facility].  PT currently recommending SNF.  Daughter states that she wishes for patient to return home  VM left for on call [facility] RN  Expected Discharge Plan: (Home with [facility])     Patient Goals and CMS Choice        Expected Discharge Plan and Services Expected Discharge Plan: (Home with [facility])   Discharge Planning Services: CM Consult   Living arrangements for the past 2 months: [place]                                      Prior Living Arrangements/Services Living arrangements for the past 2 months: Single Family Home Lives with:: Adult Children Patient language and need for interpreter reviewed:: Yes Do you feel safe going back to the place where you live?: Yes      Need for Family Participation in Patient Care: Yes (Comment) Care giver support system in place?: Yes (comment) Current home services: DME([facility]) Criminal Activity/Legal Involvement Pertinent to Current Situation/Hospitalization: No - Comment as needed  Activities of Daily Living Home Assistive Devices/Equipment: Hospital bed, Wheelchair ADL Screening (condition at time of admission) Patient's cognitive ability adequate to safely complete daily activities?: No Is the patient deaf or have difficulty hearing?: Yes Does the patient have difficulty seeing, even when wearing  glasses/contacts?: Yes Does the patient have difficulty concentrating, remembering, or making decisions?: Yes Patient able to express need for assistance with ADLs?: Yes Does the patient have difficulty dressing or bathing?: Yes Independently performs ADLs?: No Does the patient have difficulty walking or climbing stairs?: No Weakness of Legs: Both Weakness of Arms/Hands: Both  Permission Sought/Granted                  Emotional Assessment           Psych Involvement: No (comment)  Admission diagnosis:  Weakness [R53.1] Hypoglycemia [E16.2] Left leg pain [M79.605] Cellulitis, unspecified cellulitis site [L03.90] Patient Active Problem List   Diagnosis Date Noted  . Thrombocytopenia ([place]) 03/21/2019  . Atrial fibrillation with RVR ([place]) 03/21/2019  . Acute on chronic diastolic CHF (congestive heart failure) ([place]) 03/21/2019  . COPD (chronic obstructive pulmonary disease) ([place])   . Acute on chronic respiratory failure with hypoxia ([place])   . Syncope 03/20/2019  . Hypoglycemia due to insulin 03/20/2019  . Type II diabetes mellitus with renal manifestations ([place]) 03/20/2019  . CKD stage G3a/A1, GFR 45-59 and albumin creatinine ratio <30 mg/g 03/20/2019  . Cellulitis of left leg 03/20/2019  . Hypothyroidism 03/20/2019  . GERD (gastroesophageal reflux disease) 03/20/2019  . Hypoglycemia 03/20/2019  . Hypokalemia 10/21/2018  . Diarrhea 09/24/2014   PCP:  [place]  Pharmacy:   [facility] [place], [place] - [address] [place] [address] Phone: [phone] Fax: [phone]     Social Determinants of Health (SDOH) Interventions    Readmission Risk Interventions Readmission Risk Prevention Plan 03/22/2019 10/23/2018  Transportation Screening Complete Complete  PCP or Specialist Appt within 3-5 Days - Complete  HRI or Home Care Consult - Complete  Social Work Consult for Recovery Care Planning/Counseling -  Complete  Palliative Care Screening - Not Applicable  Medication Review Oceanographer) Complete Complete  Some recent data might be hidden

## 2019-03-22 NOTE — Procedures (Signed)
Interventional Radiology Procedure:   Indications: Shortness of breath  Procedure: US guided thoracentesis  Findings: Removed 950 ml from right chest.  Complications: None     EBL: Less than 10 ml  Plan: Follow up CXR   Beryle Bagsby R. Anselm Pancoast, MD  Pager: [phone]

## 2019-03-22 NOTE — Progress Notes (Signed)
Contacted by nursing this afternoon, patient with worsening respiratory distress,   On evaluation she was dyspneic, lethargic, lungs with dullness at the bases, coarse Rales, heart sounds irregularly irregular rate 100 bpm,  Unable to answer yes or no to questions  This morning received Nitropaste, diltiazem 30 mg x 2, Lasix 40 IV x2, digoxin 0.25 mg IV push  Improvement in heart rate, improved blood pressure Minimal urine output after Lasix IV x2 through the course of the day  Discussed with hospitalist service and nursing at the bedside Recommended transfer to ICU for BiPAP Order placed for thoracentesis  I  discussed the case with charge nurse in the ICU, bed available,  I also discussed the case with Dr. Patsey Berthold, intensivist, patient with severe pulmonary hypertension, pleural effusions, diastolic heart failure likely secondary to longstanding poorly controlled atrial fibrillation  Lasix 80 IV ordered Started on BiPAP on the floor in preparation for transfer IR preferred thoracentesis at the bedside, transfer to ICU delayed for procedure, 1 L out on the right   Total encounter time more than 60 minutes  Greater than 50% was spent in counseling and coordination of care with the patient   Signed, Esmond Plants, MD, Ph.D [facility]

## 2019-03-22 NOTE — Progress Notes (Signed)
    BRIEF OVERNIGHT PROGRESS REPORT   SUBJECTIVE: Patient's primary RN report abnormal lung sounds and tachypnea  OBJECTIVE: Patient examined at the bedside. She was afebrile with blood pressure 152/60 mm Hg RR 26, Oxygenation 92% on 3L and pulse rate 86 beats/min. There were no focal neurological deficits; She was alert and oriented x4.  Physical Exam GENERAL: 78 year-old patient lying in the bed with no acute distress.  EYES: Pupils equal, round, reactive to light and accommodation. No scleral icterus. Extraocular muscles intact.  HEENT: Head atraumatic, normocephalic. Oropharynx and nasopharynx clear.  NECK:  Supple, no jugular venous distention. No thyroid enlargement, no tenderness.  LUNGS: Decreased breath sounds bilaterally, Mild crackles and rales auscultated bilaterally. No wheezing,rhonchi or crepitation. No use of accessory muscles of respiration.  CARDIOVASCULAR: S1, S2 normal. No murmurs, rubs, or gallops.  ABDOMEN: Soft, nontender, nondistended. Bowel sounds present. No organomegaly or mass.  EXTREMITIES: Bilateral pedal edema, cyanosis, or clubbing.  NEUROLOGIC: Cranial nerves II through XII are intact. Muscle strength 5/5 in all extremities. Sensation intact. Gait not checked.  PSYCHIATRIC: The patient is alert and oriented x 3.  SKIN: No obvious rash, lesion, or ulcer.   ASSESSMENT:77 y.o.femalewith medical history significant ofinsulin-dependent diabetes mellitus type 2, hypothyroidism, OSA, CKD, Chronic Diastolic CHF,  COPD, and HTN admitted with syncope and hypoglycemia.  PLAN: 1. Acute Respiratory failure with hypoxia - Likely multifactorial in the setting of Afib with RVR, Pulmonary edema/Acute on Chronic Diastolic CHF - Repeat chest xray was obtained and showed bilateral opacities and small pleural effusion unchanged from previous. - She received Lasix 40 mg as scheduled - Supplemental oxygen - Monitor am labs will also add BNP to am labs - Continue management  of underlying medical conditions    Rufina Falco, DNP, CCRN, FNP-C [facility] Nurse Practitioner Between 7pm to 7am - Pager [phone]  After 7am go to [email or web address] - [email or web address]  [facility] [employer]  [phone]

## 2019-03-22 NOTE — Progress Notes (Signed)
Requested respiratory to check on patient due to lung sounds. Crackles noted per respiratory, notified Rufina Falco, NP.

## 2019-03-22 NOTE — Consult Note (Signed)
ANTICOAGULATION CONSULT NOTE -   Pharmacy Consult for Heparin Drip Indication: atrial fibrillation  Allergies  Allergen Reactions  . Penicillins Itching    Has patient had a PCN reaction causing immediate rash, facial/tongue/throat swelling, SOB or lightheadedness with hypotension: No Has patient had a PCN reaction causing severe rash involving mucus membranes or skin necrosis: No Has patient had a PCN reaction that required hospitalization No Has patient had a PCN reaction occurring within the last 10 years: No If all of the above answers are "NO", then may proceed with Cephalosporin use.   . Codeine Other (See Comments)    Unknown reaction  . Glucophage [Metformin Hcl] Other (See Comments)    Unknown reaction  . Loratadine Other (See Comments)    Unknown reaction    Patient Measurements: Height: 5\' 1"  (154.9 cm) Weight: 201 lb 1 oz (91.2 kg) IBW/kg (Calculated) : 47.8 Heparin Dosing Weight: 66.1kg  Vital Signs: Temp: 97.9 F (36.6 C) (11/26 2049) Temp Source: Oral (11/26 2049) BP: 152/60 (11/26 2049) Pulse Rate: 97 (11/26 2049)  Labs: Recent Labs    03/20/19 0711 03/21/19 0549 03/21/19 1523 03/21/19 1554 03/21/19 1736 03/21/19 2237  HGB 13.0 11.4*  --   --   --   --   HCT 40.4 34.5*  --   --   --   --   PLT 119* 85*  --   --   --   --   APTT  --   --   --  26  --   --   LABPROT  --  13.3  --   --   --   --   INR  --  1.0  --   --   --   --   HEPARINUNFRC  --   --   --   --   --  0.89*  CREATININE 1.15* 1.05*  --   --   --   --   TROPONINIHS  --   --  22*  --  25*  --     Estimated Creatinine Clearance: 44.7 mL/min (A) (by C-G formula based on SCr of 1.05 mg/dL (H)).   Medical History: Past Medical History:  Diagnosis Date  . Anemia   . Arthritis   . Asthma   . CHF (congestive heart failure) ([place])   . Chronic kidney disease    stage III  . COPD (chronic obstructive pulmonary disease) ([place])    occasional home O2 use  . Depression   . Diabetes  mellitus without complication ([place])   . Dysrhythmia    bradycardia  . GERD (gastroesophageal reflux disease)   . Glaucoma   . Hx of Clostridium difficile infection March 2016  . Hyperlipidemia   . Hypertension   . Hypothyroidism   . Obstructive sleep apnea   . Sciatica   . Venous insufficiency     Medications:  No PTA anticoagulation of note  Assessment: Laurie Ross is a 78 y.o. female with a hx of diabetes, anemia, chronic kidney disease, COPD, hypertension, hyperlipidemia presenting with hypoglycemia, pharmacy consulted for heparin dosing for atrial fibrillation  11/26 2237 HL 0.89, SUPRAtherapeutic, will reduce rate of heparin infusion  Goal of Therapy:  Heparin level 0.3-0.7 units/ml Monitor platelets by anticoagulation protocol: Yes   Plan:  Reduce Heparin infusion to 850 units/hr Will check Heparin Level (HL) in 8 hours per protocol and CBC daily while on heparin drip.  Ena Dawley, PharmD Clinical Pharmacist 03/22/2019 12:00 AM

## 2019-03-22 NOTE — Progress Notes (Signed)
Pt arrived to unit at this time. Pt is on bipap and breathing is labored. Pt is able to answer some questions but seems drowsy. All VSS at this time.

## 2019-03-22 NOTE — Progress Notes (Signed)
PT Cancellation Note  Patient Details Name: ROLLA SERVIDIO MRN: [id number] DOB: 12/04/1940   Cancelled Treatment:    Reason Eval/Treat Not Completed: Other (comment)(Pt oriented to name, stated she was in [place], disoriented to situation. Declined PT due to fatigue/SOB today. SpO2 86-87%, RN notified. PT to re-attempt as able)   Lieutenant Diego PT, DPT 1:30 PM,03/22/19 [phone]

## 2019-03-22 NOTE — Progress Notes (Signed)
Entered patients room. Patient did not look well. Vital signs ok but still concerned about WOB and physical appearance. Notified both Dr Sarajane Jews and Dr Rockey Situ. Both assessed patient at bedside. Pt placed on bi-pap. Orders to transfer to ICU 18. Prior to being transferred patient received thoracentesis and x-ray in the room. With the assistance of RT, pt transferred to ICU.   Pt daughter notified of pt status.

## 2019-03-22 NOTE — Consult Note (Signed)
ANTICOAGULATION CONSULT NOTE -   Pharmacy Consult for Heparin Drip Indication: atrial fibrillation  Allergies  Allergen Reactions  . Penicillins Itching    Has patient had a PCN reaction causing immediate rash, facial/tongue/throat swelling, SOB or lightheadedness with hypotension: No Has patient had a PCN reaction causing severe rash involving mucus membranes or skin necrosis: No Has patient had a PCN reaction that required hospitalization No Has patient had a PCN reaction occurring within the last 10 years: No If all of the above answers are "NO", then may proceed with Cephalosporin use.   . Codeine Other (See Comments)    Unknown reaction  . Glucophage [Metformin Hcl] Other (See Comments)    Unknown reaction  . Loratadine Other (See Comments)    Unknown reaction    Patient Measurements: Height: 5\' 1"  (154.9 cm) Weight: 202 lb 4.8 oz (91.8 kg) IBW/kg (Calculated) : 47.8 Heparin Dosing Weight: 66.1kg  Vital Signs: Temp: 97.5 F (36.4 C) (11/27 0829) Temp Source: Oral (11/27 0829) BP: 153/124 (11/27 1016) Pulse Rate: 106 (11/27 1016)  Labs: Recent Labs    03/20/19 0711 03/21/19 0549 03/21/19 1523 03/21/19 1554 03/21/19 1736 03/21/19 2237 03/22/19 0442 03/22/19 0555 03/22/19 0853  HGB 13.0 11.4*  --   --   --   --  11.8*  --   --   HCT 40.4 34.5*  --   --   --   --  35.6*  --   --   PLT 119* 85*  --   --   --   --  106*  --   --   APTT  --   --   --  26  --   --   --   --   --   LABPROT  --  13.3  --   --   --   --   --   --   --   INR  --  1.0  --   --   --   --   --   --   --   HEPARINUNFRC  --   --   --   --   --  0.89*  --   --  0.81*  CREATININE 1.15* 1.05*  --   --   --   --   --  1.03*  --   TROPONINIHS  --   --  22*  --  25*  --   --   --   --     Estimated Creatinine Clearance: 45.7 mL/min (A) (by C-G formula based on SCr of 1.03 mg/dL (H)).   Medical History: Past Medical History:  Diagnosis Date  . Anemia   . Arthritis   . Asthma   . CHF  (congestive heart failure) ([place])   . Chronic kidney disease    stage III  . COPD (chronic obstructive pulmonary disease) ([place])    occasional home O2 use  . Depression   . Diabetes mellitus without complication ([place])   . Dysrhythmia    bradycardia  . GERD (gastroesophageal reflux disease)   . Glaucoma   . Hx of Clostridium difficile infection March 2016  . Hyperlipidemia   . Hypertension   . Hypothyroidism   . Obstructive sleep apnea   . Sciatica   . Venous insufficiency     Medications:  No PTA anticoagulation of note  Assessment: Laurie Ross is a 78 y.o. female with a hx of diabetes, anemia, chronic kidney disease, COPD,  hypertension, hyperlipidemia presenting with hypoglycemia, pharmacy consulted for heparin dosing for atrial fibrillation  11/26 2237 HL 0.89, SUPRAtherapeutic, will reduce rate of heparin infusion 11/27 Reduce Heparin infusion to 850 units/hr Will check Heparin Level (HL) in 8 hours per protocol  Goal of Therapy:  Heparin level 0.3-0.7 units/ml Monitor platelets by anticoagulation protocol: Yes   Plan:  11/27 @ 0853 HL= 0.81. WIll Reduce Heparin infusion to 750 units/hr Will recheck Heparin Level (HL) in 8 hours per protocol and CBC daily while on heparin drip.  Angelique Blonder, PharmD Clinical Pharmacist 03/22/2019 10:26 AM

## 2019-03-22 NOTE — Progress Notes (Addendum)
PROGRESS NOTE  Laurie LeisureDoris L Ross ZOX:[id number] DOB: 09/21/1940 DOA: 03/20/2019 PCP[facility] Dr. Malachi Paradiseakar Howell [facility] [phone]  Brief History   78 year old woman lives at home with her daughters, PMH COPD on 2 L nasal cannula, diabetes mellitus type 2, hypothyroidism, OSA presented to the hospital after being found on the floor at home.  Found laying on stomach.  Noted to be hypoglycemic by EMS and given D50.  Took routine dose of Lantus evening prior to presentation.  Patient lost one of her sisters last month and has had a poor appetite.  In the emergency department had 2 episodes of hypoglycemia.  Admitted for further evaluation of hypoglycemia.  A & P  Atrial fibrillation with rapid ventricular response. CHA2DS2-VASc 6. Atrial fibrillation diagnosed by PCP approximately 10 days prior to presentation.  Treated with diltiazem infusion, IV heparin. --Appreciate cardiology involvement.  Plan is for addition of short acting diltiazem, continue heparin infusion, transition to apixaban prior to discharge.  Acute on chronic diastolic CHF.  Thought to be secondary to rapid heart rate.  BNP 453.  Significant bilateral lower extremity edema, left greater than right. --Followed by cardiology, plan is to continue IV Lasix.  Follow potassium. --Poor urine output.  Bladder scan 300 mL.  Given respiratory status, rapid heart rate, CHF, insert Foley catheter given acuity of illness, possible retention, need for accurate I/O and rule out retention.  Acute on chronic hypoxic respiratory failure.  On 2 L at home.  On 3 L here with desaturated station at the time.  SARS Covid negative. --Continue treatment of CHF, heart rate.  Wean to 2 L as tolerated.  Demand ischemia secondary to atrial fibrillation with RVR and volume overload. --Continue aspirin per cardiology, heparin infusion.  Outpatient evaluation to consider noninvasive ischemic evaluation.  Hypoglycemia.  Secondary to ongoing  use of insulin at home complicated by poor oral intake in the context of recent loss of her sister. --Hypoglycemia resolved.  No further evaluation suggested.  Syncope secondary to hypoglycemia.  CT head negative.  Treated with D5 half-normal saline infusion. --Echocardiogram and CT head unremarkable.  No further evaluation suggested.  Thrombocytopenia  --This appears to be chronic dating back to at least June 2016 with multiple data points.  It appears to be stable.  No further evaluation suggested at this time.  Left lower extremity cellulitis present on admission, sepsis considered on admission, now ruled out. --Bilateral lower extremity Dopplers negative for DVT. --Short course of antibiotics.  Will transition to oral medication.  COPD, chronic hypoxic respiratory failure on 2 L nasal cannula at home --COPD does appear to be stable.  Wean oxygen to 2 L as tolerated.  Continue bronchodilators as needed  Hypothyroidism.  TSH within normal limits. --Continue levothyroxine.  CKD stage IIIa --Appears to be stable.  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  Hemoglobin A1c 6.0 11/25. --Poor appetite at home --Start Lantus 5 units daily and follow closely for recurrent hypoglycemia. --Sliding scale insulin. --Given hemoglobin A1c, may need large reduction of Lantus on discharge.  Depression --Continue Zoloft  Dementia.  Followed by [facility]ACE. --stable  DVT prophylaxis: heparin infusion Code Status: DNR Family Communication: none yet Disposition Plan: Physical and occupational therapy has recommended SNF   Brendia Sacksaniel Hailei Besser, MD  [facility] Direct contact: see [email or web address] (further directions at bottom of note if needed) 7PM-7AM contact night coverage as at bottom of note 03/22/2019, 10:20 AM  LOS: 1 day   Significant Hospital Events   . 11/25  admitted for hypoglycemia, syncope, left lower extremity cellulitis . 11/26 developed atrial fibrillation with rapid ventricular  response . 11/26 cardiology consultation . 11/27 Foley catheter insertion   Consults:  . Cardiology   Procedures:   Echocardiogram LVEF 55 to 60%.  Indeterminate diastolic parameters left ventricle.  Severely elevated pulmonary artery systolic pressure. Significant Diagnostic Tests:  . CT head no acute changes . Lower extremity venous Dopplers negative for DVT. Marland Kitchen Chest x-ray bilateral opacities and small pleural effusions.  To my eye there may be some slight improvement 11/27 compared to admission study 11/25   Micro Data:  . SARS Covid negative . Blood cultures pending   Antimicrobials:  .   Interval History/Subjective  Seen overnight by cross cover for tachypnea.  Impression was multifactorial respiratory failure secondary to atrial fibrillation with RVR and acute diastolic CHF.  Feels short of breath.  Per RN she desats at times down into the 70s.  Tachypneic at times into the 30s.  Lives with 2 daughters.  History of falls at home.  Objective   Vitals:  Vitals:   03/22/19 0829 03/22/19 1016  BP: (!) 150/68 (!) 153/124  Pulse: 90 (!) 106  Resp:    Temp: (!) 97.5 F (36.4 C)   SpO2: 91% 90%    Exam:  Constitutional:  . Appears calm, comfortable, nontoxic  ENT grossly normal hearing Respiratory inspiratory crackles noted.  No wheezes or rhonchi.  Mild increased respiratory effort.  No retractions noted.  Able to speak in short sentences. Cardiovascular.  Irregular, tachycardic, no murmur, rub or gallop.  2+ bilateral lower extremity edema, left greater than right.  Bilateral upper extremity edema noted.  Telemetry atrial fibrillation with heart rate 100-1 20s. Abdomen soft Skin there is erythema left lower extremity just proximal to the ankle medially. Psychiatric.  Grossly normal mood and affect.  Speech fluent and appropriate.   I have personally reviewed the following:   Today's Data  . Urine output 250 . Last episode of hypoglycemia 11/25 at noon.  CBG  now stable.  Scheduled Meds: . aspirin EC  81 mg Oral Daily  . digoxin  0.25 mg Intravenous Once  . diltiazem  30 mg Oral Q8H  . diltiazem  30 mg Oral Once  . docusate sodium  100 mg Oral Daily  . ferrous sulfate  325 mg Oral Q M,W,F  . furosemide  40 mg Intravenous BID  . furosemide  40 mg Intravenous Once  . insulin aspart  0-6 Units Subcutaneous TID WC  . insulin glargine  5 Units Subcutaneous Daily  . levothyroxine  50 mcg Oral Daily  . mouth rinse  15 mL Mouth Rinse BID  . nitroGLYCERIN  1 inch Topical Once  . pantoprazole  40 mg Oral Daily  . potassium chloride  20 mEq Oral BID  . sertraline  150 mg Oral QHS  . traZODone  100 mg Oral QHS  . valACYclovir  500 mg Oral Daily   Continuous Infusions: . cefTRIAXone (ROCEPHIN)  IV 1 g (03/22/19 0845)  . heparin 850 Units/hr (03/22/19 0300)    Principal Problem:   Atrial fibrillation with RVR (HCC) Active Problems:   Hypokalemia   Syncope   Hypoglycemia due to insulin   Type II diabetes mellitus with renal manifestations (HCC)   CKD stage G3a/A1, GFR 45-59 and albumin creatinine ratio <30 mg/g   Cellulitis of left leg   Hypothyroidism   GERD (gastroesophageal reflux disease)   Hypoglycemia   Thrombocytopenia (HCC)  Acute on chronic diastolic CHF (congestive heart failure) (HCC)   COPD (chronic obstructive pulmonary disease) (HCC)   Acute on chronic respiratory failure with hypoxia ([place])   LOS: 1 day   How to contact the [facility] Attending or Consulting provider 7A - 7P or covering provider during after hours [place], for this patient?  1. Check the care team in [facility] and look for a) attending/consulting TR[facility] provider listed and b) the [facility] team listed 2. Log into [email or web address] and use [place] universal password to access. If you do not have the password, please contact the hospital operator. 3. Locate the [facility] provider you are looking for under [facility] and page to a number that you can be directly reached. 4. If  you still have difficulty reaching the provider, please page the [facility] (Director on Call) for the Hospitalists listed on amion for assistance.

## 2019-03-22 NOTE — Progress Notes (Addendum)
Inpatient Diabetes Program Recommendations  AACE/ADA: New Consensus Statement on Inpatient Glycemic Control (2015)  Target Ranges:  Prepandial:   less than 140 mg/dL      Peak postprandial:   less than 180 mg/dL (1-2 hours)      Critically ill patients:  140 - 180 mg/dL   Results for BEATRIS, BELEN (MRN [id number]) as of 03/22/2019 07:35  Ref. Range 03/20/2019 23:51 03/21/2019 04:36 03/21/2019 07:47 03/21/2019 11:35 03/21/2019 16:51 03/21/2019 20:56  Glucose-Capillary Latest Ref Range: 70 - 99 mg/dL 245 (H) 238 (H) 224 (H)  2 units NOVOLOG  233 (H)  2 units NOVOLOG  197 (H)  1 unit NOVOLOG  190 (H)   Results for SHADIE, SWEATMAN (MRN [id number]) as of 03/22/2019 07:35  Ref. Range 10/22/2018 05:26 03/20/2019 07:11  Hemoglobin A1C Latest Ref Range: 4.8 - 5.6 % 6.6 (H) 6.0 (H)     Home DM Meds: Lantus 35 units QHS  Current Orders: Novolog 0-6 units TID with meals (Very Sensitive scale)     MD- Lab glucose levels elevated the last 2 days in the morning  248 mg/dl 11/26 at 5:49am 209 mg/dl 11/27 at 5:55am  1. Please consider starting Lantus 8 units Daily (25% total home dose)  2. Please consider increasing the Novolog Corrections scale (SSI) to the Sensitive scale (0-9 units) TID with meals  3. Given Current A1c of 6% and issues with Hypoglycemia present on admission, may need large reduction of home Lantus dose     --Will follow patient during hospitalization--  Wyn Quaker RN, MSN, CDE Diabetes Coordinator Inpatient Glycemic Control Team Team Pager: [phone] (8a-5p)

## 2019-03-23 ENCOUNTER — Inpatient Hospital Stay: Payer: Medicare (Managed Care)

## 2019-03-23 DIAGNOSIS — J9602 Acute respiratory failure with hypercapnia: Secondary | ICD-10-CM

## 2019-03-23 LAB — COMPREHENSIVE METABOLIC PANEL
ALT: 16 U/L (ref 0–44)
AST: 15 U/L (ref 15–41)
Albumin: 2.6 g/dL — ABNORMAL LOW (ref 3.5–5.0)
Alkaline Phosphatase: 59 U/L (ref 38–126)
Anion gap: 10 (ref 5–15)
BUN: 24 mg/dL — ABNORMAL HIGH (ref 8–23)
CO2: 28 mmol/L (ref 22–32)
Calcium: 8.5 mg/dL — ABNORMAL LOW (ref 8.9–10.3)
Chloride: 106 mmol/L (ref 98–111)
Creatinine, Ser: 0.98 mg/dL (ref 0.44–1.00)
GFR calc Af Amer: >60 mL/min (ref >60)
GFR calc non Af Amer: 55 mL/min — ABNORMAL LOW (ref >60)
Glucose, Bld: 125 mg/dL — ABNORMAL HIGH (ref 70–99)
Potassium: 3.5 mmol/L (ref 3.5–5.1)
Sodium: 144 mmol/L (ref 135–145)
Total Bilirubin: 0.6 mg/dL (ref 0.3–1.2)
Total Protein: 5.6 g/dL — ABNORMAL LOW (ref 6.5–8.1)

## 2019-03-23 LAB — GLUCOSE, CAPILLARY
Glucose-Capillary: 114 mg/dL — ABNORMAL HIGH (ref 70–99)
Glucose-Capillary: 109 mg/dL — ABNORMAL HIGH (ref 70–99)
Glucose-Capillary: 107 mg/dL — ABNORMAL HIGH (ref 70–99)
Glucose-Capillary: 112 mg/dL — ABNORMAL HIGH (ref 70–99)
Glucose-Capillary: 115 mg/dL — ABNORMAL HIGH (ref 70–99)
Glucose-Capillary: 116 mg/dL — ABNORMAL HIGH (ref 70–99)
Glucose-Capillary: 136 mg/dL — ABNORMAL HIGH (ref 70–99)
Glucose-Capillary: 122 mg/dL — ABNORMAL HIGH (ref 70–99)

## 2019-03-23 LAB — PROTEIN, BODY FLUID (OTHER): Total Protein, Body Fluid Other: 1.4 g/dL

## 2019-03-23 LAB — CBC
HCT: 33.5 % — ABNORMAL LOW (ref 36.0–46.0)
Hemoglobin: 10.8 g/dL — ABNORMAL LOW (ref 12.0–15.0)
MCH: 32.8 pg (ref 26.0–34.0)
MCHC: 32.2 g/dL (ref 30.0–36.0)
MCV: 101.8 fL — ABNORMAL HIGH (ref 80.0–100.0)
Platelets: 95 10*3/uL — ABNORMAL LOW (ref 150–400)
RBC: 3.29 MIL/uL — ABNORMAL LOW (ref 3.87–5.11)
RDW: 14.3 % (ref 11.5–15.5)
WBC: 7.9 10*3/uL (ref 4.0–10.5)
nRBC: 0 % (ref 0.0–0.2)

## 2019-03-23 LAB — MAGNESIUM: Magnesium: 1.8 mg/dL (ref 1.7–2.4)

## 2019-03-23 LAB — HEPARIN LEVEL (UNFRACTIONATED): Heparin Unfractionated: 0.2 IU/mL — ABNORMAL LOW (ref 0.30–0.70)

## 2019-03-23 LAB — PHOSPHORUS: Phosphorus: 2.8 mg/dL (ref 2.5–4.6)

## 2019-03-23 MED ORDER — IPRATROPIUM-ALBUTEROL 0.5-2.5 (3) MG/3ML IN SOLN
3.0000 mL | Freq: Four times a day (QID) | RESPIRATORY_TRACT | Status: DC
  Administered 2019-03-23 – 2019-03-24 (×4): 3 mL via RESPIRATORY_TRACT
  Filled 2019-03-23 (×5): qty 3

## 2019-03-23 MED ORDER — METOPROLOL TARTRATE 5 MG/5ML IV SOLN
5.0000 mg | Freq: Once | INTRAVENOUS | Status: AC
  Administered 2019-03-23: 2.5 mg via INTRAVENOUS
  Filled 2019-03-23: qty 5

## 2019-03-23 MED ORDER — BUDESONIDE 0.25 MG/2ML IN SUSP
0.2500 mg | Freq: Two times a day (BID) | RESPIRATORY_TRACT | Status: DC
  Administered 2019-03-23 – 2019-03-26 (×6): 0.25 mg via RESPIRATORY_TRACT
  Filled 2019-03-23 (×6): qty 2

## 2019-03-23 MED ORDER — DILTIAZEM HCL 30 MG PO TABS
90.0000 mg | ORAL_TABLET | Freq: Three times a day (TID) | ORAL | Status: DC
  Administered 2019-03-23 – 2019-03-24 (×3): 90 mg via ORAL
  Filled 2019-03-23 (×3): qty 3

## 2019-03-23 MED ORDER — HEPARIN (PORCINE) 25000 UT/250ML-% IV SOLN: 750.0000 [IU]/h | INTRAVENOUS | Status: DC

## 2019-03-23 MED ORDER — HEPARIN BOLUS VIA INFUSION
4000.0000 [IU] | Freq: Once | INTRAVENOUS | Status: DC
  Filled 2019-03-23: qty 4000

## 2019-03-23 MED ORDER — HEPARIN (PORCINE) 25000 UT/250ML-% IV SOLN
900.0000 [IU]/h | INTRAVENOUS | Status: DC
  Administered 2019-03-23: 750 [IU]/h via INTRAVENOUS
  Administered 2019-03-24: 900 [IU]/h via INTRAVENOUS
  Filled 2019-03-23 (×2): qty 250

## 2019-03-23 NOTE — Consult Note (Addendum)
ANTICOAGULATION CONSULT NOTE -   Pharmacy Consult for Heparin Drip Indication: atrial fibrillation  Patient Measurements: Height: 5\' 1"  (154.9 cm) Weight: 192 lb 0.3 oz (87.1 kg) IBW/kg (Calculated) : 47.8 Heparin Dosing Weight: 66.1kg  Vital Signs: Temp: 98.1 F (36.7 C) (11/28 0800) Temp Source: Axillary (11/28 0800) BP: 146/64 (11/28 0800) Pulse Rate: 99 (11/28 0800)  Labs: Recent Labs    03/21/19 0549 03/21/19 1523 03/21/19 1554 03/21/19 1736 03/21/19 2237 03/22/19 0442 03/22/19 0555 03/22/19 0853 03/22/19 1936 03/23/19 0558  HGB 11.4*  --   --   --   --  11.8*  --   --   --  10.8*  HCT 34.5*  --   --   --   --  35.6*  --   --   --  33.5*  PLT 85*  --   --   --   --  106*  --   --   --  95*  APTT  --   --  26  --   --   --   --   --   --   --   LABPROT 13.3  --   --   --   --   --   --   --   --   --   INR 1.0  --   --   --   --   --   --   --   --   --   HEPARINUNFRC  --   --   --   --  0.89*  --   --  0.81* 0.14*  --   CREATININE 1.05*  --   --   --   --   --  1.03*  --   --  0.98  TROPONINIHS  --  22*  --  25*  --   --   --   --   --   --     Estimated Creatinine Clearance: 46.7 mL/min (by C-G formula based on SCr of 0.98 mg/dL).   Medical History: Past Medical History:  Diagnosis Date  . Anemia   . Arthritis   . Asthma   . CHF (congestive heart failure) (HCC)   . Chronic kidney disease    stage III  . COPD (chronic obstructive pulmonary disease) (HCC)    occasional home O2 use  . Depression   . Diabetes mellitus without complication (HCC)   . Dysrhythmia    bradycardia  . GERD (gastroesophageal reflux disease)   . Glaucoma   . Hx of Clostridium difficile infection March 2016  . Hyperlipidemia   . Hypertension   . Hypothyroidism   . Obstructive sleep apnea   . Sciatica   . Venous insufficiency     Medications:  No PTA anticoagulation of note  Assessment: Laurie Ross is a 78 y.o. female with a hx of diabetes, anemia, chronic kidney  disease, COPD, hypertension, hyperlipidemia presenting with hypoglycemia, pharmacy consulted for heparin dosing for atrial fibrillation. The heparin infusion was d/c on 11/27. The last heparin level was 0.81 on 11/27 at 0853 and the infusion rate was reduced to 750 units/hr from 850 units/hr. Thrombocytopenia is noted to be chronic dating back to at least June 2016 with multiple data points.  It appears to be stable.   Goal of Therapy:  Heparin level 0.3-0.7 units/ml Monitor platelets by anticoagulation protocol: Yes   Plan:   restart heparin infusion at 750 units/hr: d/w Dr July 2016  and bolus is being withheld due to thrombocytopenia  check Heparin Level (HL) in 8 hours per protocol and CBC daily while on heparin drip.  Dallie Piles, PharmD Clinical Pharmacist 03/23/2019 11:38 AM

## 2019-03-23 NOTE — Progress Notes (Signed)
At around 0000, patient had several low blood pressures, with the last being 102/40 (58). As a result, nitroglycerin ointment dose and 80 mg lasix dose was held. Nitro ointment dose that was given at 2156 was wiped from the patient's skin. Patient is very drowsy and is unable to take PO medications. When an attempt was made to give medications orally earlier this shift, the patient began to cough, so this route is discouraged at this time as she is at risk for aspiration. Patient should remain NPO. She is currently resting comfortably. Will continue to monitor.  Cameron Ali, RN

## 2019-03-23 NOTE — Progress Notes (Signed)
Progress Note  Patient Name: Laurie Ross Date of Encounter: 03/23/2019  Primary Cardiologist:  New   Subjective   Somnolent and on BiPAP  Inpatient Medications    Scheduled Meds: . aspirin EC  81 mg Oral Daily  . cephALEXin  500 mg Oral Q6H  . Chlorhexidine Gluconate Cloth  6 each Topical Daily  . diltiazem  60 mg Oral Q8H  . docusate sodium  100 mg Oral Daily  . feeding supplement (NEPRO CARB STEADY)  237 mL Oral BID BM  . ferrous sulfate  325 mg Oral Q M,W,F  . furosemide  80 mg Intravenous Q8H  . insulin aspart  0-9 Units Subcutaneous Q6H  . insulin glargine  5 Units Subcutaneous Daily  . levothyroxine  50 mcg Oral Daily  . mouth rinse  15 mL Mouth Rinse BID  . multivitamin with minerals  1 tablet Oral Daily  . nitroGLYCERIN  1 inch Topical Q6H  . pantoprazole  40 mg Oral Daily  . potassium chloride  20 mEq Oral BID  . sertraline  150 mg Oral QHS  . traZODone  100 mg Oral QHS  . valACYclovir  500 mg Oral Daily   Continuous Infusions:  PRN Meds: acetaminophen **OR** acetaminophen, calcium carbonate, loperamide, morphine injection, senna-docusate   Vital Signs    Vitals:   03/23/19 0400 03/23/19 0700 03/23/19 0759 03/23/19 0800  BP: 139/68   (!) 146/64  Pulse: 95 82 93 99  Resp: 18 16  (!) 24  Temp: 99.2 F (37.3 C)   98.1 F (36.7 C)  TempSrc: Axillary   Axillary  SpO2: 100%  100% 100%  Weight:      Height:        Intake/Output Summary (Last 24 hours) at 03/23/2019 0849 Last data filed at 03/23/2019 0800 Gross per 24 hour  Intake 108.49 ml  Output 1335 ml  Net -1226.51 ml   Last 3 Weights 03/23/2019 03/22/2019 03/22/2019  Weight (lbs) 192 lb 0.3 oz 197 lb 5 oz 202 lb 4.8 oz  Weight (kg) 87.1 kg 89.5 kg 91.763 kg      Telemetry    Atrial fib with a RVR - Personally Reviewed  ECG    none - Personally Reviewed  Physical Exam   GEN: No acute distress.   Neck: No JVD Cardiac: RRR, no murmurs, rubs, or gallops.  Respiratory: Clear  to auscultation bilaterally. GI: Soft, nontender, non-distended  MS: No edema; No deformity. Neuro:  Nonfocal  Psych: Normal affect   Labs    High Sensitivity Troponin:   Recent Labs  Lab 03/21/19 1523 03/21/19 1736  TROPONINIHS 22* 25*      Chemistry Recent Labs  Lab 03/20/19 0711 03/21/19 0549 03/22/19 0555 03/23/19 0558  NA 142 142 143 144  K 3.2* 3.8 3.8 3.5  CL 105 106 105 106  CO2 29 29 27 28   GLUCOSE 70 248* 209* 125*  BUN 28* 22 25* 24*  CREATININE 1.15* 1.05* 1.03* 0.98  CALCIUM 8.8* 8.3* 8.6* 8.5*  PROT 6.4* 5.4*  --  5.6*  ALBUMIN 3.0* 2.5*  --  2.6*  AST 19 16  --  15  ALT 17 16  --  16  ALKPHOS 79 64  --  59  BILITOT 0.8 0.6  --  0.6  GFRNONAA 45* 50* 52* 55*  GFRAA 52* 58* 60* >60  ANIONGAP 8 7 11 10      Hematology Recent Labs  Lab 03/21/19 0549 03/22/19 0442 03/23/19 03/24/19  WBC 8.3 9.4 7.9  RBC 3.52* 3.61* 3.29*  HGB 11.4* 11.8* 10.8*  HCT 34.5* 35.6* 33.5*  MCV 98.0 98.6 101.8*  MCH 32.4 32.7 32.8  MCHC 33.0 33.1 32.2  RDW 14.6 14.6 14.3  PLT 85* 106* 95*    BNP Recent Labs  Lab 03/20/19 1638 03/22/19 0442  BNP 453.0* 617.0*     DDimer No results for input(s): DDIMER in the last 168 hours.   Radiology    Dg Chest 1 View  Result Date: 03/22/2019 CLINICAL DATA:  Shortness of breath EXAM: CHEST  1 VIEW COMPARISON:  03/20/2019 FINDINGS: Shallow lung inflation with unchanged multifocal bilateral airspace opacities. Cardiomediastinal contours are unchanged. Small pleural effusions. IMPRESSION: Unchanged examination with bilateral opacities and small pleural effusions. Electronically Signed   By: Ulyses Jarred M.D.   On: 03/22/2019 03:17   US Renal  Result Date: 03/22/2019 CLINICAL DATA:  Oliguria. EXAM: RENAL / URINARY TRACT ULTRASOUND COMPLETE COMPARISON:  None. FINDINGS: Right Kidney: Renal measurements: 10.4 x 5.2 x 5.1 cm = volume: 143 mL . Echogenicity within normal limits. No mass or hydronephrosis visualized. Left Kidney:  Renal measurements: 10.1 x 4.8 x 4.0 cm = volume: 101 mL. Echogenicity within normal limits. No mass or hydronephrosis visualized. Bladder: Decompressed secondary to Foley catheter. Other: None. IMPRESSION: Normal renal ultrasound. Electronically Signed   By: Marijo Conception M.D.   On: 03/22/2019 17:45   Dg Chest Port 1 View  Result Date: 03/23/2019 CLINICAL DATA:  Acute respiratory failure EXAM: PORTABLE CHEST 1 VIEW COMPARISON:  Chest x-rays dated 03/22/2019 FINDINGS: Heart size and mediastinal contours are stable. Stable opacity at the LEFT lung base, likely atelectasis and/or small pleural effusion. Probable mild atelectasis and/or small pleural effusion at the RIGHT lung base. No pneumothorax seen. No new lung findings. IMPRESSION: No new lung findings. Probable mild bibasilar atelectasis and/or small pleural effusions. Electronically Signed   By: Franki Cabot M.D.   On: 03/23/2019 07:40   Dg Chest Port 1 View  Result Date: 03/22/2019 CLINICAL DATA:  Status post thoracentesis EXAM: PORTABLE CHEST 1 VIEW COMPARISON:  03/22/2019, 2:57 a.m. FINDINGS: Status post right thoracentesis with near complete resolution of a small right pleural effusion. No significant pneumothorax. Unchanged small left pleural effusion. Unchanged diffuse interstitial opacity, likely edema. Cardiomegaly. IMPRESSION: Status post right thoracentesis with near complete resolution of a small right pleural effusion. No significant pneumothorax. Electronically Signed   By: Eddie Candle M.D.   On: 03/22/2019 16:34   US Thoracentesis Asp Pleural Space W/img Guide  Result Date: 03/22/2019 INDICATION: Shortness of breath.  Pleural effusions. EXAM: ULTRASOUND GUIDED RIGHT THORACENTESIS MEDICATIONS: None. COMPLICATIONS: None immediate. PROCEDURE: An ultrasound guided thoracentesis was thoroughly discussed with the patient's daughter on the telephone. The benefits, risks, alternatives and complications were also discussed. Informed  consent was obtained. Ultrasound was performed to localize and mark an adequate pocket of fluid in the right chest. The area was then prepped and draped in the normal sterile fashion. 1% Lidocaine was used for local anesthesia. Under ultrasound guidance a 6 Fr Safe-T-Centesis catheter was introduced. Thoracentesis was performed. The catheter was removed and a dressing applied. FINDINGS: A total of approximately 950 mL of yellow fluid was removed. Samples were sent to the laboratory as requested by the clinical team. IMPRESSION: Successful ultrasound guided right thoracentesis yielding 950 mL of pleural fluid. Electronically Signed   By: Markus Daft M.D.   On: 03/22/2019 16:22    Cardiac Studies  Echo  03/21/19  Left ventricular ejection fraction, by visual estimation, is 55 to 60%. The left ventricle has normal function. There is no left ventricular hypertrophy. 2. Left ventricular diastolic parameters are indeterminate. 3. Global right ventricle has normal systolic function.The right ventricular size is normal. No increase in right ventricular wall thickness. 4. Left atrial size was mild-moderately dilated. 5. Tricuspid valve regurgitation mild-moderate. 6. The aortic valve was not well visualized. Moderately calcified. No valve gradient measurements obtained (consider repeat study at later date for evaluation). Aortic valve regurgitation is not visualized. 7. Severely elevated pulmonary artery systolic pressure. 8. Left pleural effusion noted, 8 cm 9. The tricuspid regurgitant velocity is 4.13 m/s, and with an assumed right atrial pressure of 10 mmHg, the estimated right ventricular systolic pressure is severely elevated at 78.2 mmHg. 10. Rhythm is atrial fibrillation.  Patient Profile     Laurie Ross is a 78 y.o. female with a hx of diabetes, anemia, chronic kidney disease, COPD, hypertension, hyperlipidemia presenting with hypoglycemia, cardiology consulted for atrial fibrillation    Assessment & Plan    1  Atrial fibrillation with RVR - her VR is not well controlled. Consider switching to IV cardizem 10 mg/hour  2  Acute diastolic CHF - IV lasix as needed. She is maintaining her O2 Sat at present. She is s/p thoracentesis 3. Pulmonary hypotension - She will need additional eval as to the etiology.   For questions or updates, please contact [facility] Please consult [email or web address] for contact info under   Signed, Lewayne BuntingGregg Taylor, MD  03/23/2019, 8:49 AM

## 2019-03-23 NOTE — Progress Notes (Signed)
Pt placed on [place] from BIPAP per MD.  On 4L SPO2@ 99%. Pt a&O x 2 to self and place only.

## 2019-03-23 NOTE — Progress Notes (Signed)
PROGRESS NOTE  Laurie LeisureDoris L Stemmler ZOX:[id number] DOB: 03/11/1941 DOA: 03/20/2019 PCP: [facility] Dr. Malachi Paradiseakar Howell [facility] [phone]  Brief History   78 year old woman lives at home with her daughters, PMH COPD on 2 L nasal cannula, diabetes mellitus type 2, hypothyroidism, OSA presented to the hospital after being found on the floor at home.  Found laying on stomach.  Noted to be hypoglycemic by EMS and given D50.  Took routine dose of Lantus evening prior to presentation.  Patient lost one of her sisters last month and has had a poor appetite.  In the emergency department had 2 episodes of hypoglycemia.  Admitted for further evaluation of hypoglycemia.  A & P  Acute on chronic hypoxic respiratory failure, acute hypercapnic respiratory failure with respiratory acidosis.  On 2 L at home.  On 3 L here with desaturated station at the time.  SARS Covid negative. --Complicated, multifactorial, acute CHF with poor diuresis thus far, severe pulmonary hypertension, COPD.  Appreciate pulmonary involvement.  Continue bronchodilators and steroids as per pulmonary. --Continue BiPAP as needed during the day and each night.  Continue attempts at diuresis.  Atrial fibrillation with rapid ventricular response. CHA2DS2-VASc 6. Atrial fibrillation diagnosed by PCP approximately 10 days prior to presentation.  Treated with diltiazem infusion, IV heparin. --Cardiology managing.  Continues on diltiazem.  Plan for apixaban prior to discharge if condition improves.  Resume heparin infusion  Acute on chronic diastolic CHF.  Thought to be secondary to rapid heart rate.  BNP 453.  Significant bilateral lower extremity edema, left greater than right. --Urine output improved.  Continue increase of Lasix.  Daily BMP.  Creatinine stable. --Wean BiPAP as tolerated --Continue Foley catheter given urinary retention, critical illness, need to closely monitor I/O.  COPD, chronic hypoxic respiratory failure on 2  L nasal cannula at home, severe pulmonary hypertension --Continue BiPAP, inhaled steroids, bronchodilators.  Left lower extremity cellulitis present on admission, sepsis considered on admission, now ruled out. Bilateral lower extremity Dopplers negative for DVT. --Appears to be improving.  Complete short course of oral antibiotics.  Demand ischemia secondary to atrial fibrillation with RVR and volume overload. --Appears stable.   --Continue aspirin per cardiology, heparin infusion.  Outpatient evaluation to consider noninvasive ischemic evaluation.  Thrombocytopenia  --Chronic dating back to at least June 2016 with multiple data points.  It appears to be stable.  No further evaluation suggested at this time.  Hypoglycemia.  Secondary to ongoing use of insulin at home complicated by poor oral intake in the context of recent loss of her sister. --Hypoglycemia resolved.  No further evaluation suggested.  Syncope secondary to hypoglycemia.  CT head negative.  Treated with D5 half-normal saline infusion. --Echocardiogram and CT head unremarkable.  No further evaluation suggested.  Hypothyroidism.  TSH within normal limits. --Continue levothyroxine.  CKD stage IIIa --Stable  Diabetes mellitus type 2 with associated CKD.  Hemoglobin A1c 6.0 11/25. --CBG stable.  No lows. --Continue Lantus 5 units daily and follow closely for recurrent hypoglycemia. --Sliding scale insulin. --Given hemoglobin A1c, may need large reduction of Lantus on discharge.  Depression --Continue Zoloft when able  Dementia.  Followed by [facility]. --stable  She appears slightly better this morning but remains critically ill on BiPAP, urine output has improved but is not adequate and she continues to have anasarca.  CHF is complicated by severe pulmonary hypertension.  Appreciate cardiology and pulmonology involvement.  Prognosis remains guarded, may not survive this hospitalization.  DVT prophylaxis: heparin infusion  Code  Status: DNR Family Communication: Discussed in detail with daughter at bedside yesterday and also by telephone Disposition Plan: Physical and occupational therapy has recommended SNF   Brendia Sacks, MD  [facility] Direct contact: see [email or web address] (further directions at bottom of note if needed) 7PM-7AM contact night coverage as at bottom of note 03/23/2019, 11:11 AM  LOS: 2 days   Significant Hospital Events   . 11/25 admitted for hypoglycemia, syncope, left lower extremity cellulitis . 11/26 developed atrial fibrillation with rapid ventricular response . 11/26 cardiology consultation . 11/27 Foley catheter insertion . 11/27 worsening respiratory failure, transferred to ICU, started on BiPAP   Consults:  . Cardiology . Pulmonology   Procedures:   Echocardiogram LVEF 55 to 60%.  Indeterminate diastolic parameters left ventricle.  Severely elevated pulmonary artery systolic pressure.  11/27 large-volume thoracentesis 950 mL removed right chest Significant Diagnostic Tests:  . CT head no acute changes . Lower extremity venous Dopplers negative for DVT. Marland Kitchen Chest x-ray bilateral opacities and small pleural effusions.  To my eye there may be some slight improvement 11/27 compared to admission study 11/25   Micro Data:  . SARS Covid negative . Blood cultures pending   Antimicrobials:  .   Interval History/Subjective  Had an episode of hypotension overnight.  Nitroglycerin was stopped.  Pulmonary critical care evaluated overnight.  This a.m. the patient seems to feel okay.  Seen on BiPAP but able to follow simple commands and answer simple questions.  Objective   Vitals:  Vitals:   03/23/19 0800 03/23/19 1100  BP: (!) 146/64   Pulse: 99   Resp: (!) 24   Temp: 98.1 F (36.7 C)   SpO2: 100% 98%    Exam:  Constitutional.  Currently on BiPAP.  However appears better today, calm, comfortable.  Respiratory rate in the 20s or higher. Respiratory.  Clear to  auscultation bilaterally.  No wheezes, rales or rhonchi.  Fair air movement.  Mild increased respiratory effort.  Tachypneic. Cardiovascular.  Irregular, tachycardic, no murmur, rub or gallop.  No significant change in bilateral upper and lower extremity edema, 3+. Abdomen soft, nontender. Psychiatric.  Alert, follows simple commands.  I have personally reviewed the following:   Today's Data  . Urine output 1210.  +233 since admission. Marland Kitchen BMP notable for mild elevation of BUN.  Creatinine within normal limits.  Magnesium, phosphorus, LFTs unremarkable. . Hemoglobin mildly lower at 10.8.  WBC within normal limits.  Platelets stable at 95. . Chest x-ray noted, no significant change  Scheduled Meds: . aspirin EC  81 mg Oral Daily  . budesonide (PULMICORT) nebulizer solution  0.25 mg Nebulization Q12H  . cephALEXin  500 mg Oral Q6H  . Chlorhexidine Gluconate Cloth  6 each Topical Daily  . diltiazem  60 mg Oral Q8H  . docusate sodium  100 mg Oral Daily  . feeding supplement (NEPRO CARB STEADY)  237 mL Oral BID BM  . ferrous sulfate  325 mg Oral Q M,W,F  . furosemide  80 mg Intravenous Q8H  . insulin aspart  0-9 Units Subcutaneous Q6H  . insulin glargine  5 Units Subcutaneous Daily  . ipratropium-albuterol  3 mL Nebulization Q6H  . levothyroxine  50 mcg Oral Daily  . mouth rinse  15 mL Mouth Rinse BID  . multivitamin with minerals  1 tablet Oral Daily  . nitroGLYCERIN  1 inch Topical Q6H  . pantoprazole  40 mg Oral Daily  . potassium chloride  20 mEq Oral BID  . sertraline  150 mg Oral QHS  . traZODone  100 mg Oral QHS  . valACYclovir  500 mg Oral Daily   Continuous Infusions:   Principal Problem:   Atrial fibrillation with RVR (HCC) Active Problems:   Hypokalemia   Syncope   Hypoglycemia due to insulin   Type II diabetes mellitus with renal manifestations (HCC)   CKD stage G3a/A1, GFR 45-59 and albumin creatinine ratio <30 mg/g   Cellulitis of left leg   Hypothyroidism    GERD (gastroesophageal reflux disease)   Hypoglycemia   Thrombocytopenia (HCC)   Acute on chronic diastolic CHF (congestive heart failure) (HCC)   COPD (chronic obstructive pulmonary disease) ([place])   Acute on chronic respiratory failure with hypoxia ([place])   LOS: 2 days   How to contact the [facility] Attending or Consulting provider 7A - 7P or covering provider during after hours [place], for this patient?  1. Check the care team in [facility] and look for a) attending/consulting [facility] provider listed and b) the [facility] team listed 2. Log into [email or web address] and use [place]'s universal password to access. If you do not have the password, please contact the hospital operator. 3. Locate the [facility] provider you are looking for under [facility] and page to a number that you can be directly reached. 4. If you still have difficulty reaching the provider, please page the [facility] (Director on Call) for the Hospitalists listed on amion for assistance.

## 2019-03-24 LAB — BASIC METABOLIC PANEL
Anion gap: 12 (ref 5–15)
BUN: 22 mg/dL (ref 8–23)
CO2: 31 mmol/L (ref 22–32)
Calcium: 8.5 mg/dL — ABNORMAL LOW (ref 8.9–10.3)
Chloride: 101 mmol/L (ref 98–111)
Creatinine, Ser: 0.94 mg/dL (ref 0.44–1.00)
GFR calc Af Amer: >60 mL/min (ref >60)
GFR calc non Af Amer: 58 mL/min — ABNORMAL LOW (ref >60)
Glucose, Bld: 154 mg/dL — ABNORMAL HIGH (ref 70–99)
Potassium: 3.4 mmol/L — ABNORMAL LOW (ref 3.5–5.1)
Sodium: 144 mmol/L (ref 135–145)

## 2019-03-24 LAB — CBC
HCT: 34.3 % — ABNORMAL LOW (ref 36.0–46.0)
Hemoglobin: 11.4 g/dL — ABNORMAL LOW (ref 12.0–15.0)
MCH: 32.3 pg (ref 26.0–34.0)
MCHC: 33.2 g/dL (ref 30.0–36.0)
MCV: 97.2 fL (ref 80.0–100.0)
Platelets: 114 10*3/uL — ABNORMAL LOW (ref 150–400)
RBC: 3.53 MIL/uL — ABNORMAL LOW (ref 3.87–5.11)
RDW: 14.5 % (ref 11.5–15.5)
WBC: 9.6 10*3/uL (ref 4.0–10.5)
nRBC: 0 % (ref 0.0–0.2)

## 2019-03-24 LAB — GLUCOSE, CAPILLARY
Glucose-Capillary: 114 mg/dL — ABNORMAL HIGH (ref 70–99)
Glucose-Capillary: 139 mg/dL — ABNORMAL HIGH (ref 70–99)
Glucose-Capillary: 123 mg/dL — ABNORMAL HIGH (ref 70–99)
Glucose-Capillary: 112 mg/dL — ABNORMAL HIGH (ref 70–99)
Glucose-Capillary: 121 mg/dL — ABNORMAL HIGH (ref 70–99)

## 2019-03-24 LAB — PH, BODY FLUID: pH, Body Fluid: 7.5

## 2019-03-24 LAB — HEPARIN LEVEL (UNFRACTIONATED)
Heparin Unfractionated: 0.37 IU/mL (ref 0.30–0.70)
Heparin Unfractionated: 0.3 IU/mL (ref 0.30–0.70)

## 2019-03-24 MED ORDER — DILTIAZEM HCL ER COATED BEADS 180 MG PO CP24
360.0000 mg | ORAL_CAPSULE | Freq: Every day | ORAL | Status: DC
  Administered 2019-03-24 – 2019-03-26 (×3): 360 mg via ORAL
  Filled 2019-03-24 (×3): qty 2

## 2019-03-24 MED ORDER — MUPIROCIN 2 % EX OINT
1.0000 "application " | TOPICAL_OINTMENT | Freq: Two times a day (BID) | CUTANEOUS | Status: DC
  Administered 2019-03-24 – 2019-03-26 (×5): 1 via NASAL
  Filled 2019-03-24 (×2): qty 22

## 2019-03-24 MED ORDER — INSULIN ASPART 100 UNIT/ML ~~LOC~~ SOLN
0.0000 [IU] | Freq: Three times a day (TID) | SUBCUTANEOUS | Status: DC
  Administered 2019-03-24 (×2): 1 [IU] via SUBCUTANEOUS
  Administered 2019-03-25: 7 [IU] via SUBCUTANEOUS
  Administered 2019-03-25: 13:00:00 2 [IU] via SUBCUTANEOUS
  Administered 2019-03-26: 3 [IU] via SUBCUTANEOUS
  Administered 2019-03-26: 2 [IU] via SUBCUTANEOUS
  Filled 2019-03-24 (×6): qty 1

## 2019-03-24 MED ORDER — FUROSEMIDE 10 MG/ML IJ SOLN
80.0000 mg | Freq: Once | INTRAMUSCULAR | Status: AC
  Administered 2019-03-24: 80 mg via INTRAVENOUS
  Filled 2019-03-24: qty 8

## 2019-03-24 MED ORDER — IPRATROPIUM-ALBUTEROL 0.5-2.5 (3) MG/3ML IN SOLN
3.0000 mL | Freq: Three times a day (TID) | RESPIRATORY_TRACT | Status: DC
  Administered 2019-03-24 – 2019-03-26 (×6): 3 mL via RESPIRATORY_TRACT
  Filled 2019-03-24 (×6): qty 3

## 2019-03-24 NOTE — Plan of Care (Signed)
  Problem: Clinical Measurements: Goal: Diagnostic test results will improve Outcome: Progressing Goal: Cardiovascular complication will be avoided Outcome: Progressing   Problem: Nutrition: Goal: Adequate nutrition will be maintained Outcome: Progressing   

## 2019-03-24 NOTE — Consult Note (Signed)
ANTICOAGULATION CONSULT NOTE -   Pharmacy Consult for Heparin Drip Indication: atrial fibrillation  Patient Measurements: Height: 5\' 1"  (154.9 cm) Weight: 192 lb 0.3 oz (87.1 kg) IBW/kg (Calculated) : 47.8 Heparin Dosing Weight: 66.1kg  Vital Signs: Temp: 98.7 F (37.1 C) (11/28 2000) Temp Source: Oral (11/28 2000) BP: 124/44 (11/29 0000) Pulse Rate: 93 (11/29 0000)  Labs: Recent Labs    03/21/19 0549 03/21/19 1523 03/21/19 1554 03/21/19 1736  03/22/19 0442 03/22/19 0555 03/22/19 0853 03/22/19 1936 03/23/19 0558 03/23/19 2155  HGB 11.4*  --   --   --   --  11.8*  --   --   --  10.8*  --   HCT 34.5*  --   --   --   --  35.6*  --   --   --  33.5*  --   PLT 85*  --   --   --   --  106*  --   --   --  95*  --   APTT  --   --  26  --   --   --   --   --   --   --   --   LABPROT 13.3  --   --   --   --   --   --   --   --   --   --   INR 1.0  --   --   --   --   --   --   --   --   --   --   HEPARINUNFRC  --   --   --   --    < >  --   --  0.81* 0.14*  --  0.20*  CREATININE 1.05*  --   --   --   --   --  1.03*  --   --  0.98  --   TROPONINIHS  --  22*  --  25*  --   --   --   --   --   --   --    < > = values in this interval not displayed.    Estimated Creatinine Clearance: 46.7 mL/min (by C-G formula based on SCr of 0.98 mg/dL).   Medical History: Past Medical History:  Diagnosis Date  . Anemia   . Arthritis   . Asthma   . CHF (congestive heart failure) (HCC)   . Chronic kidney disease    stage III  . COPD (chronic obstructive pulmonary disease) (HCC)    occasional home O2 use  . Depression   . Diabetes mellitus without complication (HCC)   . Dysrhythmia    bradycardia  . GERD (gastroesophageal reflux disease)   . Glaucoma   . Hx of Clostridium difficile infection March 2016  . Hyperlipidemia   . Hypertension   . Hypothyroidism   . Obstructive sleep apnea   . Sciatica   . Venous insufficiency     Medications:  No PTA anticoagulation of  note  Assessment: Laurie Ross is a 78 y.o. female with a hx of diabetes, anemia, chronic kidney disease, COPD, hypertension, hyperlipidemia presenting with hypoglycemia, pharmacy consulted for heparin dosing for atrial fibrillation. The heparin infusion was d/c on 11/27. The last heparin level was 0.81 on 11/27 at 0853 and the infusion rate was reduced to 750 units/hr from 850 units/hr. Thrombocytopenia is noted to be chronic dating back to at least June 2016  with multiple data points.  It appears to be stable.   Goal of Therapy:  Heparin level 0.3-0.7 units/ml Monitor platelets by anticoagulation protocol: Yes   Plan:   restart heparin infusion at 750 units/hr: d/w Dr Sarajane Jews and bolus is being withheld due to thrombocytopenia  check Heparin Level (HL) in 8 hours per protocol and CBC daily while on heparin drip.  11/28 @ 2155 HL = 0.20, subtherapeutic, will increase rate to 850 units/hr and recheck in 8 hrs  Ena Dawley, PharmD Clinical Pharmacist 03/24/2019 12:24 AM

## 2019-03-24 NOTE — Plan of Care (Signed)
  Problem: Education: Goal: Knowledge of General Education information will improve Description: Including pain rating scale, medication(s)/side effects and non-pharmacologic comfort measures Outcome: Progressing   Problem: Clinical Measurements: Goal: Respiratory complications will improve Outcome: Progressing Goal: Cardiovascular complication will be avoided Outcome: Progressing   Problem: Safety: Goal: Ability to remain free from injury will improve Outcome: Progressing   

## 2019-03-24 NOTE — Progress Notes (Signed)
Received verbal order to give meds crushed in applesauce.

## 2019-03-24 NOTE — Progress Notes (Signed)
PROGRESS NOTE  Laurie Ross FAO:[id number] DOB: 1940-04-27 DOA: 03/20/2019 PCP: [facility] Dr. Dionicia Abler [facility] [phone]  Brief History   78 year old woman lives at home with her daughters, PMH COPD on 2 L nasal cannula, diabetes mellitus type 2, hypothyroidism, OSA presented to the hospital after being found on the floor at home.  Found laying on stomach.  Noted to be hypoglycemic by EMS and given D50.  Took routine dose of Lantus evening prior to presentation.  Patient lost one of her sisters last month and has had a poor appetite.  In the emergency department had 2 episodes of hypoglycemia.  Admitted for further evaluation of hypoglycemia.  Hypoglycemia resolved but she developed atrial fibrillation with rapid ventricular response and massive volume overload secondary to acute CHF.  Respiratory status decompensated she was transferred to the ICU was placed on BiPAP.  Fortunately she began to diurese, was weaned off BiPAP during the day and transferred back to the medical floor.  At this point continue diuresis, rate control.  A & P  Acute on chronic hypoxic respiratory failure, acute hypercapnic respiratory failure with respiratory acidosis.  On 2 L at home.  SARS-CoV-2 negative.  Multifactorial including acute CHF, severe pulmonary hypertension, COPD.  Seen by pulmonology, care appreciated. --Much improved.  Now on 2 L nasal cannula again.  Continue BiPAP at night.  Atrial fibrillation with rapid ventricular response. CHA2DS2-VASc 6. Atrial fibrillation diagnosed by PCP approximately 10 days prior to presentation.  Treated with diltiazem infusion, IV heparin. --Rate control improved.  Change to long-acting diltiazem home dose 360 mg.  --Plan for apixaban prior to discharge if condition continues to improve point.  Continue heparin infusion  Acute on chronic diastolic CHF.  Thought to be secondary to rapid heart rate. --Urine output significantly improved.  -2.5  liters since admission. --Decreased lower extremity edema.  Still has significant upper extremity edema. --Foley catheter out.  Continue diuresis, monitor I/os.  COPD, chronic hypoxic respiratory failure on 2 L nasal cannula at home, severe pulmonary hypertension --Much improved.  Continue BiPAP at night.  Continue steroids, bronchodilators.  Left lower extremity cellulitis present on admission, sepsis considered on admission, now ruled out. Bilateral lower extremity Dopplers negative for DVT. --Appears nearly resolved.  Complete short course of oral antibiotics.  Demand ischemia secondary to atrial fibrillation with RVR and volume overload. --Asymptomatic. --Continue aspirin per cardiology, heparin infusion.  Outpatient evaluation to consider noninvasive ischemic evaluation.  Thrombocytopenia  --Chronic dating back to at least June 2016 with multiple data points.  --Improved today.  No further evaluation suggested.  Hypoglycemia.  Secondary to ongoing use of insulin at home complicated by poor oral intake in the context of recent loss of her sister. --Hypoglycemia resolved.  No further evaluation suggested.  Syncope secondary to hypoglycemia.  CT head negative.  Treated with D5 half-normal saline infusion. --Echocardiogram and CT head unremarkable.  No further evaluation suggested.  Hypothyroidism.  TSH within normal limits. --Continue levothyroxine.  CKD stage IIIa --Remains stable.  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  Hemoglobin A1c 6.0 11/25. --CBG remains stable.  No lows. --Continue Lantus 5 units daily and follow closely for recurrent hypoglycemia. --Continue sliding scale insulin. --Given hemoglobin A1c, may need large reduction of Lantus on discharge.  Depression --Continue Zoloft   Dementia.  Followed by [facility]. --Remains stable  Much improved today.  She still has evidence of volume overload but her heart rate is better controlled and diuresis has  improved.  We will continue diuretics, change to long-acting rate control.  Speech therapy evaluation advance diet per recommendations.  DVT prophylaxis: heparin infusion Code Status: DNR Family Communication: updated daughter [name] by telephone Disposition Plan: Physical and occupational therapy has recommended SNF   Brendia Sacks, MD  [facility] Direct contact: see [email or web address] (further directions at bottom of note if needed) 7PM-7AM contact night coverage as at bottom of note 03/24/2019, 12:41 PM  LOS: 3 days   Significant Hospital Events   . 11/25 admitted for hypoglycemia, syncope, left lower extremity cellulitis . 11/26 developed atrial fibrillation with rapid ventricular response . 11/26 cardiology consultation . 11/27 Foley catheter insertion . 11/27 worsening respiratory failure, transferred to ICU, started on BiPAP   Consults:  . Cardiology . Pulmonology   Procedures:   Echocardiogram LVEF 55 to 60%.  Indeterminate diastolic parameters left ventricle.  Severely elevated pulmonary artery systolic pressure.  11/27 large-volume thoracentesis 950 mL removed right chest Significant Diagnostic Tests:  . CT head no acute changes . Lower extremity venous Dopplers negative for DVT. Marland Kitchen Chest x-ray bilateral opacities and small pleural effusions.  To my eye there may be some slight improvement 11/27 compared to admission study 11/25   Micro Data:  . SARS Covid negative . Blood cultures pending   Antimicrobials:  .   Interval History/Subjective  No issues overnight.  Seems to be feeling better today.  Breathing okay.  No complaints.  Objective   Vitals:  Vitals:   03/24/19 0536 03/24/19 0719  BP: (!) 141/65 (!) 122/56  Pulse: 86 83  Resp: 20 19  Temp:  97.6 F (36.4 C)  SpO2: 95% 97%    Exam:  Constitutional.  Appears calm, comfortable.  Appears better today. Respiratory clear to auscultation bilaterally.  No wheezes, rales or rhonchi.  Normal  respiratory effort. Cardiovascular.  Irregular, normal rate.  No murmur, rub or gallop.  Lower extremity edema significantly decreased, trace now bilaterally.  Upper arm edema is still prominent. Abdomen.  Soft, nontender, nondistended. Skin.  Left lower extremity erythema has nearly resolved. Psychiatric.  Grossly normal mood and affect.  Speech fluent and appropriate.  I have personally reviewed the following:   Today's Data  . Urine output 2825.  -2.5 L since admission. . CBG stable . Potassium 3.4, remainder BMP unremarkable. . Platelets improving, 114.  Hemoglobin stable at 11.4.  No leukocytosis.  WBC within normal limits.  Scheduled Meds: . aspirin EC  81 mg Oral Daily  . budesonide (PULMICORT) nebulizer solution  0.25 mg Nebulization Q12H  . cephALEXin  500 mg Oral Q6H  . Chlorhexidine Gluconate Cloth  6 each Topical Daily  . diltiazem  90 mg Oral Q8H  . docusate sodium  100 mg Oral Daily  . feeding supplement (NEPRO CARB STEADY)  237 mL Oral BID BM  . ferrous sulfate  325 mg Oral Q M,W,F  . furosemide  80 mg Intravenous Q8H  . insulin aspart  0-9 Units Subcutaneous TID WC  . insulin glargine  5 Units Subcutaneous Daily  . ipratropium-albuterol  3 mL Nebulization Q6H  . levothyroxine  50 mcg Oral Daily  . mouth rinse  15 mL Mouth Rinse BID  . multivitamin with minerals  1 tablet Oral Daily  . mupirocin ointment  1 application Nasal BID  . pantoprazole  40 mg Oral Daily  . potassium chloride  20 mEq Oral BID  . sertraline  150 mg Oral QHS  . traZODone  100 mg Oral QHS  .  valACYclovir  500 mg Oral Daily   Continuous Infusions: . heparin 850 Units/hr (03/24/19 0200)    Principal Problem:   Atrial fibrillation with RVR (HCC) Active Problems:   Hypokalemia   Syncope   Hypoglycemia due to insulin   Type II diabetes mellitus with renal manifestations (HCC)   CKD stage G3a/A1, GFR 45-59 and albumin creatinine ratio <30 mg/g   Cellulitis of left leg   Hypothyroidism    GERD (gastroesophageal reflux disease)   Hypoglycemia   Thrombocytopenia (HCC)   Acute on chronic diastolic CHF (congestive heart failure) (HCC)   COPD (chronic obstructive pulmonary disease) (HCC)   Acute on chronic respiratory failure with hypoxia (HCC)   LOS: 3 days   How to contact the [facility] Attending or Consulting provider 7A - 7P or covering provider during after hours 7P -7A, for this patient?  1. Check the care team in [facility] and look for a) attending/consulting [facility] provider listed and b) the [facility] team listed 2. Log into [email or web address] and use [place]'s universal password to access. If you do not have the password, please contact the hospital operator. 3. Locate the [facility] provider you are looking for under [facility] and page to a number that you can be directly reached. 4. If you still have difficulty reaching the provider, please page the [facility] (Director on Call) for the Hospitalists listed on amion for assistance.

## 2019-03-24 NOTE — Consult Note (Signed)
ANTICOAGULATION CONSULT NOTE -   Pharmacy Consult for Heparin Drip Indication: atrial fibrillation  Patient Measurements: Height: 5\' 1"  (154.9 cm) Weight: 184 lb 11.2 oz (83.8 kg) IBW/kg (Calculated) : 47.8 Heparin Dosing Weight: 66.1kg  Vital Signs: Temp: 97.6 F (36.4 C) (11/29 0719) Temp Source: Oral (11/29 0719) BP: 122/56 (11/29 0719) Pulse Rate: 83 (11/29 0719)  Labs: Recent Labs    03/21/19 1523 03/21/19 1554 03/21/19 1736  03/22/19 0442 03/22/19 0555  03/22/19 1936 03/23/19 0558 03/23/19 2155 03/24/19 0622 03/24/19 0938  HGB  --   --   --    < > 11.8*  --   --   --  10.8*  --  11.4*  --   HCT  --   --   --   --  35.6*  --   --   --  33.5*  --  34.3*  --   PLT  --   --   --   --  106*  --   --   --  95*  --  114*  --   APTT  --  26  --   --   --   --   --   --   --   --   --   --   HEPARINUNFRC  --   --   --    < >  --   --    < > 0.14*  --  0.20*  --  0.37  CREATININE  --   --   --   --   --  1.03*  --   --  0.98  --  0.94  --   TROPONINIHS 22*  --  25*  --   --   --   --   --   --   --   --   --    < > = values in this interval not displayed.    Estimated Creatinine Clearance: 47.7 mL/min (by C-G formula based on SCr of 0.94 mg/dL).   Medical History: Past Medical History:  Diagnosis Date  . Anemia   . Arthritis   . Asthma   . CHF (congestive heart failure) ([place])   . Chronic kidney disease    stage III  . COPD (chronic obstructive pulmonary disease) ([place])    occasional home O2 use  . Depression   . Diabetes mellitus without complication ([place])   . Dysrhythmia    bradycardia  . GERD (gastroesophageal reflux disease)   . Glaucoma   . Hx of Clostridium difficile infection March 2016  . Hyperlipidemia   . Hypertension   . Hypothyroidism   . Obstructive sleep apnea   . Sciatica   . Venous insufficiency     Medications:  No PTA anticoagulation of note  Assessment: Laurie Ross is a 78 y.o. female with a hx of diabetes, anemia, chronic  kidney disease, COPD, hypertension, hyperlipidemia presenting with hypoglycemia, pharmacy consulted for heparin dosing for atrial fibrillation. The heparin infusion was d/c on 11/27. The last heparin level was 0.81 on 11/27 at 0853 and the infusion rate was reduced to 750 units/hr from 850 units/hr. Thrombocytopenia is noted to be chronic dating back to at least June 2016 with multiple data points.  It appears to be stable.   restart heparin infusion at 750 units/hr: d/w Dr Sarajane Jews and bolus is being withheld due to thrombocytopenia  11/28 @ 2155 HL = 0.20, subtherapeutic, will increase rate to 850  units/hr  Goal of Therapy:  Heparin level 0.3-0.7 units/ml Monitor platelets by anticoagulation protocol: Yes   Plan:   11/29 @ 0938 HL = 0.37, therapeutic, will continue rate of 850 units/hr and check confirmatory level in 8 hrs  Angelique Blonder, PharmD Clinical Pharmacist 03/24/2019 10:36 AM

## 2019-03-24 NOTE — Progress Notes (Signed)
Progress Note  Patient Name: Laurie Ross Date of Encounter: 03/24/2019  Primary Cardiologist:  New   Subjective  Much improved. "I am breathing better."   Inpatient Medications    Scheduled Meds: . aspirin EC  81 mg Oral Daily  . budesonide (PULMICORT) nebulizer solution  0.25 mg Nebulization Q12H  . cephALEXin  500 mg Oral Q6H  . Chlorhexidine Gluconate Cloth  6 each Topical Daily  . diltiazem  90 mg Oral Q8H  . docusate sodium  100 mg Oral Daily  . feeding supplement (NEPRO CARB STEADY)  237 mL Oral BID BM  . ferrous sulfate  325 mg Oral Q M,W,F  . furosemide  80 mg Intravenous Q8H  . insulin aspart  0-9 Units Subcutaneous TID WC  . insulin glargine  5 Units Subcutaneous Daily  . ipratropium-albuterol  3 mL Nebulization Q6H  . levothyroxine  50 mcg Oral Daily  . mouth rinse  15 mL Mouth Rinse BID  . multivitamin with minerals  1 tablet Oral Daily  . mupirocin ointment  1 application Nasal BID  . pantoprazole  40 mg Oral Daily  . potassium chloride  20 mEq Oral BID  . sertraline  150 mg Oral QHS  . traZODone  100 mg Oral QHS  . valACYclovir  500 mg Oral Daily   Continuous Infusions: . heparin 850 Units/hr (03/24/19 0200)   PRN Meds: acetaminophen **OR** acetaminophen, calcium carbonate, loperamide, morphine injection, senna-docusate   Vital Signs    Vitals:   03/24/19 0200 03/24/19 0258 03/24/19 0536 03/24/19 0719  BP: (!) 153/67 (!) 161/51 (!) 141/65 (!) 122/56  Pulse: (!) 110 (!) 107 86 83  Resp: (!) 25 20 20 19   Temp: 98.8 F (37.1 C) 98.3 F (36.8 C)  97.6 F (36.4 C)  TempSrc: Axillary Oral  Oral  SpO2: 94% 94% 95% 97%  Weight:  83.8 kg    Height:  5\' 1"  (1.549 m)      Intake/Output Summary (Last 24 hours) at 03/24/2019 0816 Last data filed at 03/24/2019 03/26/2019 Gross per 24 hour  Intake 91.52 ml  Output 2700 ml  Net -2608.48 ml   Last 3 Weights 03/24/2019 03/23/2019 03/22/2019  Weight (lbs) 184 lb 11.2 oz 192 lb 0.3 oz 197 lb 5 oz   Weight (kg) 83.779 kg 87.1 kg 89.5 kg      Telemetry     atrial fib with a CVR/RVR- Personally Reviewed  ECG    none - Personally Reviewed  Physical Exam   GEN: Elderly appearing woman, no acute distress.   Neck: 7 cm JVD Cardiac: IRIRR, no murmurs, rubs, or gallops.  Respiratory: Clear to auscultation bilaterally except for basilar rales. GI: Soft, nontender, non-distended  MS: No edema; No deformity. Neuro:  Nonfocal  Psych: Normal affect   Labs    High Sensitivity Troponin:   Recent Labs  Lab 03/21/19 1523 03/21/19 1736  TROPONINIHS 22* 25*      Chemistry Recent Labs  Lab 03/20/19 0711 03/21/19 0549 03/22/19 0555 03/23/19 0558 03/24/19 0622  NA 142 142 143 144 144  K 3.2* 3.8 3.8 3.5 3.4*  CL 105 106 105 106 101  CO2 29 29 27 28 31   GLUCOSE 70 248* 209* 125* 154*  BUN 28* 22 25* 24* 22  CREATININE 1.15* 1.05* 1.03* 0.98 0.94  CALCIUM 8.8* 8.3* 8.6* 8.5* 8.5*  PROT 6.4* 5.4*  --  5.6*  --   ALBUMIN 3.0* 2.5*  --  2.6*  --  AST 19 16  --  15  --   ALT 17 16  --  16  --   ALKPHOS 79 64  --  59  --   BILITOT 0.8 0.6  --  0.6  --   GFRNONAA 45* 50* 52* 55* 58*  GFRAA 52* 58* 60* >60 >60  ANIONGAP 8 7 11 10 12      Hematology Recent Labs  Lab 03/22/19 0442 03/23/19 0558 03/24/19 0622  WBC 9.4 7.9 9.6  RBC 3.61* 3.29* 3.53*  HGB 11.8* 10.8* 11.4*  HCT 35.6* 33.5* 34.3*  MCV 98.6 101.8* 97.2  MCH 32.7 32.8 32.3  MCHC 33.1 32.2 33.2  RDW 14.6 14.3 14.5  PLT 106* 95* 114*    BNP Recent Labs  Lab 03/20/19 1638 03/22/19 0442  BNP 453.0* 617.0*     DDimer No results for input(s): DDIMER in the last 168 hours.   Radiology    US Renal  Result Date: 03/22/2019 CLINICAL DATA:  Oliguria. EXAM: RENAL / URINARY TRACT ULTRASOUND COMPLETE COMPARISON:  None. FINDINGS: Right Kidney: Renal measurements: 10.4 x 5.2 x 5.1 cm = volume: 143 mL . Echogenicity within normal limits. No mass or hydronephrosis visualized. Left Kidney: Renal measurements:  10.1 x 4.8 x 4.0 cm = volume: 101 mL. Echogenicity within normal limits. No mass or hydronephrosis visualized. Bladder: Decompressed secondary to Foley catheter. Other: None. IMPRESSION: Normal renal ultrasound. Electronically Signed   By: Marijo Conception M.D.   On: 03/22/2019 17:45   Dg Chest Port 1 View  Result Date: 03/23/2019 CLINICAL DATA:  Acute respiratory failure EXAM: PORTABLE CHEST 1 VIEW COMPARISON:  Chest x-rays dated 03/22/2019 FINDINGS: Heart size and mediastinal contours are stable. Stable opacity at the LEFT lung base, likely atelectasis and/or small pleural effusion. Probable mild atelectasis and/or small pleural effusion at the RIGHT lung base. No pneumothorax seen. No new lung findings. IMPRESSION: No new lung findings. Probable mild bibasilar atelectasis and/or small pleural effusions. Electronically Signed   By: Franki Cabot M.D.   On: 03/23/2019 07:40   Dg Chest Port 1 View  Result Date: 03/22/2019 CLINICAL DATA:  Status post thoracentesis EXAM: PORTABLE CHEST 1 VIEW COMPARISON:  03/22/2019, 2:57 a.m. FINDINGS: Status post right thoracentesis with near complete resolution of a small right pleural effusion. No significant pneumothorax. Unchanged small left pleural effusion. Unchanged diffuse interstitial opacity, likely edema. Cardiomegaly. IMPRESSION: Status post right thoracentesis with near complete resolution of a small right pleural effusion. No significant pneumothorax. Electronically Signed   By: Eddie Candle M.D.   On: 03/22/2019 16:34   US Thoracentesis Asp Pleural Space W/img Guide  Result Date: 03/22/2019 INDICATION: Shortness of breath.  Pleural effusions. EXAM: ULTRASOUND GUIDED RIGHT THORACENTESIS MEDICATIONS: None. COMPLICATIONS: None immediate. PROCEDURE: An ultrasound guided thoracentesis was thoroughly discussed with the patient's daughter on the telephone. The benefits, risks, alternatives and complications were also discussed. Informed consent was obtained.  Ultrasound was performed to localize and mark an adequate pocket of fluid in the right chest. The area was then prepped and draped in the normal sterile fashion. 1% Lidocaine was used for local anesthesia. Under ultrasound guidance a 6 Fr Safe-T-Centesis catheter was introduced. Thoracentesis was performed. The catheter was removed and a dressing applied. FINDINGS: A total of approximately 950 mL of yellow fluid was removed. Samples were sent to the laboratory as requested by the clinical team. IMPRESSION: Successful ultrasound guided right thoracentesis yielding 950 mL of pleural fluid. Electronically Signed   By: Markus Daft  M.D.   On: 03/22/2019 16:22    Cardiac Studies  Echo  03/21/19 Left ventricular ejection fraction, by visual estimation, is 55 to 60%. The left ventricle has normal function. There is no left ventricular hypertrophy. 2. Left ventricular diastolic parameters are indeterminate. 3. Global right ventricle has normal systolic function.The right ventricular size is normal. No increase in right ventricular wall thickness. 4. Left atrial size was mild-moderately dilated. 5. Tricuspid valve regurgitation mild-moderate. 6. The aortic valve was not well visualized. Moderately calcified. No valve gradient measurements obtained (consider repeat study at later date for evaluation). Aortic valve regurgitation is not visualized. 7. Severely elevated pulmonary artery systolic pressure. 8. Left pleural effusion noted, 8 cm 9. The tricuspid regurgitant velocity is 4.13 m/s, and with an assumed right atrial pressure of 10 mmHg, the estimated right ventricular systolic pressure is severely elevated at 78.2 mmHg. 10. Rhythm is atrial fibrillation.  Patient Profile     [name] is a 78 y.o. female with a hx of diabetes, anemia, chronic kidney disease, COPD, hypertension, hyperlipidemia presenting with hypoglycemia, cardiology consulted for atrial fibrillation   Assessment & Plan     1  Atrial fibrillation with RVR - her VR is much better. Consider switching cardizem to once daily 240 mg and oral toprol XL to 50 mg daily  2  Acute diastolic CHF - IV lasix as needed. She is maintaining her O2 Sat at present. She is s/p thoracentesis. Her symptoms are much improved from yesterday 3. Pulmonary hypotension - She will need additional eval as to the etiology.   For questions or updates, please contact [facility] Please consult [email or web address] for contact info under   Signed, Lewayne BuntingGregg Monay Houlton, MD  03/24/2019, 8:16 AM

## 2019-03-24 NOTE — Consult Note (Signed)
ANTICOAGULATION CONSULT NOTE -   Pharmacy Consult for Heparin Drip Indication: atrial fibrillation  Patient Measurements: Height: 5\' 1"  (154.9 cm) Weight: 184 lb 11.2 oz (83.8 kg) IBW/kg (Calculated) : 47.8 Heparin Dosing Weight: 66.1kg  Vital Signs: Temp: 98.3 F (36.8 C) (11/29 1618) Temp Source: Oral (11/29 1618) BP: 135/71 (11/29 1618) Pulse Rate: 92 (11/29 1618)  Labs: Recent Labs    03/22/19 0442 03/22/19 0555  03/23/19 0558 03/23/19 2155 03/24/19 0622 03/24/19 0938 03/24/19 1742  HGB 11.8*  --   --  10.8*  --  11.4*  --   --   HCT 35.6*  --   --  33.5*  --  34.3*  --   --   PLT 106*  --   --  95*  --  114*  --   --   HEPARINUNFRC  --   --    < >  --  0.20*  --  0.37 0.30  CREATININE  --  1.03*  --  0.98  --  0.94  --   --    < > = values in this interval not displayed.    Estimated Creatinine Clearance: 47.7 mL/min (by C-G formula based on SCr of 0.94 mg/dL).   Medical History: Past Medical History:  Diagnosis Date  . Anemia   . Arthritis   . Asthma   . CHF (congestive heart failure) ([place])   . Chronic kidney disease    stage III  . COPD (chronic obstructive pulmonary disease) ([place])    occasional home O2 use  . Depression   . Diabetes mellitus without complication ([place])   . Dysrhythmia    bradycardia  . GERD (gastroesophageal reflux disease)   . Glaucoma   . Hx of Clostridium difficile infection March 2016  . Hyperlipidemia   . Hypertension   . Hypothyroidism   . Obstructive sleep apnea   . Sciatica   . Venous insufficiency     Medications:  No PTA anticoagulation of note  Assessment: Laurie Ross is a 78 y.o. female with a hx of diabetes, anemia, chronic kidney disease, COPD, hypertension, hyperlipidemia presenting with hypoglycemia, pharmacy consulted for heparin dosing for atrial fibrillation. The heparin infusion was d/c on 11/27. The last heparin level was 0.81 on 11/27 at 0853 and the infusion rate was reduced to 750 units/hr from  850 units/hr. Thrombocytopenia is noted to be chronic dating back to at least June 2016 with multiple data points.  It appears to be stable.   restart heparin infusion at 750 units/hr: d/w Dr Sarajane Jews and bolus is being withheld due to thrombocytopenia  11/28 @ 2155 HL = 0.20, subtherapeutic, will increase rate to 850 units/hr 11/29 @ 0938 HL = 0.37, therapeutic, will continue rate of 850 units/hr   Goal of Therapy:  Heparin level 0.3-0.7 units/ml Monitor platelets by anticoagulation protocol: Yes   Plan:   11/29@1742 : HL 0.30, therapeutic but trending down. Will increase rate to 900 units/hr and recheck level with AM labs as consecutive levels have been within desired limit.  Pearla Dubonnet, PharmD Clinical Pharmacist 03/24/2019 6:58 PM

## 2019-03-25 LAB — GLUCOSE, CAPILLARY
Glucose-Capillary: 22 mg/dL — CL (ref 70–99)
Glucose-Capillary: 110 mg/dL — ABNORMAL HIGH (ref 70–99)
Glucose-Capillary: 207 mg/dL — ABNORMAL HIGH (ref 70–99)
Glucose-Capillary: 314 mg/dL — ABNORMAL HIGH (ref 70–99)
Glucose-Capillary: 179 mg/dL — ABNORMAL HIGH (ref 70–99)

## 2019-03-25 LAB — CBC
HCT: 35.6 % — ABNORMAL LOW (ref 36.0–46.0)
Hemoglobin: 11.6 g/dL — ABNORMAL LOW (ref 12.0–15.0)
MCH: 32 pg (ref 26.0–34.0)
MCHC: 32.6 g/dL (ref 30.0–36.0)
MCV: 98.3 fL (ref 80.0–100.0)
Platelets: 109 10*3/uL — ABNORMAL LOW (ref 150–400)
RBC: 3.62 MIL/uL — ABNORMAL LOW (ref 3.87–5.11)
RDW: 14.7 % (ref 11.5–15.5)
WBC: 8.8 10*3/uL (ref 4.0–10.5)
nRBC: 0 % (ref 0.0–0.2)

## 2019-03-25 LAB — BASIC METABOLIC PANEL
Anion gap: 13 (ref 5–15)
BUN: 24 mg/dL — ABNORMAL HIGH (ref 8–23)
CO2: 32 mmol/L (ref 22–32)
Calcium: 8.1 mg/dL — ABNORMAL LOW (ref 8.9–10.3)
Chloride: 103 mmol/L (ref 98–111)
Creatinine, Ser: 0.99 mg/dL (ref 0.44–1.00)
GFR calc Af Amer: >60 mL/min (ref >60)
GFR calc non Af Amer: 54 mL/min — ABNORMAL LOW (ref >60)
Glucose, Bld: 102 mg/dL — ABNORMAL HIGH (ref 70–99)
Potassium: 4 mmol/L (ref 3.5–5.1)
Sodium: 148 mmol/L — ABNORMAL HIGH (ref 135–145)

## 2019-03-25 LAB — BRAIN NATRIURETIC PEPTIDE: B Natriuretic Peptide: 398 pg/mL — ABNORMAL HIGH (ref 0.0–100.0)

## 2019-03-25 LAB — CULTURE, BLOOD (ROUTINE X 2)
Culture: NO GROWTH
Culture: NO GROWTH
Special Requests: ADEQUATE
Special Requests: ADEQUATE

## 2019-03-25 LAB — HEPARIN LEVEL (UNFRACTIONATED): Heparin Unfractionated: 0.37 IU/mL (ref 0.30–0.70)

## 2019-03-25 MED ORDER — FUROSEMIDE 10 MG/ML IJ SOLN
40.0000 mg | Freq: Two times a day (BID) | INTRAMUSCULAR | Status: DC
  Administered 2019-03-25 – 2019-03-26 (×2): 40 mg via INTRAVENOUS
  Filled 2019-03-25: qty 4

## 2019-03-25 MED ORDER — METOPROLOL TARTRATE 25 MG PO TABS
25.0000 mg | ORAL_TABLET | Freq: Two times a day (BID) | ORAL | Status: DC
  Administered 2019-03-25 – 2019-03-26 (×3): 25 mg via ORAL
  Filled 2019-03-25 (×3): qty 1

## 2019-03-25 MED ORDER — APIXABAN 5 MG PO TABS
5.0000 mg | ORAL_TABLET | Freq: Two times a day (BID) | ORAL | Status: DC
  Administered 2019-03-25 – 2019-03-26 (×3): 5 mg via ORAL
  Filled 2019-03-25 (×3): qty 1

## 2019-03-25 NOTE — Progress Notes (Signed)
OT Cancellation Note  Patient Details Name: Laurie Ross MRN: [id number] DOB: 1941/02/20   Cancelled Treatment:    Reason Eval/Treat Not Completed: Patient declined, no reason specified Attempted to see patient for OT session but pt declined and was insistent on eating and did not want to do anything until she ate something.  Will not able to come back due to time frame.  Will attempt again this week.  Chrys Racer, OTR/L, NTMTC ascom [phone] 03/25/19, 4:12 PM

## 2019-03-25 NOTE — TOC Progression Note (Signed)
Transition of Care [facility]) - Progression Note    Patient Details  Name: Laurie Ross MRN: [id number] Date of Birth: 11-30-1940  Transition of Care [facility]) CM/SW Contact  [name], [place] Phone Number: 03/25/2019, 5:28 PM  Clinical Narrative:    CSW spoke to Dr. Lavone Neri from [place] program.  She said that patient's base line is 2 assist, and she has equipment and oxygen at home already.  Dr. Lavone Neri, would like patient to return home and they will increase the amount of visits she gets and bring her to the [place] program more frequently instead of going to SNF.  Dr. Lavone Neri, requested the attending physician to call her to discuss discharge planning for patient, CSW sent message to physician.  CSW to continue to follow patient's progress throughout discharge planning.    Expected Discharge Plan: (Home with [facility])    Expected Discharge Plan and Services Expected Discharge Plan: (Home with [facility])   Discharge Planning Services: CM Consult   Living arrangements for the past 2 months: Single Family Home                                       Social Determinants of Health (SDOH) Interventions    Readmission Risk Interventions Readmission Risk Prevention Plan 03/22/2019 10/23/2018  Transportation Screening Complete Complete  PCP or Specialist Appt within 3-5 Days - Complete  HRI or [place] - Complete  Social Work Consult for [place] Planning/Counseling - Complete  Palliative Care Screening - Not Applicable  Medication Review Press photographer) Complete Complete  Some recent data might be hidden

## 2019-03-25 NOTE — Progress Notes (Signed)
PT Cancellation Note  Patient Details Name: Laurie Ross MRN: [id number] DOB: [birth year]   Cancelled Treatment:    Reason Eval/Treat Not Completed: Other (comment); Pt declined participation with rehab services until she had eaten something. Will attempt to see pt at a future date/time as medically appropriate.     Linus Salmons PT, DPT 03/25/19, 6:05 PM

## 2019-03-25 NOTE — Progress Notes (Signed)
Progress Note  Patient Name: Laurie Ross Date of Encounter: 03/25/2019  Primary Cardiologist: [facility], Dr. Mariah MillingGollan  Subjective   Patient denies CP, SOB, racing HR, or palpitations. Reports she feels she is breathing well but also notes usual home oxygen as 2L and currently on greater than 2L  oxygen.   Unsure of baseline mental status of patient and thus ability of patient to provide ROS-she is able to answer yes / no to questions.  Inpatient Medications    Scheduled Meds: . aspirin EC  81 mg Oral Daily  . budesonide (PULMICORT) nebulizer solution  0.25 mg Nebulization Q12H  . cephALEXin  500 mg Oral Q6H  . Chlorhexidine Gluconate Cloth  6 each Topical Daily  . diltiazem  360 mg Oral Daily  . docusate sodium  100 mg Oral Daily  . feeding supplement (NEPRO CARB STEADY)  237 mL Oral BID BM  . ferrous sulfate  325 mg Oral Q M,W,F  . insulin aspart  0-9 Units Subcutaneous TID WC  . insulin glargine  5 Units Subcutaneous Daily  . ipratropium-albuterol  3 mL Nebulization TID  . levothyroxine  50 mcg Oral Daily  . mouth rinse  15 mL Mouth Rinse BID  . multivitamin with minerals  1 tablet Oral Daily  . mupirocin ointment  1 application Nasal BID  . pantoprazole  40 mg Oral Daily  . potassium chloride  20 mEq Oral BID  . sertraline  150 mg Oral QHS  . traZODone  100 mg Oral QHS  . valACYclovir  500 mg Oral Daily   Continuous Infusions: . heparin 900 Units/hr (03/24/19 2104)   PRN Meds: acetaminophen **OR** acetaminophen, calcium carbonate, loperamide, morphine injection, senna-docusate   Vital Signs    Vitals:   03/24/19 2001 03/24/19 2008 03/25/19 0500 03/25/19 0725  BP: 136/70  (!) 143/87 (!) 131/51  Pulse: (!) 110  (!) 107 (!) 105  Resp: 18  20 20   Temp: 98.1 F (36.7 C)  98.4 F (36.9 C) 98.5 F (36.9 C)  TempSrc: Oral  Oral Oral  SpO2: 94% 94% 98% 98%  Weight:   83 kg   Height:        Intake/Output Summary (Last 24 hours) at 03/25/2019 1215 Last  data filed at 03/24/2019 2316 Gross per 24 hour  Intake 108.36 ml  Output 1200 ml  Net -1091.64 ml   Last 3 Weights 03/25/2019 03/24/2019 03/23/2019  Weight (lbs) 182 lb 14.4 oz 184 lb 11.2 oz 192 lb 0.3 oz  Weight (kg) 82.963 kg 83.779 kg 87.1 kg      Telemetry    Atrial fibrillation with rates occasionally up to high 140s but mostly in the 120s- Personally Reviewed  ECG    No new tracings - Personally Reviewed  Physical Exam   GEN: No acute distress.  Lying in bed Neck: JVD difficult to assess due to body habitus Cardiac: IRIR, 2/6 systolic murmur best heard LUSB, no rubs, or gallops.  Respiratory: Poor inspiratory effort, reduced breath sounds bilaterally and worse at the bilateral bases, auscultation performed anteriorly at the bases. GI: Soft, nontender, non-distended  MS: No edema; No deformity. Neuro:  Intermittently confused Psych: Normal affect   Labs    High Sensitivity Troponin:   Recent Labs  Lab 03/21/19 1523 03/21/19 1736  TROPONINIHS 22* 25*      Cardiac EnzymesNo results for input(s): TROPONINI in the last 168 hours. No results for input(s): TROPIPOC in the last 168 hours.  Chemistry Recent Labs  Lab 03/20/19 0711 03/21/19 0549  03/23/19 0558 03/24/19 0622 03/25/19 0531  NA 142 142   < > 144 144 148*  K 3.2* 3.8   < > 3.5 3.4* 4.0  CL 105 106   < > 106 101 103  CO2 29 29   < > 28 31 32  GLUCOSE 70 248*   < > 125* 154* 102*  BUN 28* 22   < > 24* 22 24*  CREATININE 1.15* 1.05*   < > 0.98 0.94 0.99  CALCIUM 8.8* 8.3*   < > 8.5* 8.5* 8.1*  PROT 6.4* 5.4*  --  5.6*  --   --   ALBUMIN 3.0* 2.5*  --  2.6*  --   --   AST 19 16  --  15  --   --   ALT 17 16  --  16  --   --   ALKPHOS 79 64  --  59  --   --   BILITOT 0.8 0.6  --  0.6  --   --   GFRNONAA 45* 50*   < > 55* 58* 54*  GFRAA 52* 58*   < > >60 >60 >60  ANIONGAP 8 7   < > 10 12 13    < > = values in this interval not displayed.     Hematology Recent Labs  Lab 03/23/19 0558  03/24/19 0622 03/25/19 0531  WBC 7.9 9.6 8.8  RBC 3.29* 3.53* 3.62*  HGB 10.8* 11.4* 11.6*  HCT 33.5* 34.3* 35.6*  MCV 101.8* 97.2 98.3  MCH 32.8 32.3 32.0  MCHC 32.2 33.2 32.6  RDW 14.3 14.5 14.7  PLT 95* 114* 109*    BNP Recent Labs  Lab 03/20/19 1638 03/22/19 0442  BNP 453.0* 617.0*     DDimer No results for input(s): DDIMER in the last 168 hours.   Radiology    No results found.  Cardiac Studies   Echo 03/21/19  1. Left ventricular ejection fraction, by visual estimation, is 55 to 60%. The left ventricle has normal function. There is no left ventricular hypertrophy.  2. Left ventricular diastolic parameters are indeterminate.  3. Global right ventricle has normal systolic function.The right ventricular size is normal. No increase in right ventricular wall thickness.  4. Left atrial size was mild-moderately dilated.  5. Tricuspid valve regurgitation mild-moderate.  6. The aortic valve was not well visualized. Moderately calcified. No valve gradient measurements obtained (consider repeat study at later date for evaluation). Aortic valve regurgitation is not visualized.  7. Severely elevated pulmonary artery systolic pressure.  8. Left pleural effusion noted, 8 cm  9. The tricuspid regurgitant velocity is 4.13 m/s, and with an assumed right atrial pressure of 10 mmHg, the estimated right ventricular systolic pressure is severely elevated at 78.2 mmHg. 10. Rhythm is atrial fibrillation.  Patient Profile     78 y.o. female with history of IDDM, CKDII, anemia, thrombocytopenia, COPD< HTN, HLD, hypothyroidism, and OSA who is being seen for new onset Afib with RVR of uncertain   Assessment & Plan    New onset Afib with RVR --No racing HR, palpitations. Uncertain time of onset. --Remains in Afib with rates in 120s, rare increase to high 140s. Continue Cardizem 360mg  daily. Rates still remain sub-optimal at this time Could consider addition of digoxin given stable renal  function or addition of lopressor with consolidation to Toprol as previously noted and if BP allows.  --If rates remain difficult to  control, may need to consider cardioversion as an outpatient and with consideration of long term goals / mental status.  --CHA2DS2VASc score of at least  6 (CHF, HTN, age x2, DM2, female). Currently on IV heparin with plan to transition to [facility] at discharge with Elqiuis 5mg .  Acute on chronic diastolic heart failure, Pulmonary HTN --Reports no SOB though also notes less [place] oxygen at home (2L home, 3L at [facility]). Echo as above. EF normal. Severe pulmonary HTN with bilateral pleural effusions.  --S/p thoracentesis with reported improvement in breathing status. Removed 950cc from right chest on review of procedure note 11/27.  --Continue to monitor I/O, daily weights. Wt 202lbs (11/27)  182.9lbs (11/30). Net -3.5L for the admission and -1.1L over the last 24 hours.   --Daily BMET. Replete electrolytes with K goal 4.0 and Mg goal 2.0. --Volume status improving and will recheck BNP to trend. Continue diuresis as renal function tolerates and given continued output with stable renal function with plan to transition to oral diuresis before discharge.  --As previously noted, would benefit from further workup of pulmonary HTN. Continue breathing treatments / BiPAP as needed. Consider pulmonology / pulmonary hypertension clinic referral.    Tricuspid regurgitation, mild to moderate --Continue to monitor with periodic outpatient echo. Could contribute to SOB. Most recent echo as above.   For questions or updates, please contact [place] Please consult [email or web address] for contact info under        Signed, Arvil Chaco, PA-C  03/25/19  1:14 PM

## 2019-03-25 NOTE — Progress Notes (Addendum)
PROGRESS NOTE  Laurie Ross ZOX:[id number] DOB: 1941/01/30 DOA: 03/20/2019 PCP: [facility] Dr. Malachi Paradise [facility] [phone]  Brief History   78 year old woman lives at home with her daughters, PMH COPD on 2 L nasal cannula, diabetes mellitus type 2, hypothyroidism, OSA presented to the hospital after being found on the floor at home.  Found laying on stomach.  Noted to be hypoglycemic by EMS and given D50.  Took routine dose of Lantus evening prior to presentation.  Patient lost one of her sisters last month and has had a poor appetite.  In the emergency department had 2 episodes of hypoglycemia.  Admitted for further evaluation of hypoglycemia.  Hypoglycemia resolved but she developed atrial fibrillation with rapid ventricular response and massive volume overload secondary to acute CHF.  Respiratory status decompensated she was transferred to the ICU was placed on BiPAP.  Fortunately she began to diurese, was weaned off BiPAP during the day and transferred back to the medical floor.  At this point continue diuresis, rate control.  A & P  Acute on chronic hypoxic respiratory failure, acute hypercapnic respiratory failure with respiratory acidosis.  On 2 L at home.  SARS-CoV-2 negative.  Multifactorial including acute CHF, severe pulmonary hypertension, COPD.  Seen by pulmonology, care appreciated. --Continues to slowly improve, continue supplemental oxygen, BiPAP at night.  Atrial fibrillation with rapid ventricular response. CHA2DS2-VASc 6. Atrial fibrillation diagnosed by PCP approximately 10 days prior to presentation.  Treated with diltiazem infusion, IV heparin. --Rate control variable.  Continue long-acting diltiazem.  Further recommendations per cardiology.   --Plan for apixaban prior to discharge if condition continues to improve point.  Continue heparin infusion  Acute on chronic diastolic CHF.  Thought to be secondary to rapid heart rate. --Diuresing adequately  now.  Lower extremity edema appears resolved.  Upper extremity edema significantly decreased. --Probably change to oral diuresis tomorrow  Dysphagia --Dysphagia level 2 (MINCED w/ gravies for easier mastication d/t Edentulous status Baseline); thin liquids. General aspiration precautions; tray setup and support at meals as needed d/t overall weakness. Recommend Pills in Puree for safer swallowing d/t overall illness.   COPD, chronic hypoxic respiratory failure on 2 L nasal cannula at home, severe pulmonary hypertension --Appears resolved.  Continue BiPAP at night.  Wean steroids, continue bronchodilators.  Left lower extremity cellulitis present on admission, sepsis considered on admission, now ruled out. Bilateral lower extremity Dopplers negative for DVT. --resolved.  Demand ischemia secondary to atrial fibrillation with RVR and volume overload. --Remains asymptomatic.   --Continue aspirin per cardiology, heparin infusion.  Outpatient evaluation to consider noninvasive ischemic evaluation.  Thrombocytopenia  --Chronic dating back to at least June 2016 with multiple data points.  --Stable.  Follow-up as an outpatient.  Hypoglycemia.  Secondary to ongoing use of insulin at home complicated by poor oral intake in the context of recent loss of her sister. --Hypoglycemia resolved.  No further evaluation suggested.  Syncope secondary to hypoglycemia.  CT head negative.  Treated with D5 half-normal saline infusion. --Echocardiogram and CT head unremarkable.  No further evaluation suggested.  Hypothyroidism.  TSH within normal limits. --Continue levothyroxine.  CKD stage IIIa --Remains stable.  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  Hemoglobin A1c 6.0 11/25. --CBG remains stable.  No hypoglycemic episodes. --Continue Lantus 5 units daily and follow closely for recurrent hypoglycemia. --Continue sliding scale insulin. --Given hemoglobin A1c, may need large reduction of Lantus on  discharge.  Depression --Continue Zoloft   Dementia.  Followed by [facility]. --  Stable.  DVT prophylaxis: heparin infusion Code Status: DNR Family Communication: updated daughter [name] by telephone again 11/30 Disposition Plan: Physical and occupational therapy has recommended SNF but daughter [name] refuses and plans to take her home     Murray Hodgkins, MD  [facility] Direct contact: see [email or web address] (further directions at bottom of note if needed) 7PM-7AM contact night coverage as at bottom of note 03/25/2019, 4:17 PM  LOS: 4 days   Significant Hospital Events   . 11/25 admitted for hypoglycemia, syncope, left lower extremity cellulitis . 11/26 developed atrial fibrillation with rapid ventricular response . 11/26 cardiology consultation . 11/27 Foley catheter insertion . 11/27 worsening respiratory failure, transferred to ICU, started on BiPAP   Consults:  . Cardiology . Pulmonology   Procedures:   Echocardiogram LVEF 55 to 60%.  Indeterminate diastolic parameters left ventricle.  Severely elevated pulmonary artery systolic pressure.  11/27 large-volume thoracentesis 950 mL removed right chest Significant Diagnostic Tests:  . CT head no acute changes . Lower extremity venous Dopplers negative for DVT. Marland Kitchen Chest x-ray bilateral opacities and small pleural effusions.  To my eye there may be some slight improvement 11/27 compared to admission study 11/25   Micro Data:  . SARS Covid negative . Blood cultures pending   Antimicrobials:  .   Interval History/Subjective  No issues charted overnight.  Seems to be feeling fine and breathing okay.  No complaints.  Objective   Vitals:  Vitals:   03/25/19 0725 03/25/19 1526  BP: (!) 131/51 118/78  Pulse: (!) 105 100  Resp: 20 18  Temp: 98.5 F (36.9 C) 98.3 F (36.8 C)  SpO2: 98% 96%    Exam:  Constitutional.  Appears calm and comfortable.  Slightly confused. Cardiovascular.  Regular rate and rhythm.  No  murmur, rub or gallop. Respiratory.  Clear to auscultation bilaterally.  No wheezes, rales or rhonchi.  Normal respiratory effort. Psychiatric.  Appears slightly confused.  Able to participate in conversation and responds to some questions appropriately. No significant lower extremity edema.  Right upper extremity edema nearly resolved.  Left upper extremity edema resolving.  I have personally reviewed the following:   Today's Data  . Urine output 1200.  -3.5 L since admission. . CBG remains stable.  BMP unremarkable.  Hemoglobin stable 11.6.  Platelets slightly lower at 109.  BNP has decreased, 398.  Scheduled Meds: . apixaban  5 mg Oral BID  . budesonide (PULMICORT) nebulizer solution  0.25 mg Nebulization Q12H  . cephALEXin  500 mg Oral Q6H  . Chlorhexidine Gluconate Cloth  6 each Topical Daily  . diltiazem  360 mg Oral Daily  . docusate sodium  100 mg Oral Daily  . feeding supplement (NEPRO CARB STEADY)  237 mL Oral BID BM  . ferrous sulfate  325 mg Oral Q M,W,F  . furosemide  40 mg Intravenous BID  . insulin aspart  0-9 Units Subcutaneous TID WC  . insulin glargine  5 Units Subcutaneous Daily  . ipratropium-albuterol  3 mL Nebulization TID  . levothyroxine  50 mcg Oral Daily  . mouth rinse  15 mL Mouth Rinse BID  . metoprolol tartrate  25 mg Oral BID  . multivitamin with minerals  1 tablet Oral Daily  . mupirocin ointment  1 application Nasal BID  . pantoprazole  40 mg Oral Daily  . potassium chloride  20 mEq Oral BID  . sertraline  150 mg Oral QHS  . traZODone  100 mg Oral QHS  .  valACYclovir  500 mg Oral Daily   Continuous Infusions:   Principal Problem:   Atrial fibrillation with RVR (HCC) Active Problems:   Hypokalemia   Syncope   Hypoglycemia due to insulin   Type II diabetes mellitus with renal manifestations (HCC)   CKD stage G3a/A1, GFR 45-59 and albumin creatinine ratio <30 mg/g   Cellulitis of left leg   Hypothyroidism   GERD (gastroesophageal reflux  disease)   Hypoglycemia   Thrombocytopenia (HCC)   Acute on chronic diastolic CHF (congestive heart failure) (HCC)   COPD (chronic obstructive pulmonary disease) (HCC)   Acute on chronic respiratory failure with hypoxia (HCC)   LOS: 4 days   How to contact the [facility] Attending or Consulting provider 7A - 7P or covering provider during after hours 7P -7A, for this patient?  1. Check the care team in [facility] and look for a) attending/consulting [facility] provider listed and b) the [facility] team listed 2. Log into [email or web address] and use [place]'s universal password to access. If you do not have the password, please contact the hospital operator. 3. Locate the [facility] provider you are looking for under [facility] and page to a number that you can be directly reached. 4. If you still have difficulty reaching the provider, please page the [facility] (Director on Call) for the Hospitalists listed on amion for assistance.

## 2019-03-25 NOTE — Evaluation (Addendum)
Clinical/Bedside Swallow Evaluation Patient Details  Name: Laurie Ross MRN: [id number] Date of Birth: 10-21-1940  Today's Date: 03/25/2019 Time: SLP Start Time (ACUTE ONLY): 0940 SLP Stop Time (ACUTE ONLY): 1040 SLP Time Calculation (min) (ACUTE ONLY): 60 min  Past Medical History:  Past Medical History:  Diagnosis Date  . Anemia   . Arthritis   . Asthma   . CHF (congestive heart failure) ([place])   . Chronic kidney disease    stage III  . COPD (chronic obstructive pulmonary disease) ([place])    occasional home O2 use  . Depression   . Diabetes mellitus without complication ([place])   . Dysrhythmia    bradycardia  . GERD (gastroesophageal reflux disease)   . Glaucoma   . Hx of Clostridium difficile infection March 2016  . Hyperlipidemia   . Hypertension   . Hypothyroidism   . Obstructive sleep apnea   . Sciatica   . Venous insufficiency    Past Surgical History:  Past Surgical History:  Procedure Laterality Date  . BREAST BIOPSY    . CATARACT EXTRACTION W/PHACO Right 03/23/2016   Procedure: CATARACT EXTRACTION PHACO AND INTRAOCULAR LENS PLACEMENT (IOC);  Surgeon: Leandrew Koyanagi, MD;  Location: [facility];  Service: Ophthalmology;  Laterality: Right;  DIABETIC - insulin and oral meds PLEASE LEAVE PT 2ND   HPI:  Pt is a 78 y.o. female with medical history significant of insulin-dependent diabetes mellitus type 2, hypothyroidism, obstructive sleep apnea, COPD, HTN, GERD, and other issues who presented to the hospital after being found on the floor.  Patient is a poor historian therefore most of the history provided by the daughter.  Apparently this morning patient was noted to be on the floor laying on her stomach.  When EMS was called she was noted to be hypoglycemic, amp of D50 insulin was given and brought to the hospital.  She took her routine dose of Lantus yesterday evening.  In the ER CT of the head was negative but she had 2 other episodes of hypoglycemia  requiring amp of D50 and eventually started on D5 fluids.  There was suspicion for left lower extremity erythema/cellulitis.  Due to elevated WBC, she was started on antibiotics.  Per daughter, patient lost one of her sisters last month and since then she has had very poor appetite.  Pt knew she had been found on the floor at home w/ low BS.  CXR at admit: pulmonary edema/acute diastolic CHF/pleural effusions; No New findings on CXR on 03/23/2019.  Cardiology is following.   Assessment / Plan / Recommendation Clinical Impression  Pt appeared to present w/ adequate oropharyngeal phase swallow function w/ No overt s/s of aspiration noted w/ oral intake when following general aspiration precautions. Pt is Edentulous which impacts mastication of solid foods but w/ breaking down and moistening the solids, she appeared to tolerate the food trials adequately taking her time to gum them. No overt s/s of aspiration were noted w/ trials; no wet vocal quality or coughing noted; no decline in respiratory effort during/post trials noted. Oral phase was min slower for bolus mashing/gumming w/ solid trials but timely A-P transfer noted w/ liquids and purees. Oral clearing achieved w/ all trials. OM exam appeared wfl w/ no unilateral weakness; speech clear during general conversation(pt min HOH). Pt fed self holding cup when drinking but noted she needed help w/ foods d/t min overall weakness, apparent edema in right hand. Recommend a dysphagia level 2 (MINCED w/ gravies) and thin liquids --  let pt Hold own Cup for Drinking for Safer Swallowing; general aspiration precautions; tray setup and support at meals as needed d/t overall weakness. Recommend Pills in Puree for safer swallowing d/t overall illness. NSG to reconsult ST services if any new concerns re: swallowing.  SLP Visit Diagnosis: Dysphagia, unspecified (R13.10)(Edentulous status impacting oral mastication)    Aspiration Risk  (reduced following general precs)     Diet Recommendation  Recommend a dysphagia level 2 (MINCED w/ gravies for easier mastication d/t Edentulous status Baseline); thin liquids. General aspiration precautions; tray setup and support at meals as needed d/t overall weakness. Recommend Pills in Puree for safer swallowing d/t overall illness.   Medication Administration: Whole meds with puree(for safer swallowing)    Other  Recommendations Recommended Consults: (Dietician f/u) Oral Care Recommendations: Oral care BID;Staff/trained caregiver to provide oral care Other Recommendations: (n/a)   Follow up Recommendations None      Frequency and Duration (n/a)  (n/a)       Prognosis Prognosis for Safe Diet Advancement: Good Barriers/Prognosis Comment: Edentulous status impacting mastication of solids      Swallow Study   General Date of Onset: 03/20/19 HPI: Pt is a 78 y.o. female with medical history significant of insulin-dependent diabetes mellitus type 2, hypothyroidism, obstructive sleep apnea, COPD, HTN, GERD, and other issues who presented to the hospital after being found on the floor.  Patient is a poor historian therefore most of the history provided by the daughter.  Apparently this morning patient was noted to be on the floor laying on her stomach.  When EMS was called she was noted to be hypoglycemic, amp of D50 insulin was given and brought to the hospital.  She took her routine dose of Lantus yesterday evening.  In the ER CT of the head was negative but she had 2 other episodes of hypoglycemia requiring amp of D50 and eventually started on D5 fluids.  There was suspicion for left lower extremity erythema/cellulitis.  Due to elevated WBC, she was started on antibiotics.  Per daughter, patient lost one of her sisters last month and since then she has had very poor appetite.  Pt knew she had been found on the floor at home w/ low BS.  CXR at admit: pulmonary edema/acute diastolic CHF/pleural effusions; No New findings on CXR  on 03/23/2019.  Cardiology is following. Type of Study: Bedside Swallow Evaluation Previous Swallow Assessment: none noted Diet Prior to this Study: NPO Temperature Spikes Noted: No(wbc 8.5) Respiratory Status: Nasal cannula(3 L) History of Recent Intubation: No Behavior/Cognition: Alert;Cooperative;Pleasant mood(min HOH) Oral Cavity Assessment: Dry Oral Care Completed by SLP: Yes Oral Cavity - Dentition: Edentulous(does not wear dentures) Vision: Functional for self-feeding Self-Feeding Abilities: Able to feed self;Needs assist;Needs set up Patient Positioning: Upright in bed(needed some support) Baseline Vocal Quality: Normal Volitional Cough: Strong Volitional Swallow: Able to elicit    Oral/Motor/Sensory Function Overall Oral Motor/Sensory Function: Within functional limits   Ice Chips Ice chips: Within functional limits Presentation: Spoon(fed; 3 trials)   Thin Liquid Thin Liquid: Within functional limits Presentation: Cup;Self Fed;Straw(~8 ozs total)    Nectar Thick Nectar Thick Liquid: Not tested   Honey Thick Honey Thick Liquid: Not tested   Puree Puree: Within functional limits Presentation: Spoon(fed; 8 trials)   Solid     Solid: Impaired(mech soft) Presentation: Spoon(fed; 5 trials) Oral Phase Impairments: Impaired mastication(edentulous ) Oral Phase Functional Implications: Impaired mastication(edentulous) Pharyngeal Phase Impairments: (none) Other Comments: minced and moistened the foods more  Jerilynn SomKatherine Watson, MS, CCC-SLP Watson,Katherine 03/25/2019,12:25 PM

## 2019-03-25 NOTE — Consult Note (Signed)
ANTICOAGULATION CONSULT NOTE -   Pharmacy Consult for Heparin Drip Indication: atrial fibrillation  Patient Measurements: Height: 5\' 1"  (154.9 cm) Weight: 182 lb 14.4 oz (83 kg) IBW/kg (Calculated) : 47.8 Heparin Dosing Weight: 66.1kg  Vital Signs: Temp: 98.4 F (36.9 C) (11/30 0500) Temp Source: Oral (11/30 0500) BP: 143/87 (11/30 0500) Pulse Rate: 107 (11/30 0500)  Labs: Recent Labs    03/23/19 0558  03/24/19 0622 03/24/19 0938 03/24/19 1742 03/25/19 0531  HGB 10.8*  --  11.4*  --   --  11.6*  HCT 33.5*  --  34.3*  --   --  35.6*  PLT 95*  --  114*  --   --  109*  HEPARINUNFRC  --    < >  --  0.37 0.30 0.37  CREATININE 0.98  --  0.94  --   --  0.99   < > = values in this interval not displayed.    Estimated Creatinine Clearance: 45 mL/min (by C-G formula based on SCr of 0.99 mg/dL).   Medical History: Past Medical History:  Diagnosis Date  . Anemia   . Arthritis   . Asthma   . CHF (congestive heart failure) ([place])   . Chronic kidney disease    stage III  . COPD (chronic obstructive pulmonary disease) ([place])    occasional home O2 use  . Depression   . Diabetes mellitus without complication ([place])   . Dysrhythmia    bradycardia  . GERD (gastroesophageal reflux disease)   . Glaucoma   . Hx of Clostridium difficile infection March 2016  . Hyperlipidemia   . Hypertension   . Hypothyroidism   . Obstructive sleep apnea   . Sciatica   . Venous insufficiency     Medications:  No PTA anticoagulation of note  Assessment: Laurie Ross is a 78 y.o. female with a hx of diabetes, anemia, chronic kidney disease, COPD, hypertension, hyperlipidemia presenting with hypoglycemia, pharmacy consulted for heparin dosing for atrial fibrillation. The heparin infusion was d/c on 11/27. The last heparin level was 0.81 on 11/27 at 0853 and the infusion rate was reduced to 750 units/hr from 850 units/hr. Thrombocytopenia is noted to be chronic dating back to at least June  2016 with multiple data points.  It appears to be stable.   restart heparin infusion at 750 units/hr: d/w Dr Sarajane Jews and bolus is being withheld due to thrombocytopenia  11/28 @ 2155 HL = 0.20, subtherapeutic, will increase rate to 850 units/hr 11/29 @ 0938 HL = 0.37, therapeutic, will continue rate of 850 units/hr   Goal of Therapy:  Heparin level 0.3-0.7 units/ml Monitor platelets by anticoagulation protocol: Yes   Plan:   11/29@1742 : HL 0.30, therapeutic but trending down. Will increase rate to 900 units/hr and recheck level with AM labs as consecutive levels have been within desired limit.  11/30 @ 0531 HL 0.37, therapeutic x 2.  Continue current rate, recheck HL in am  Ena Dawley, PharmD Clinical Pharmacist 03/25/2019 7:12 AM

## 2019-03-26 LAB — BASIC METABOLIC PANEL
Anion gap: 10 (ref 5–15)
BUN: 25 mg/dL — ABNORMAL HIGH (ref 8–23)
CO2: 35 mmol/L — ABNORMAL HIGH (ref 22–32)
Calcium: 8.5 mg/dL — ABNORMAL LOW (ref 8.9–10.3)
Chloride: 98 mmol/L (ref 98–111)
Creatinine, Ser: 1.09 mg/dL — ABNORMAL HIGH (ref 0.44–1.00)
GFR calc Af Amer: 56 mL/min — ABNORMAL LOW (ref >60)
GFR calc non Af Amer: 48 mL/min — ABNORMAL LOW (ref >60)
Glucose, Bld: 165 mg/dL — ABNORMAL HIGH (ref 70–99)
Potassium: 3.9 mmol/L (ref 3.5–5.1)
Sodium: 143 mmol/L (ref 135–145)

## 2019-03-26 LAB — CBC
HCT: 35.9 % — ABNORMAL LOW (ref 36.0–46.0)
Hemoglobin: 11.4 g/dL — ABNORMAL LOW (ref 12.0–15.0)
MCH: 32.1 pg (ref 26.0–34.0)
MCHC: 31.8 g/dL (ref 30.0–36.0)
MCV: 101.1 fL — ABNORMAL HIGH (ref 80.0–100.0)
Platelets: 89 10*3/uL — ABNORMAL LOW (ref 150–400)
RBC: 3.55 MIL/uL — ABNORMAL LOW (ref 3.87–5.11)
RDW: 14.6 % (ref 11.5–15.5)
WBC: 8.5 10*3/uL (ref 4.0–10.5)
nRBC: 0 % (ref 0.0–0.2)

## 2019-03-26 LAB — BODY FLUID CULTURE: Culture: NO GROWTH

## 2019-03-26 LAB — CYTOLOGY - NON PAP

## 2019-03-26 LAB — GLUCOSE, CAPILLARY
Glucose-Capillary: 187 mg/dL — ABNORMAL HIGH (ref 70–99)
Glucose-Capillary: 209 mg/dL — ABNORMAL HIGH (ref 70–99)

## 2019-03-26 MED ORDER — FUROSEMIDE 10 MG/ML IJ SOLN
INTRAMUSCULAR | Status: AC
  Filled 2019-03-26: qty 4

## 2019-03-26 MED ORDER — FUROSEMIDE 40 MG PO TABS: 40.0000 mg | ORAL_TABLET | Freq: Every day | ORAL | Status: DC

## 2019-03-26 MED ORDER — INSULIN GLARGINE 100 UNIT/ML ~~LOC~~ SOLN: 5.0000 [IU] | Freq: Every day | SUBCUTANEOUS | Status: AC

## 2019-03-26 MED ORDER — FUROSEMIDE 40 MG PO TABS: 40.0000 mg | ORAL_TABLET | Freq: Every day | ORAL | 0 refills | Status: AC

## 2019-03-26 MED ORDER — METOPROLOL TARTRATE 25 MG PO TABS: 25.0000 mg | ORAL_TABLET | Freq: Two times a day (BID) | ORAL | 0 refills | Status: AC

## 2019-03-26 MED ORDER — APIXABAN 5 MG PO TABS: 5.0000 mg | ORAL_TABLET | Freq: Two times a day (BID) | ORAL | 0 refills | Status: AC

## 2019-03-26 NOTE — TOC Transition Note (Signed)
Transition of Care [facility]) - CM/SW Discharge Note   Patient Details  Name: Laurie Ross MRN: [id number] Date of Birth: 1940-12-08  Transition of Care [facility]) CM/SW Contact:  [name], LCSW Phone Number: 03/26/2019, 6:29 PM   Clinical Narrative:    Patient to be d/c'ed today to [facility] program.  Patient and family agreeable to plans will transport via [name], MD spoke to [name] PCP for patient with updates.  Patient's family made aware that [name] will pick patient up for discharge today.     Final next level of care: [place] Barriers to Discharge: Barriers Resolved   Patient Goals and CMS Choice Patient states their goals for this hospitalization and ongoing recovery are:: To return back home with [facility] program CMS Medicare.gov Compare Post Acute Care list provided to:: Patient Represenative (must comment) Choice offered to / list presented to : Adult Children  Discharge Placement                       Discharge Plan and Services   Discharge Planning Services: CM Consult            DME Arranged: N/A DME Agency: NA       HH Arranged: (Patient using [facility] program) [place] Agency: Other - See comment([facility] program) Date HH Agency Contacted: 03/26/19 Time HH Agency Contacted: 1200 Representative spoke with at [place]: Dr. Lavone Neri  Social Determinants of Health ([place]) Interventions     Readmission Risk Interventions Readmission Risk Prevention Plan 03/22/2019 10/23/2018  Transportation Screening Complete Complete  PCP or Specialist Appt within 3-5 Days - Complete  HRI or [place] - Complete  Social Work Consult for [place] Planning/Counseling - Complete  Palliative Care Screening - Not Applicable  Medication Review Press photographer) Complete Complete  Some recent data might be hidden

## 2019-03-26 NOTE — Progress Notes (Signed)
Progress Note  Patient Name: Laurie Ross Date of Encounter: 03/25/2019  Primary Cardiologist: [facility], Dr. Mariah MillingGollan  Subjective   Patient denies CP, palpitations, or racing HR. She feels as if she is breathing well today on her usual 2L [place] oxygen. She does not feel that she is holding on to any volume, but she does state "[her] legs feel heavy."  Inpatient Medications    Scheduled Meds: . aspirin EC  81 mg Oral Daily  . budesonide (PULMICORT) nebulizer solution  0.25 mg Nebulization Q12H  . cephALEXin  500 mg Oral Q6H  . Chlorhexidine Gluconate Cloth  6 each Topical Daily  . diltiazem  360 mg Oral Daily  . docusate sodium  100 mg Oral Daily  . feeding supplement (NEPRO CARB STEADY)  237 mL Oral BID BM  . ferrous sulfate  325 mg Oral Q M,W,F  . insulin aspart  0-9 Units Subcutaneous TID WC  . insulin glargine  5 Units Subcutaneous Daily  . ipratropium-albuterol  3 mL Nebulization TID  . levothyroxine  50 mcg Oral Daily  . mouth rinse  15 mL Mouth Rinse BID  . multivitamin with minerals  1 tablet Oral Daily  . mupirocin ointment  1 application Nasal BID  . pantoprazole  40 mg Oral Daily  . potassium chloride  20 mEq Oral BID  . sertraline  150 mg Oral QHS  . traZODone  100 mg Oral QHS  . valACYclovir  500 mg Oral Daily   Continuous Infusions: . heparin 900 Units/hr (03/24/19 2104)   PRN Meds: acetaminophen **OR** acetaminophen, calcium carbonate, loperamide, morphine injection, senna-docusate   Vital Signs    Vitals:   03/24/19 2001 03/24/19 2008 03/25/19 0500 03/25/19 0725  BP: 136/70  (!) 143/87 (!) 131/51  Pulse: (!) 110  (!) 107 (!) 105  Resp: 18  20 20   Temp: 98.1 F (36.7 C)  98.4 F (36.9 C) 98.5 F (36.9 C)  TempSrc: Oral  Oral Oral  SpO2: 94% 94% 98% 98%  Weight:   83 kg   Height:        Intake/Output Summary (Last 24 hours) at 03/25/2019 1215 Last data filed at 03/24/2019 2316 Gross per 24 hour  Intake 108.36 ml  Output 1200 ml  Net  -1091.64 ml   Last 3 Weights 03/25/2019 03/24/2019 03/23/2019  Weight (lbs) 182 lb 14.4 oz 184 lb 11.2 oz 192 lb 0.3 oz  Weight (kg) 82.963 kg 83.779 kg 87.1 kg      Telemetry    Atrial fibrillation with rare runs up to 140, rates typically in 80s- Personally Reviewed  ECG    No new tracings - Personally Reviewed  Physical Exam   GEN: No acute distress.  Lying in bed Neck: JVD difficult to assess due to body habitus Cardiac: IRIR, 2/6 systolic murmur best heard LUSB, no rubs, or gallops.  Respiratory: Poor inspiratory effort, reduced bibasilar breath sounds, auscultation performed anteriorly at the bases. GI: Soft, nontender, non-distended  MS: No edema; No deformity. Neuro: Nonfocal Psych: Normal affect   Labs    High Sensitivity Troponin:   Recent Labs  Lab 03/21/19 1523 03/21/19 1736  TROPONINIHS 22* 25*      Cardiac EnzymesNo results for input(s): TROPONINI in the last 168 hours. No results for input(s): TROPIPOC in the last 168 hours.   Chemistry Recent Labs  Lab 03/20/19 0711 03/21/19 0549  03/23/19 0558 03/24/19 0622 03/25/19 0531  NA 142 142   < > 144  144 148*  K 3.2* 3.8   < > 3.5 3.4* 4.0  CL 105 106   < > 106 101 103  CO2 29 29   < > 28 31 32  GLUCOSE 70 248*   < > 125* 154* 102*  BUN 28* 22   < > 24* 22 24*  CREATININE 1.15* 1.05*   < > 0.98 0.94 0.99  CALCIUM 8.8* 8.3*   < > 8.5* 8.5* 8.1*  PROT 6.4* 5.4*  --  5.6*  --   --   ALBUMIN 3.0* 2.5*  --  2.6*  --   --   AST 19 16  --  15  --   --   ALT 17 16  --  16  --   --   ALKPHOS 79 64  --  59  --   --   BILITOT 0.8 0.6  --  0.6  --   --   GFRNONAA 45* 50*   < > 55* 58* 54*  GFRAA 52* 58*   < > >60 >60 >60  ANIONGAP 8 7   < > 10 12 13    < > = values in this interval not displayed.     Hematology Recent Labs  Lab 03/23/19 0558 03/24/19 0622 03/25/19 0531  WBC 7.9 9.6 8.8  RBC 3.29* 3.53* 3.62*  HGB 10.8* 11.4* 11.6*  HCT 33.5* 34.3* 35.6*  MCV 101.8* 97.2 98.3  MCH 32.8 32.3 32.0   MCHC 32.2 33.2 32.6  RDW 14.3 14.5 14.7  PLT 95* 114* 109*    BNP Recent Labs  Lab 03/20/19 1638 03/22/19 0442  BNP 453.0* 617.0*     DDimer No results for input(s): DDIMER in the last 168 hours.   Radiology    No results found.  Cardiac Studies   Echo 03/21/19  1. Left ventricular ejection fraction, by visual estimation, is 55 to 60%. The left ventricle has normal function. There is no left ventricular hypertrophy.  2. Left ventricular diastolic parameters are indeterminate.  3. Global right ventricle has normal systolic function.The right ventricular size is normal. No increase in right ventricular wall thickness.  4. Left atrial size was mild-moderately dilated.  5. Tricuspid valve regurgitation mild-moderate.  6. The aortic valve was not well visualized. Moderately calcified. No valve gradient measurements obtained (consider repeat study at later date for evaluation). Aortic valve regurgitation is not visualized.  7. Severely elevated pulmonary artery systolic pressure.  8. Left pleural effusion noted, 8 cm  9. The tricuspid regurgitant velocity is 4.13 m/s, and with an assumed right atrial pressure of 10 mmHg, the estimated right ventricular systolic pressure is severely elevated at 78.2 mmHg. 10. Rhythm is atrial fibrillation.  Patient Profile     78 y.o. female with history of IDDM, CKDII, anemia, thrombocytopenia, COPD  HTN, HLD, hypothyroidism, and OSA who is being seen for new onset Afib with RVR of uncertain   Assessment & Plan    New onset Afib with RVR --Remains in Afib and asymptomatic. Unknown date/time of new onset Afib. --Rates better controlled today and since starting on low dose metoprolol 25mg  BID. Continue with Cardizem 360mg  daily at discharge.   --CHA2DS2VASc score of at least  6 (CHF, HTN, age x2, DM2, female). Transitioned to Elqiuis 5mg  BID yesterday, which should be continued at discharge. --No plan for cardioversion this admission as she is  not therapeutically anticoagulated and relatively asymptomatic with rates now better controlled. Cardioversion, if needed, can be reconsidered after  3-4 weeks of anticoagulation. --No further management needed at this time and can likely sign off today. Recommend follow-up and management of Afib with [facility] Cardiologist at discharge.   Acute on chronic diastolic heart failure, Pulmonary HTN Bilateral pleural effusions s/p R sided thoracentesis --No SOB.  --Echo LVEF normal. Severe pulmonary HTN.  --S/p R sided thoracentesis with 950cc removed.  --Euvolemic on exam. --Transitioned from IV lasix 40mg  BID to oral lasix 40mg  daily. Continue to monitor I/O, daily weights. Wt 202lbs (11/27)  184.75lbs (11/30). Net -3.6L for the admission.-1L over last 24 hours. Daily BMET. Replete electrolytes with K goal 4.0 and Mg goal 2.0. --Follow-up after discharge with [facility] Cardiologist.  Tricuspid regurgitation, mild to moderate --Continue to monitor with periodic outpatient echo. Could contribute to SOB. Most recent echo as above.  For questions or updates, please contact [place] Please consult [email or web address] for contact info under        Signed, Arvil Chaco, PA-C  03/26/19  8:54 AM

## 2019-03-26 NOTE — Progress Notes (Signed)
Physical Therapy Treatment Patient Details Name: Laurie Ross MRN: [id number] DOB: 1940/05/11 Today's Date: 03/26/2019    History of Present Illness Laurie Ross is a 78 y.o. female with medical history significant of insulin-dependent diabetes mellitus type 2, hypothyroidism, obstructive sleep apnea, COPD, HTN presented to the hospital ED by EMS 03/20/2019 after being found on the floor. She was admitted to the hospital with hypoglycemia, atrial fibrillation with RVR, pulmonary edema/acute diastolic CHF/pleural effusions.    PT Comments    Pt to edge of bed with mod a x 1 and was able to remain sitting x 10 minutes unsupported while awaiting +2 assist for mobility.  Stood with mod a x 2 to walker.  After seated rest, she was able to stand again and transfer to recliner at bedside.    Original recommendations for SNF. Per chart review, pt is a participant in the [facility] program and has good family support at home.  Given improved mobility this session and support system at home, will adjust discharge recommendations to reflect.   Follow Up Recommendations  Home health PT;Supervision/Assistance - 24 hour;Supervision for mobility/OOB     Equipment Recommendations  None recommended by PT    Recommendations for Other Services       Precautions / Restrictions Precautions Precautions: Fall Restrictions Weight Bearing Restrictions: No    Mobility  Bed Mobility Overal bed mobility: Needs Assistance Bed Mobility: Supine to Sit Rolling: Mod assist            Transfers Overall transfer level: Needs assistance Equipment used: Rolling walker (2 wheeled) Transfers: Sit to/from Stand Sit to Stand: Min assist;+2 physical assistance;Mod assist            Ambulation/Gait Ambulation/Gait assistance: Min assist;+2 physical assistance Gait Distance (Feet): 3 Feet Assistive device: Rolling walker (2 wheeled) Gait Pattern/deviations: Step-to pattern;Decreased step length -  right;Decreased step length - left;Trunk flexed Gait velocity: decreased   General Gait Details: able to transfer to chair well with +2 assist   Stairs             Wheelchair Mobility    Modified Rankin (Stroke Patients Only)       Balance Overall balance assessment: Needs assistance Sitting-balance support: Bilateral upper extremity supported;Feet unsupported Sitting balance-Leahy Scale: Fair     Standing balance support: Bilateral upper extremity supported Standing balance-Leahy Scale: Poor Standing balance comment: heavy reliance on walker                            Cognition Arousal/Alertness: Awake/alert Behavior During Therapy: WFL for tasks assessed/performed Overall Cognitive Status: Within Functional Limits for tasks assessed                                        Exercises Other Exercises Other Exercises: sitting EOB x 10 minutes without LOB with some LAQ with encouragement    General Comments        Pertinent Vitals/Pain Pain Assessment: Faces Faces Pain Scale: Hurts a little bit Pain Location: LE soreness with mobility Pain Descriptors / Indicators: Sore Pain Intervention(s): Limited activity within patient's tolerance;Monitored during session    Home Living                      Prior Function            PT Goals (  current goals can now be found in the care plan section) Progress towards PT goals: Progressing toward goals    Frequency    Min 2X/week      PT Plan Discharge plan needs to be updated    Co-evaluation              AM-PAC PT "6 Clicks" Mobility   Outcome Measure  Help needed turning from your back to your side while in a flat bed without using bedrails?: A Little Help needed moving from lying on your back to sitting on the side of a flat bed without using bedrails?: A Lot Help needed moving to and from a bed to a chair (including a wheelchair)?: A Lot Help needed standing  up from a chair using your arms (e.g., wheelchair or bedside chair)?: A Lot Help needed to walk in hospital room?: A Lot Help needed climbing 3-5 steps with a railing? : Total 6 Click Score: 12    End of Session Equipment Utilized During Treatment: Oxygen;Gait belt Activity Tolerance: Patient tolerated treatment well Patient left: in chair;with call bell/phone within reach;with chair alarm set Nurse Communication: Mobility status       Time: 8315-1761 PT Time Calculation (min) (ACUTE ONLY): 18 min  Charges:  $Therapeutic Activity: 8-22 mins                    Danielle Dess, PTA 03/26/19, 12:10 PM

## 2019-03-26 NOTE — Discharge Summary (Addendum)
Physician Discharge Summary  Laurie Ross AYT:[id number] DOB: 1941-03-25 DOA: 03/20/2019  PCP: [facility] date: 03/20/2019 Discharge date: 03/26/2019  Recommendations for Outpatient Follow-up:  I discussed discharge with her primary care physician, she will be seen in the office by Dr. Lavone Neri today.  See individual issues below. --Home health PT recommended --BiPAP at night recommended.  Recommend supplemental oxygen during the day. --Started on apixaban, Lasix dose adjusted, metoprolol added, insulin adjusted --Monitor thrombocytopenia while on anticoagulation  Follow-up Information    [facility] Follow up on 04/04/2019.   Specialty: Cardiology Why: at 1:00pm. Enter through the [place] entrance Contact information: [address] [place] [phone]       [facility] Follow up.   Why: Will see today Contact information: [place] [address] [phone]            Discharge Diagnoses: Principal diagnosis is #1 1. Acute on chronic hypoxic respiratory failure, acute hypercapnic respiratory failure with respiratory acidosis 2. Atrial fibrillation with rapid ventricular response 3. Acute on chronic diastolic CHF 4. Dysphagia 5. COPD, chronic hypoxic respiratory failure on 2 L nasal cannula at home, severe pulmonary hypertension 6. Left lower extremity cellulitis present on admission, sepsis considered on admission, now ruled out 7. Demand ischemia secondary to atrial fibrillation with RVR and volume overload. 8. topenia 9. 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 Hypoglycemia 11. Syncope secondary to hypoglycemia 12. Hypothyroidism 13. CKD stage IIIa 14. Depression 15. Dementia   Discharge Condition: improved Disposition: home/PACE  Diet recommendation:   Dysphagia level 2 (MINCED w/ graviesfor easier  mastication d/t Edentulous status Baseline);thin liquids. General aspiration precautions; tray setup and support at meals as needed d/t overall weakness. Recommend Pills in Puree for safer swallowing d/t overall illness.  Filed Weights   03/24/19 0258 03/25/19 0500 03/26/19 0528  Weight: 83.8 kg 83 kg 83.8 kg    History of present illness:  78 year old woman lives at home with her daughters, PMH COPD on 2 L nasal cannula, diabetes mellitus type 2, hypothyroidism, OSA presented to the hospital after being found on the floor at home.  Found laying on stomach.  Noted to be hypoglycemic by EMS and given D50.  Took routine dose of Lantus evening prior to presentation.  Patient lost one of her sisters last month and has had a poor appetite.  In the emergency department had 2 episodes of hypoglycemia.  Admitted for further evaluation of hypoglycemia.   Hospital Course:   Hypoglycemia resolved but she developed atrial fibrillation with rapid ventricular response and massive volume overload secondary to acute CHF.  Respiratory status decompensated she was transferred to the ICU was placed on BiPAP.  Fortunately she began to diurese, was weaned off BiPAP during the day and transferred back to the medical floor.    Medications were adjusted and rate was brought under control with assistance of cardiology.  She was started on anticoagulation.  Assessed by therapy with recommendation for PT home health.  Individual issues as below.  I discussed the hospitalization with her primary care physician today by telephone, we discussed the changes to her medical regimen, the addition of BiPAP and the PT recommendations.  All this will be arranged by Dr. Lavone Neri.  I updated daughter by telephone.  Acute on chronic hypoxic respiratory failure, acute hypercapnic respiratory failure with respiratory acidosis.  On 2 L at home.  SARS-CoV-2  negative.  Multifactorial including acute CHF, severe pulmonary hypertension, COPD.   Seen by pulmonology, care appreciated. --Acute issues resolved at this point.  Continue BiPAP at night, supplemental oxygen during the day.  Atrial fibrillation with rapid ventricular response. CHA2DS2-VASc 6. Atrial fibrillation diagnosed by PCP approximately 10 days prior to presentation.  Treated with diltiazem infusion, IV heparin. --Rate appears controlled now.  Continue diltiazem 360 mg and metoprolol as added by cardiology.  Discharged on apixaban.  Acute on chronic diastolic CHF.  Thought to be secondary to rapid heart rate. --Appears essentially euvolemic at this point.  Discharged on Lasix 40 mg daily.    Dysphagia --Dysphagia level 2 (MINCED w/ graviesfor easier mastication d/t Edentulous status Baseline);thin liquids. General aspiration precautions; tray setup and support at meals as needed d/t overall weakness. Recommend Pills in Puree for safer swallowing d/t overall illness.  COPD, chronic hypoxic respiratory failure on 2 L nasal cannula at home, severe pulmonary hypertension --Resolved.  Continue bronchodilators and BiPAP on discharge.  Left lower extremity cellulitis present on admission, sepsis considered on admission, now ruled out. Bilateral lower extremity Dopplers negative for DVT. --resolved.  No further antibiotics indicated.  Demand ischemia secondary to atrial fibrillation with RVR and volume overload. --Asymptomatic   --Follow-up with Dr. Mariah Milling or APP in 2 weeks.  Outpatient evaluation recommended to consider noninvasive ischemic evaluation.  Thrombocytopenia  --Chronic dating back to at least June 2016 with multiple data points.  --Stable.  Follow-up as an outpatient.  Monitor while on anticoagulation.  I discussed with Dr. Providence Lanius today.  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  Hemoglobin A1c 6.0 11/25. --CBG remains  stable.  No hypoglycemic episodes. --Continue Lantus 5 units daily and follow closely for recurrent hypoglycemia. --Continue  sliding scale insulin. --Given hemoglobin A1c, may need large reduction of Lantus on discharge.  Hypoglycemia.  Secondary to ongoing use of insulin at home complicated by poor oral intake in the context of recent loss of her sister. --Hypoglycemia resolved.  Secondary to poor oral intake in the context of chronic insulin.  Syncope secondary to hypoglycemia.  CT head negative.  Treated with D5 half-normal saline infusion. --Echocardiogram and CT head unremarkable.  No further evaluation suggested.  Hypothyroidism.  TSH within normal limits. --Continue levothyroxine.  CKD stage IIIa --Remained stable.  Depression --Continue Zoloft   Dementia.  Followed by PACE. --Stable.  Significant Hospital Events    11/25 admitted for hypoglycemia, syncope, left lower extremity cellulitis  11/26 developed atrial fibrillation with rapid ventricular response  11/26 cardiology consultation  11/27 Foley catheter insertion  11/27 worsening respiratory failure, transferred to ICU, started on BiPAP  Consults:   Cardiology  Pulmonology  Procedures:   Echocardiogram LVEF 55 to 60%.  Indeterminate diastolic parameters left ventricle.  Severely elevated pulmonary artery systolic pressure.  11/27 large-volume thoracentesis 950 mL removed right chest Significant Diagnostic Tests:   CT head no acute changes  Lower extremity venous Dopplers negative for DVT.  Chest x-ray bilateral opacities and small pleural effusions.  To my eye there may be some slight improvement 11/27 compared to admission study 11/25  Micro Data:   SARS Covid negative  Blood cultures pending  Today's assessment: S: "I want to go home today."  Feels well.  Breathing fine.  No complaints. O: Vitals:  Vitals:   03/26/19 0528 03/26/19 0733  BP: 136/77 129/68  Pulse: 99 74  Resp: 20 18  Temp: 98 F (36.7 C) (!) 97.4 F (36.3 C)  SpO2: 96%  94%    Constitutional.  Appears calm,  comfortable. Respiratory.  Clear to auscultation bilaterally.  No wheezes, rales or rhonchi.  Normal respiratory effort. Cardiovascular.  Irregular, normal rate, no murmur, rub or gallop.  No significant lower extremity edema.  Upper extremity edema appears nearly resolved Abdomen soft Psychiatric grossly normal mood and affect.  Speech fluent and appropriate.  BMP unremarkable Hgb stable Plts stable  Discharge Instructions  Discharge Instructions    Increase activity slowly   Complete by: As directed      Allergies as of 03/26/2019      Reactions   Penicillins Itching   Has patient had a PCN reaction causing immediate rash, facial/tongue/throat swelling, SOB or lightheadedness with hypotension: No Has patient had a PCN reaction causing severe rash involving mucus membranes or skin necrosis: No Has patient had a PCN reaction that required hospitalization No Has patient had a PCN reaction occurring within the last 10 years: No If all of the above answers are "NO", then may proceed with Cephalosporin use.   Codeine Other (See Comments)   Unknown reaction   Glucophage [metformin Hcl] Other (See Comments)   Unknown reaction   Loratadine Other (See Comments)   Unknown reaction      Medication List    STOP taking these medications   torsemide 10 MG tablet Commonly known as: DEMADEX     TAKE these medications   acetaminophen 650 MG CR tablet Commonly known as: TYLENOL Take 1,300 mg by mouth 2 (two) times a day.   albuterol (2.5 MG/3ML) 0.083% nebulizer solution Commonly known as: PROVENTIL Take 2.5 mg by nebulization every 4 (four) hours as needed for wheezing or shortness of breath.   apixaban 5 MG Tabs tablet Commonly known as: ELIQUIS Take 1 tablet (5 mg total) by mouth 2 (two) times daily.   aspirin EC 81 MG tablet Take 81 mg by mouth daily.   brimonidine 0.2 % ophthalmic solution Commonly known as: ALPHAGAN Place 1 drop into the right eye 3 (three) times  daily.   budesonide 0.25 MG/2ML nebulizer solution Commonly known as: PULMICORT Take 0.25 mg by nebulization 2 (two) times daily.   Calcium Carb-Cholecalciferol 600-400 MG-UNIT Tabs Take 1 tablet by mouth 2 (two) times a day.   calcium carbonate 500 MG chewable tablet Commonly known as: TUMS - dosed in mg elemental calcium Chew 1 tablet by mouth every 6 (six) hours as needed for indigestion or heartburn.   cetirizine 5 MG tablet Commonly known as: ZYRTEC Take 5 mg by mouth at bedtime.   diltiazem 360 MG 24 hr capsule Commonly known as: CARDIZEM CD Take 360 mg by mouth daily.   docusate sodium 100 MG capsule Commonly known as: COLACE Take 100 mg by mouth daily.   dorzolamide-timolol 22.3-6.8 MG/ML ophthalmic solution Commonly known as: COSOPT Place 1 drop into both eyes 2 (two) times daily.   ferrous sulfate 325 (65 FE) MG tablet Take 325 mg by mouth every Monday, Wednesday, and Friday.   fluticasone 50 MCG/ACT nasal spray Commonly known as: FLONASE Place 2 sprays into both nostrils daily.   furosemide 40 MG tablet Commonly known as: LASIX Take 1 tablet (40 mg total) by mouth daily. What changed:   medication strength  how much to take   glucose 4 GM chewable tablet Chew 1 tablet by mouth as needed for low blood sugar.   insulin glargine 100 UNIT/ML injection Commonly known as: LANTUS Inject 0.05 mLs (5 Units total) into the skin at  bedtime. What changed: how much to take   latanoprost 0.005 % ophthalmic solution Commonly known as: XALATAN Place 1 drop into both eyes at bedtime.   levothyroxine 50 MCG tablet Commonly known as: SYNTHROID Take 50 mcg by mouth daily.   loperamide 2 MG capsule Commonly known as: IMODIUM Take 2 mg by mouth as needed for diarrhea or loose stools (max 4 capsules in 24 hours).   metoprolol tartrate 25 MG tablet Commonly known as: LOPRESSOR Take 1 tablet (25 mg total) by mouth 2 (two) times daily.   omeprazole 20 MG  capsule Commonly known as: PRILOSEC Take 20 mg by mouth daily.   ondansetron 4 MG tablet Commonly known as: ZOFRAN Take 4 mg by mouth every 8 (eight) hours as needed for nausea or vomiting.   prednisoLONE acetate 1 % ophthalmic suspension Commonly known as: PRED FORTE Place 1 drop into the right eye 2 (two) times a day.   sertraline 100 MG tablet Commonly known as: ZOLOFT Take 100 mg by mouth at bedtime.   sertraline 50 MG tablet Commonly known as: ZOLOFT Take 50 mg by mouth at bedtime.   traZODone 100 MG tablet Commonly known as: DESYREL Take 100 mg by mouth at bedtime.   valACYclovir 500 MG tablet Commonly known as: VALTREX Take 500 mg by mouth daily.   vitamin A & D ointment Apply 1 application topically 4 (four) times daily as needed for dry skin.   Zinc Oxide 13 % Crea Apply 1 application topically 3 (three) times daily. After toileting      Allergies  Allergen Reactions   Penicillins Itching    Has patient had a PCN reaction causing immediate rash, facial/tongue/throat swelling, SOB or lightheadedness with hypotension: No Has patient had a PCN reaction causing severe rash involving mucus membranes or skin necrosis: No Has patient had a PCN reaction that required hospitalization No Has patient had a PCN reaction occurring within the last 10 years: No If all of the above answers are "NO", then may proceed with Cephalosporin use.    Codeine Other (See Comments)    Unknown reaction   Glucophage [Metformin Hcl] Other (See Comments)    Unknown reaction   Loratadine Other (See Comments)    Unknown reaction    The results of significant diagnostics from this hospitalization (including imaging, microbiology, ancillary and laboratory) are listed below for reference.    Significant Diagnostic Studies: Dg Chest 1 View  Result Date: 03/22/2019 CLINICAL DATA:  Shortness of breath EXAM: CHEST  1 VIEW COMPARISON:  03/20/2019 FINDINGS: Shallow lung inflation with  unchanged multifocal bilateral airspace opacities. Cardiomediastinal contours are unchanged. Small pleural effusions. IMPRESSION: Unchanged examination with bilateral opacities and small pleural effusions. Electronically Signed   By: Deatra [place] M.D.   On: 03/22/2019 03:17   Ct Head Wo Contrast  Result Date: 03/20/2019 CLINICAL DATA:  Unwitnessed fall. EXAM: CT HEAD WITHOUT CONTRAST TECHNIQUE: Contiguous axial images were obtained from the base of the skull through the vertex without intravenous contrast. COMPARISON:  October 21, 2018. FINDINGS: Brain: Mild chronic ischemic white matter disease is noted. No mass effect or midline shift is noted. Ventricular size is within normal limits. There is no evidence of mass lesion, hemorrhage or acute infarction. Vascular: No hyperdense vessel or unexpected calcification. Skull: Normal. Negative for fracture or focal lesion. Sinuses/Orbits: No acute finding. Other: None. IMPRESSION: Mild chronic ischemic white matter disease. No acute intracranial abnormality seen. Electronically Signed   By: Zenda Alpers.D.  On: 03/20/2019 09:45   [place] Renal  Result Date: 03/22/2019 CLINICAL DATA:  Oliguria. EXAM: RENAL / URINARY TRACT ULTRASOUND COMPLETE COMPARISON:  None. FINDINGS: Right Kidney: Renal measurements: 10.4 x 5.2 x 5.1 cm = volume: 143 mL . Echogenicity within normal limits. No mass or hydronephrosis visualized. Left Kidney: Renal measurements: 10.1 x 4.8 x 4.0 cm = volume: 101 mL. Echogenicity within normal limits. No mass or hydronephrosis visualized. Bladder: Decompressed secondary to Foley catheter. Other: None. IMPRESSION: Normal renal ultrasound. Electronically Signed   By: Lupita RaiderJames  Green Jr M.D.   On: 03/22/2019 17:45   Lower Extremity Dopplers-[facility]  Result Date: 03/20/2019 CLINICAL DATA:  Edema and pain. EXAM: BILATERAL LOWER EXTREMITY VENOUS DOPPLER ULTRASOUND TECHNIQUE: Gray-scale sonography with compression, as well as color and duplex ultrasound,  were performed to evaluate the deep venous system from the level of the common femoral vein through the popliteal and proximal calf veins. Technologist describes technically limited exam due to swelling and body habitus. COMPARISON:  None FINDINGS: Normal compressibility of the common femoral, superficial femoral, and popliteal veins, as well as the proximal calf veins. No filling defects to suggest DVT on grayscale or color Doppler imaging. Doppler waveforms show normal direction of venous flow, normal respiratory phasicity and response to augmentation. Bilateral calf subcutaneous edema, right greater than left. IMPRESSION: No femoropopliteal and no calf DVT in the visualized calf veins. If clinical symptoms are inconsistent or if there are persistent or worsening symptoms, further imaging (possibly involving the iliac veins) may be warranted. Electronically Signed   By: Corlis Leak  Hassell M.D.   On: 03/20/2019 14:00   Dg Chest Port 1 View  Result Date: 03/23/2019 CLINICAL DATA:  Acute respiratory failure EXAM: PORTABLE CHEST 1 VIEW COMPARISON:  Chest x-rays dated 03/22/2019 FINDINGS: Heart size and mediastinal contours are stable. Stable opacity at the LEFT lung base, likely atelectasis and/or small pleural effusion. Probable mild atelectasis and/or small pleural effusion at the RIGHT lung base. No pneumothorax seen. No new lung findings. IMPRESSION: No new lung findings. Probable mild bibasilar atelectasis and/or small pleural effusions. Electronically Signed   By: Bary RichardStan  Maynard M.D.   On: 03/23/2019 07:40   Dg Chest Port 1 View  Result Date: 03/22/2019 CLINICAL DATA:  Status post thoracentesis EXAM: PORTABLE CHEST 1 VIEW COMPARISON:  03/22/2019, 2:57 a.m. FINDINGS: Status post right thoracentesis with near complete resolution of a small right pleural effusion. No significant pneumothorax. Unchanged small left pleural effusion. Unchanged diffuse interstitial opacity, likely edema. Cardiomegaly. IMPRESSION:  Status post right thoracentesis with near complete resolution of a small right pleural effusion. No significant pneumothorax. Electronically Signed   By: Lauralyn PrimesAlex  Bibbey M.D.   On: 03/22/2019 16:34   Dg Chest Port 1 View  Result Date: 03/20/2019 CLINICAL DATA:  Fall EXAM: PORTABLE CHEST 1 VIEW COMPARISON:  October 21, 2018 FINDINGS: Small bilateral pleural effusions and bibasilar atelectasis/consolidation. Pulmonary vascular congestion and fissural fluid. Stable cardiomediastinal contours. IMPRESSION: Pulmonary vascular congestion, small bilateral pleural effusions, and bibasilar atelectasis/consolidation. Electronically Signed   By: Guadlupe SpanishPraneil  Patel M.D.   On: 03/20/2019 11:20   [place] Thoracentesis Asp Pleural Space W/img Guide  Result Date: 03/22/2019 INDICATION: Shortness of breath.  Pleural effusions. EXAM: ULTRASOUND GUIDED RIGHT THORACENTESIS MEDICATIONS: None. COMPLICATIONS: None immediate. PROCEDURE: An ultrasound guided thoracentesis was thoroughly discussed with the patient's daughter on the telephone. The benefits, risks, alternatives and complications were also discussed. Informed consent was obtained. Ultrasound was performed to localize and mark an adequate pocket of fluid in the right  chest. The area was then prepped and draped in the normal sterile fashion. 1% Lidocaine was used for local anesthesia. Under ultrasound guidance a 6 Fr Safe-T-Centesis catheter was introduced. Thoracentesis was performed. The catheter was removed and a dressing applied. FINDINGS: A total of approximately 950 mL of yellow fluid was removed. Samples were sent to the laboratory as requested by the clinical team. IMPRESSION: Successful ultrasound guided right thoracentesis yielding 950 mL of pleural fluid. Electronically Signed   By: Richarda Overlie M.D.   On: 03/22/2019 16:22    Microbiology: Recent Results (from the past 240 hour(s))  SARS CORONAVIRUS 2 (TAT 6-24 HRS) Nasopharyngeal Nasopharyngeal Swab     Status: None    Collection Time: 03/20/19 10:45 AM   Specimen: Nasopharyngeal Swab  Result Value Ref Range Status   SARS Coronavirus 2 NEGATIVE NEGATIVE Final    Comment: (NOTE) SARS-CoV-2 target nucleic acids are NOT DETECTED. The SARS-CoV-2 RNA is generally detectable in upper and lower respiratory specimens during the acute phase of infection. Negative results do not preclude SARS-CoV-2 infection, do not rule out co-infections with other pathogens, and should not be used as the sole basis for treatment or other patient management decisions. Negative results must be combined with clinical observations, patient history, and epidemiological information. The expected result is Negative. Fact Sheet for Patients: HairSlick.no Fact Sheet for Healthcare Providers: quierodirigir.com This test is not yet approved or cleared by the [place] FDA and  has been authorized for detection and/or diagnosis of SARS-CoV-2 by FDA under an Emergency Use Authorization (EUA). This EUA will remain  in effect (meaning this test can be used) for the duration of the COVID-19 declaration under Section 56 4(b)(1) of the Act, 21 U.S.C. section 360bbb-3(b)(1), unless the authorization is terminated or revoked sooner. Performed at [facility], [address] [place], [place] [address]   Blood culture (routine x 2)     Status: None   Collection Time: 03/20/19 10:45 AM   Specimen: BLOOD  Result Value Ref Range Status   Specimen Description BLOOD BLOOD LEFT HAND  Final   Special Requests   Final    BOTTLES DRAWN AEROBIC AND ANAEROBIC Blood Culture adequate volume   Culture   Final    NO GROWTH 5 DAYS Performed at [facility], [address] [place], [place] [address]    Report Status 03/25/2019 FINAL  Final  Blood culture (routine x 2)     Status: None   Collection Time: 03/20/19 10:45 AM   Specimen: BLOOD  Result Value Ref Range Status    Specimen Description BLOOD LEFT ANTECUBITAL  Final   Special Requests   Final    BOTTLES DRAWN AEROBIC ONLY Blood Culture adequate volume   Culture   Final    NO GROWTH 5 DAYS Performed at [facility], [address] [place], [place] [address]    Report Status 03/25/2019 FINAL  Final  Body fluid culture     Status: None   Collection Time: 03/22/19  4:00 PM   Specimen: A: PATH Cytology Peritoneal fluid   B: PATH Cytology Pleural fluid  Result Value Ref Range Status   Specimen Description   Final    PERITONEAL Performed at [facility], [address], [place], [place] [address]    Special Requests   Final    PERITONEAL Performed at [facility], [address], [place], [place] [address]    Gram Stain   Final  RARE WBC PRESENT, PREDOMINANTLY MONONUCLEAR NO ORGANISMS SEEN    Culture   Final    NO GROWTH 3 DAYS Performed at [facility], [address], [place], [place] [address]    Report Status 03/26/2019 FINAL  Final  MRSA PCR Screening     Status: Abnormal   Collection Time: 03/22/19  4:52 PM   Specimen: Nasal Mucosa; Nasopharyngeal  Result Value Ref Range Status   MRSA by PCR POSITIVE (A) NEGATIVE Final    Comment:        The GeneXpert MRSA Assay (FDA approved for NASAL specimens only), is one component of a comprehensive MRSA colonization surveillance program. It is not intended to diagnose MRSA infection nor to guide or monitor treatment for MRSA infections. RESULT CALLED TO, READ BACK BY AND VERIFIED WITH: [facility] DUGGINS AT 1822 03/22/2019  TFK Performed at [facility], [address] [place], [place] [address]      Labs: Basic Metabolic Panel: Recent Labs  Lab 03/22/19 0555 03/23/19 0558 03/24/19 0622 03/25/19 0531 03/26/19 0626  NA 143 144 144 148* 143  K 3.8 3.5 3.4* 4.0 3.9  CL 105 106 101 103 98  CO2 32 35*  GLUCOSE 209* 125* 154* 102* 165*  BUN 25* 24* 22 24* 25*  CREATININE 1.03*  0.98 0.94 0.99 1.09*  CALCIUM 8.6* 8.5* 8.5* 8.1* 8.5*  MG 2.0 1.8  --   --   --   PHOS  --  2.8  --   --   --    Liver Function Tests: Recent Labs  Lab 03/20/19 0711 03/21/19 0549 03/23/19 0558  AST ALT ALKPHOS 79 64 59  BILITOT 0.8 0.6 0.6  PROT 6.4* 5.4* 5.6*  ALBUMIN 3.0* 2.5* 2.6*   No results for input(s): LIPASE, AMYLASE in the last 168 hours. No results for input(s): AMMONIA in the last 168 hours. CBC: Recent Labs  Lab 03/22/19 0442 03/23/19 0558 03/24/19 0622 03/25/19 0531 03/26/19 0626  WBC 9.4 7.9 9.6 8.8 8.5  HGB 11.8* 10.8* 11.4* 11.6* 11.4*  HCT 35.6* 33.5* 34.3* 35.6* 35.9*  MCV 98.6 101.8* 97.2 98.3 101.1*  PLT 106* 95* 114* 109* 89*    Recent Labs    03/20/19 1638 03/22/19 0442 03/25/19 0531  BNP 453.0* 617.0* 398.0*   CBG: Recent Labs  Lab 03/25/19 1130 03/25/19 1705 03/25/19 2109 03/26/19 0734 03/26/19 1106  GLUCAP 207* 314* 179* 187* 209*    Principal Problem:   Atrial fibrillation with RVR (HCC) Active Problems:   Hypokalemia   Syncope   Hypoglycemia due to insulin   Type II diabetes mellitus with renal manifestations (HCC)   CKD stage G3a/A1, GFR 45-59 and albumin creatinine ratio <30 mg/g   Cellulitis of left leg   Hypothyroidism   GERD (gastroesophageal reflux disease)   Hypoglycemia   Thrombocytopenia (HCC)   Acute on chronic diastolic CHF (congestive heart failure) (HCC)   COPD (chronic obstructive pulmonary disease) (HCC)   Acute on chronic respiratory failure with hypoxia (HCC)   Time coordinating discharge: 35 minutes  Signed:  Brendia Sacks, MD  [facility]  03/26/2019, 12:41 PM

## 2019-03-27 ENCOUNTER — Telehealth: Payer: Self-pay

## 2019-03-27 NOTE — Telephone Encounter (Signed)
-----   Message from Nelva Bush, MD sent at 03/26/2019 11:11 AM EST ----- Regarding: Hospital f/u Hello,  Could you help arrange for f/u visit (not TOC) with Dr. Rockey Situ or an APP in ~2 weeks?  Thanks.  Gerald Stabs

## 2019-03-27 NOTE — Telephone Encounter (Signed)
Attempted to schedule. Declined at this time per provider.   Patient is with [facility] and they manage patient care.  Per Datrice RN, provider Dr. Lavone Neri states they will manage cardiology care there for hospital fu care.    [facility] program works like [place] for billing so without an [employer] number and DOS we would also not be able to [employer].

## 2019-04-03 NOTE — Progress Notes (Deleted)
   Patient ID: Laurie Ross, female    DOB: 1940/12/09, 78 y.o.   MRN: [id number]  HPI  [name] is a 78 y/o female with a history of  Echo report from 03/21/2019 reviewed and showed an EF of 55-60% along with mild/moderate TR and severely elevated PA pressure of 78.2 mmHg.   Admitted 03/20/2019 due to acute on chronic HF along with atrial fibrillation with RVR. Cardiology consult obtained. Given IV diltiazem and heparin initially. Medications adjusted. Discharged after 6 days with [facility].   She presents today for her initial visit with a chief complaint of   Review of Systems    Physical Exam    Assessment & Plan:  1: Chronic heart failure with preserved ejection fraction- - NYHA class  2: HTN- - BP  3: DM-  4: COPD-

## 2019-04-04 ENCOUNTER — Ambulatory Visit: Payer: Medicare (Managed Care) | Admitting: Family

## 2019-08-24 DEATH — deceased

## 2020-05-17 IMAGING — DX PORTABLE CHEST - 1 VIEW
1 series · 1 of 1 positions shown · non-contrast
Comparison: November 25, 2015

CLINICAL DATA: Altered mental status.  Hypertension.

EXAM:
PORTABLE CHEST 1 VIEW

[chest ap]
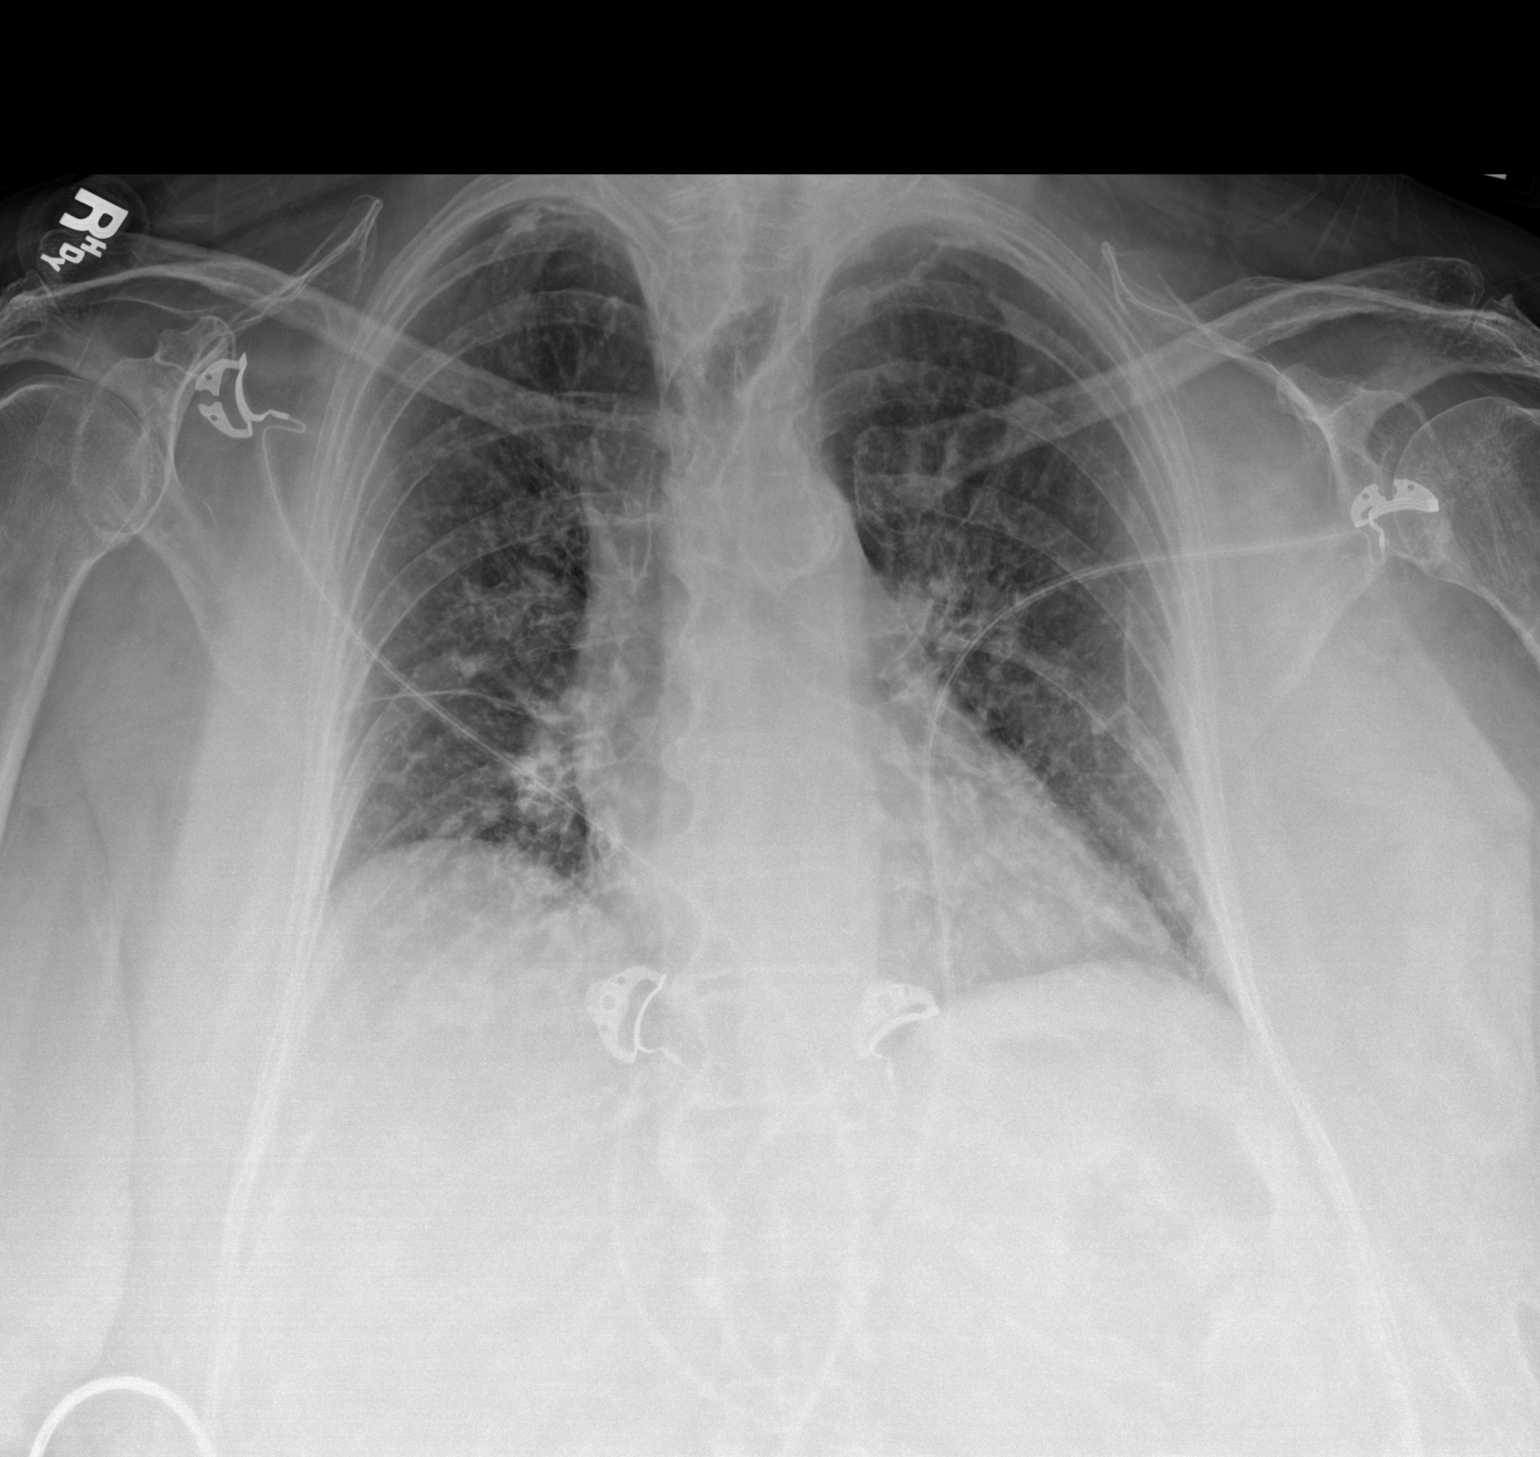

[1 of 1 positions shown; findings below may reference images not displayed]

FINDINGS: There is mild scarring in the left mid lung. There is no edema or
consolidation. Heart is upper normal in size with pulmonary
vascularity normal. No adenopathy. There is aortic atherosclerosis.
There is calcification in each carotid artery. No bone lesions.
IMPRESSION: Mild scarring left mid lung. No edema or consolidation. Stable
cardiac silhouette.

There is bilateral carotid artery calcification. Aortic
Atherosclerosis ([id number]).

## 2020-10-16 IMAGING — US US RENAL
1 series · 14 of 20 positions shown · non-contrast
Comparison: None.

CLINICAL DATA: Oliguria.

EXAM:
RENAL / URINARY TRACT ULTRASOUND COMPLETE

[Series 1: us renal · 14 of 20 slices shown]
[im 1/20]
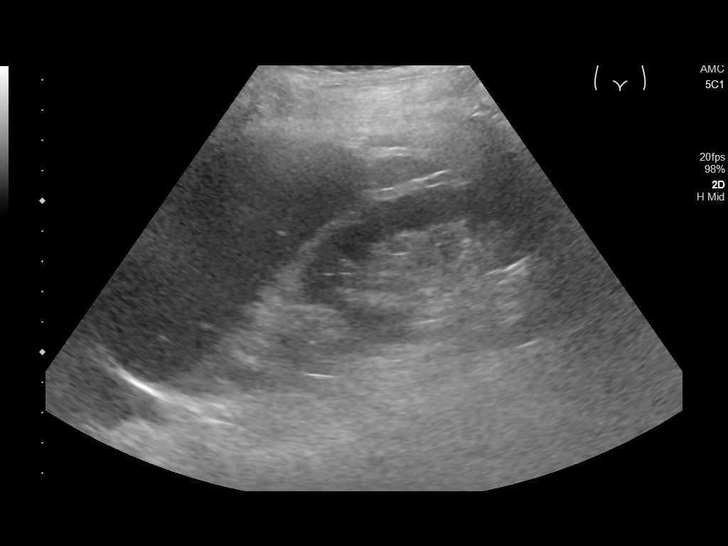
[im 3/20]
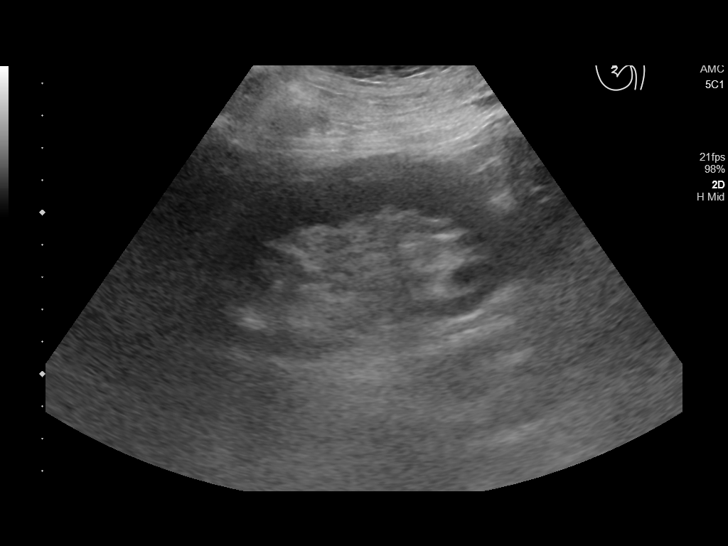
[im 4/20]
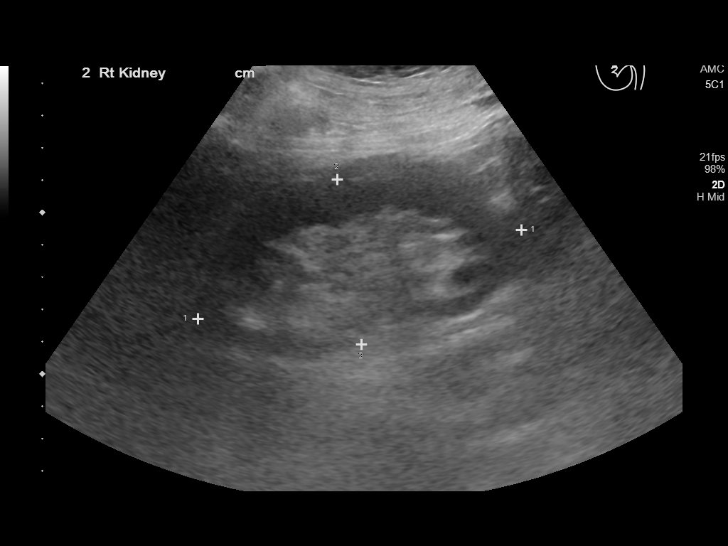
[im 6/20]
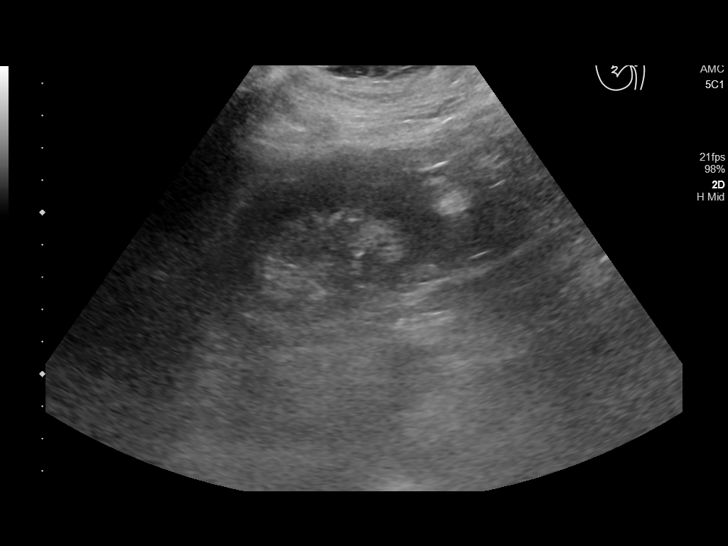
[im 7/20]
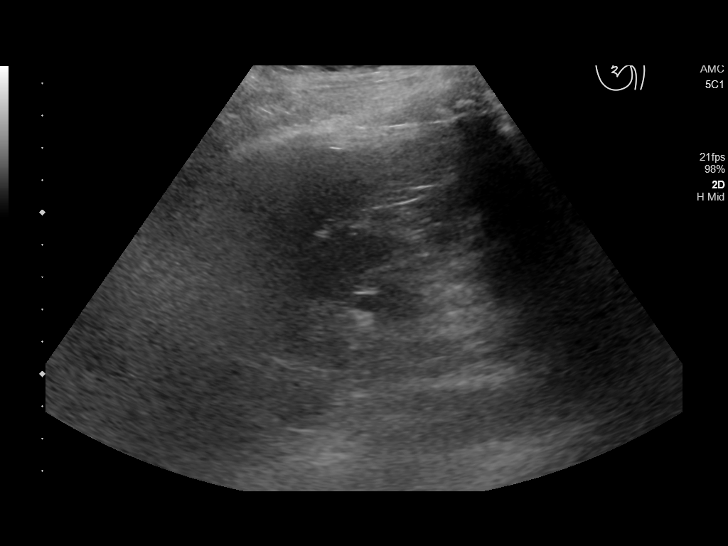
[im 8/20]
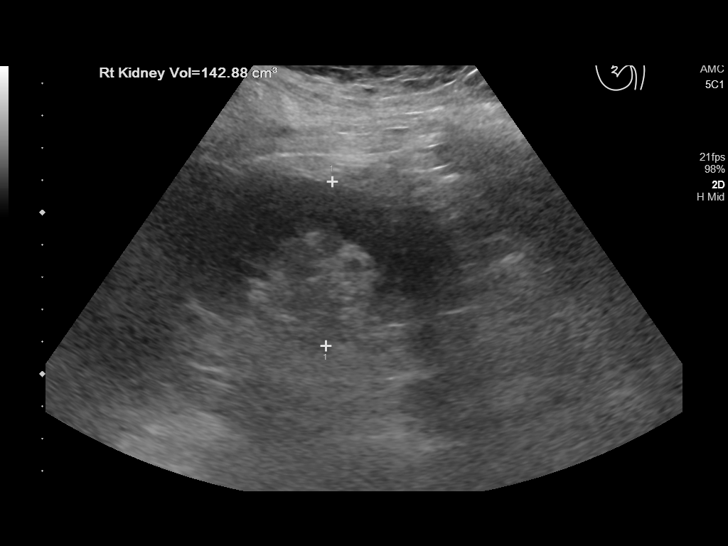
[im 10/20]
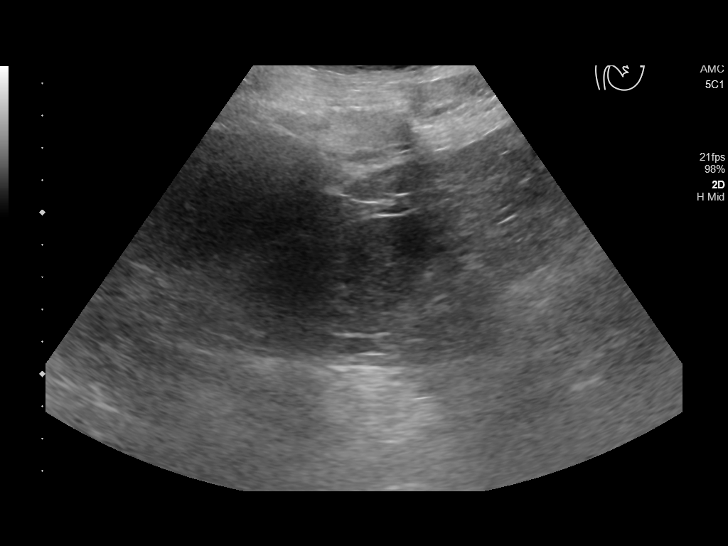
[im 11/20]
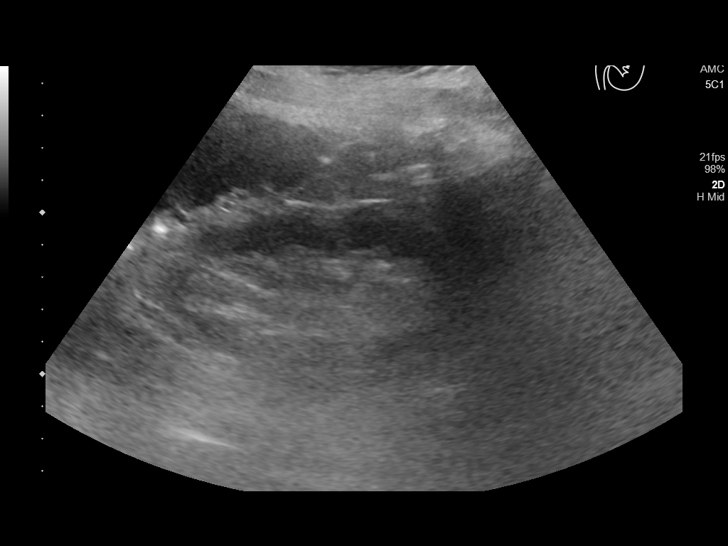
[im 13/20]
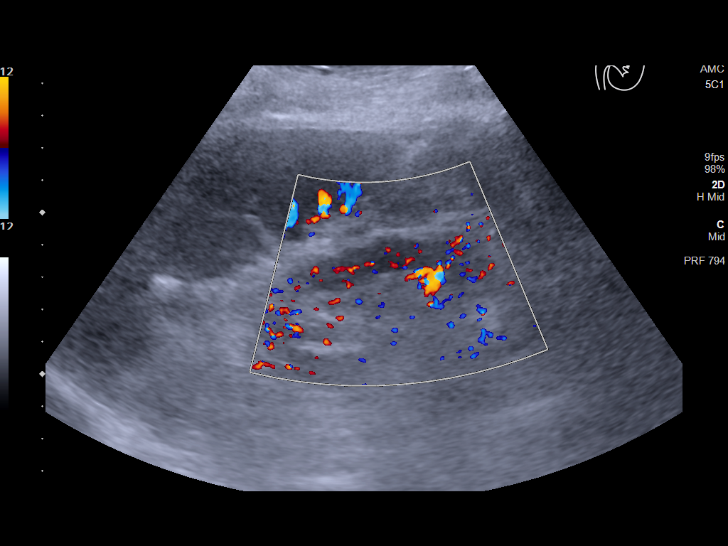
[im 14/20]
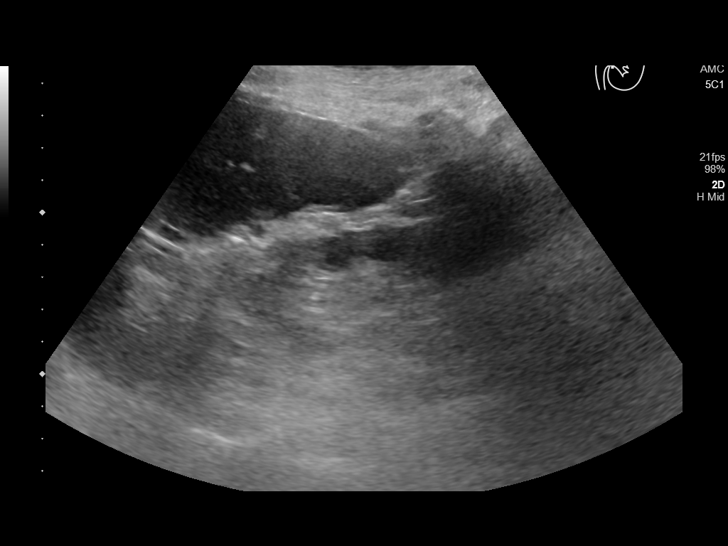
[im 16/20]
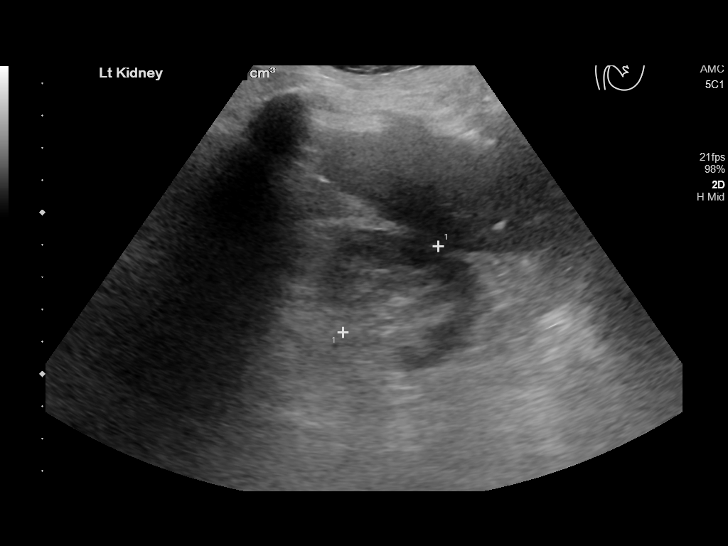
[im 17/20]
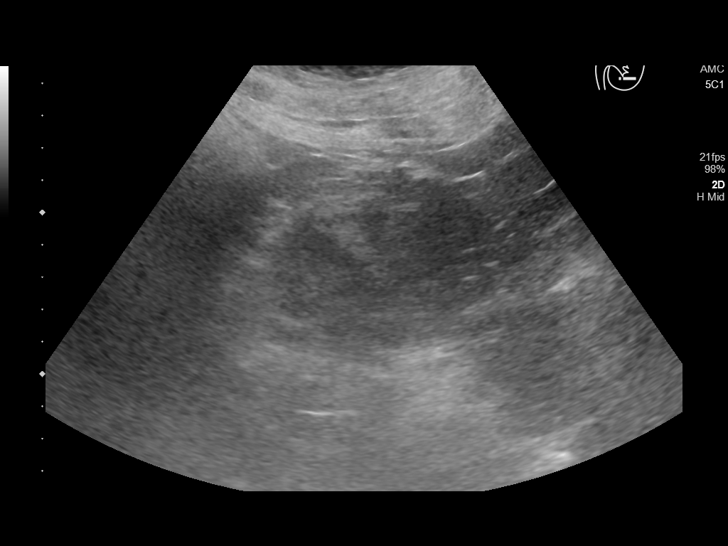
[im 18/20]
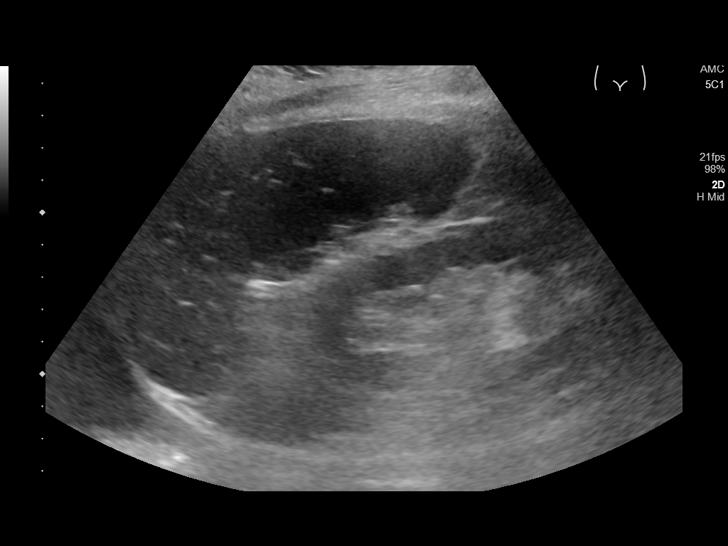
[im 20/20]
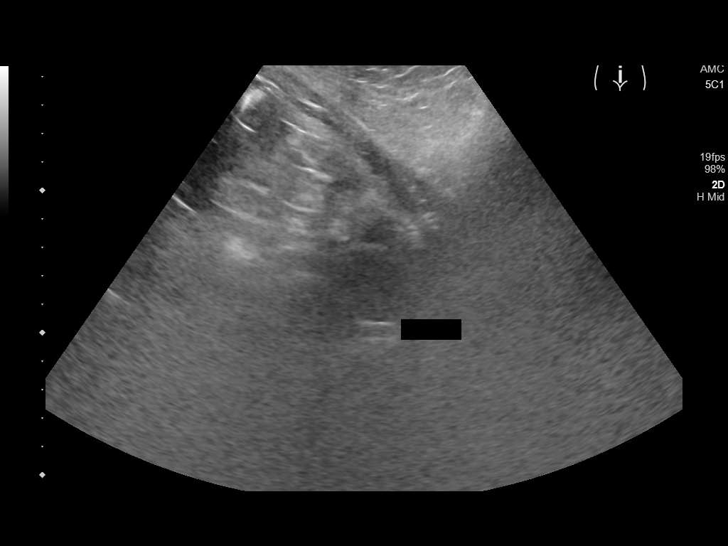

[14 of 20 positions shown; findings below may reference images not displayed]

FINDINGS: Right Kidney:

Renal measurements: 10.4 x 5.2 x 5.1 cm = volume: 143 mL .
Echogenicity within normal limits. No mass or hydronephrosis
visualized.

Left Kidney:

Renal measurements: 10.1 x 4.8 x 4.0 cm = volume: 101 mL.
Echogenicity within normal limits. No mass or hydronephrosis
visualized.

Bladder:

Decompressed secondary to Foley catheter.

Other:

None.
IMPRESSION: Normal renal ultrasound.

## 2020-10-16 IMAGING — DX DG CHEST 1V PORT
1 series · 1 of 1 positions shown · non-contrast
Comparison: 03/22/2019, [DATE] a.m.

CLINICAL DATA: Status post thoracentesis

EXAM:
PORTABLE CHEST 1 VIEW

[chest ap]
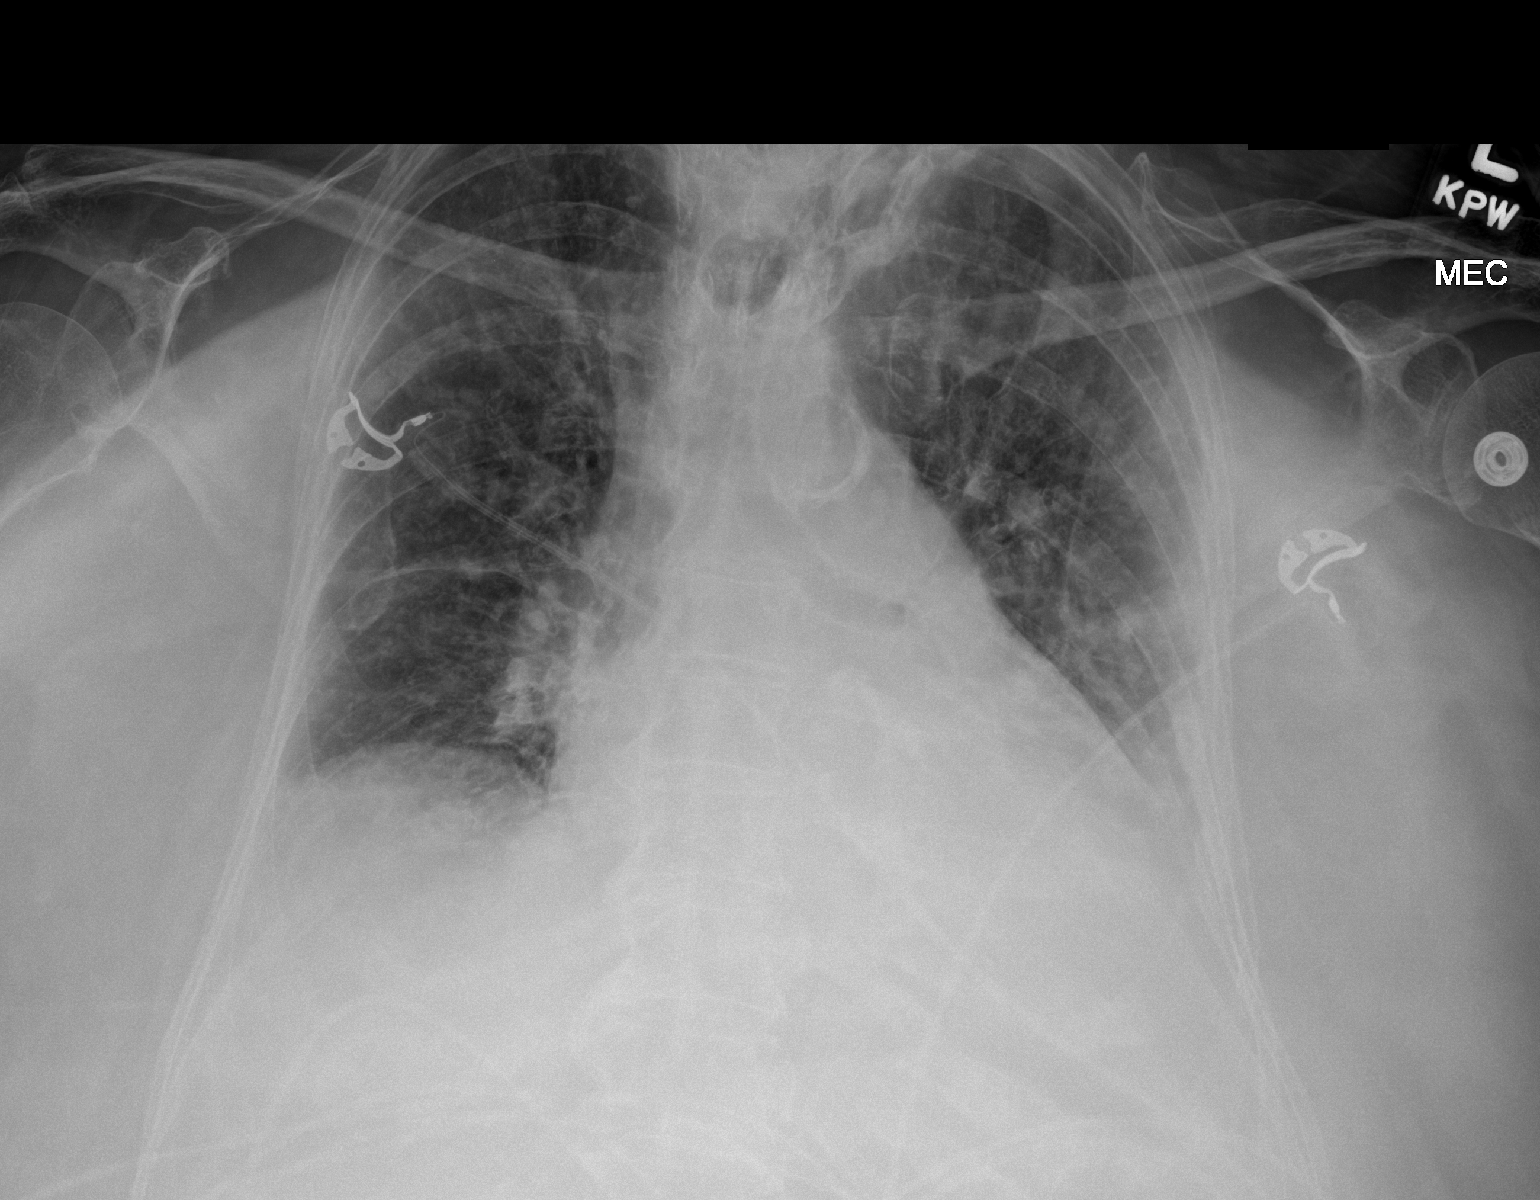

[1 of 1 positions shown; findings below may reference images not displayed]

FINDINGS: Status post right thoracentesis with near complete resolution of a
small right pleural effusion. No significant pneumothorax. Unchanged
small left pleural effusion. Unchanged diffuse interstitial opacity,
likely edema. Cardiomegaly.
IMPRESSION: Status post right thoracentesis with near complete resolution of a
small right pleural effusion. No significant pneumothorax.

## 2020-10-16 IMAGING — DX DG CHEST 1V
1 series · 1 of 1 positions shown · non-contrast
Comparison: 03/20/2019

CLINICAL DATA: Shortness of breath

EXAM:
CHEST  1 VIEW

[chest ap]
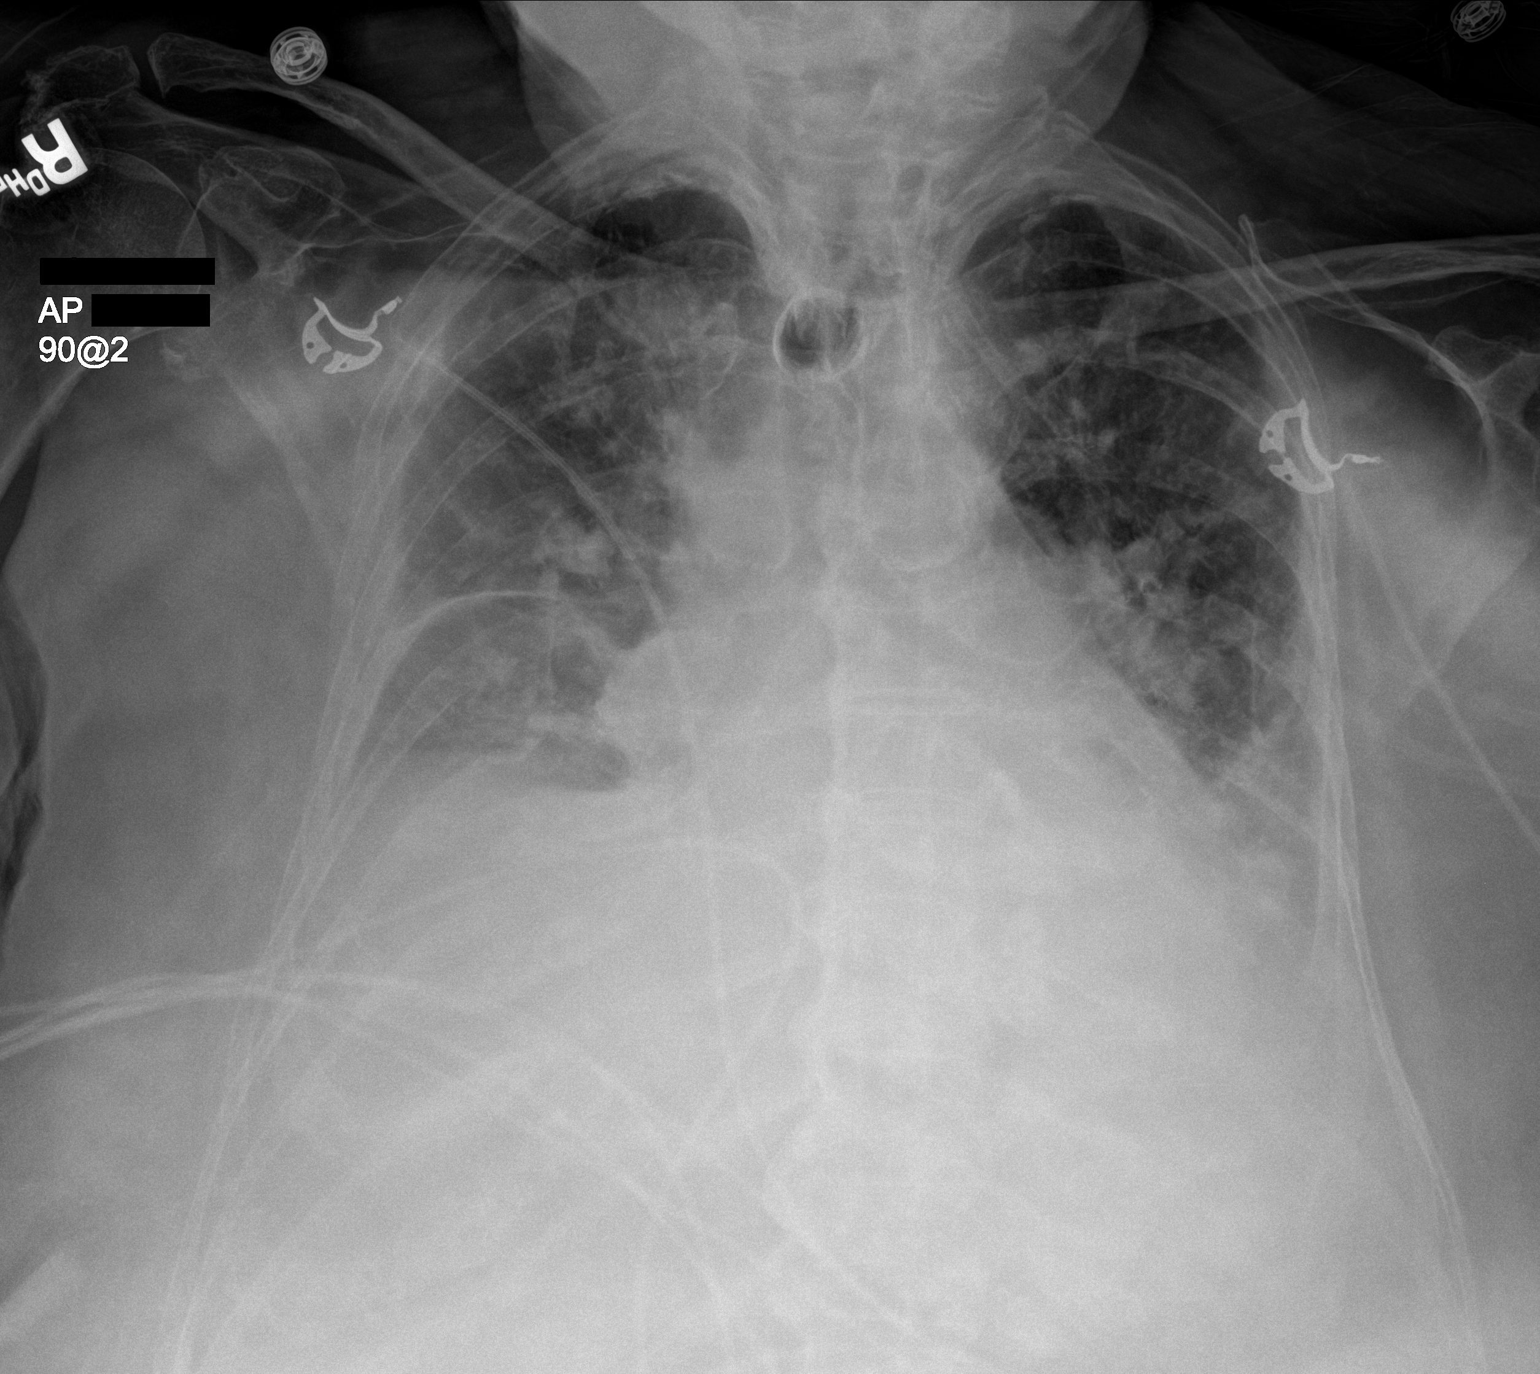

[1 of 1 positions shown; findings below may reference images not displayed]

FINDINGS: Shallow lung inflation with unchanged multifocal bilateral airspace
opacities. Cardiomediastinal contours are unchanged. Small pleural
effusions.
IMPRESSION: Unchanged examination with bilateral opacities and small pleural
effusions.
